# Patient Record
Sex: Female | Born: 1940
Health system: Southern US, Community
[De-identification: ages and names within clinical notes are randomized; demographics above are authoritative.]

## PROBLEM LIST (undated history)

## (undated) DIAGNOSIS — E785 Hyperlipidemia, unspecified: Secondary | ICD-10-CM

## (undated) DIAGNOSIS — T458X5A Adverse effect of other primarily systemic and hematological agents, initial encounter: Secondary | ICD-10-CM

## (undated) DIAGNOSIS — I10 Essential (primary) hypertension: Secondary | ICD-10-CM

## (undated) DIAGNOSIS — F419 Anxiety disorder, unspecified: Secondary | ICD-10-CM

## (undated) DIAGNOSIS — Z85820 Personal history of malignant melanoma of skin: Secondary | ICD-10-CM

## (undated) DIAGNOSIS — T783XXA Angioneurotic edema, initial encounter: Secondary | ICD-10-CM

## (undated) DIAGNOSIS — I7300 Raynaud's syndrome without gangrene: Secondary | ICD-10-CM

## (undated) DIAGNOSIS — K219 Gastro-esophageal reflux disease without esophagitis: Secondary | ICD-10-CM

## (undated) HISTORY — DX: Angioneurotic edema, initial encounter: T78.3XXA

## (undated) HISTORY — DX: Raynaud's syndrome without gangrene: I73.00

## (undated) HISTORY — DX: Adverse effect of other primarily systemic and hematological agents, initial encounter: T45.8X5A

## (undated) HISTORY — DX: Hypercalcemia: E83.52

## (undated) HISTORY — DX: Personal history of malignant melanoma of skin: Z85.820

## (undated) HISTORY — DX: Hyperlipidemia, unspecified: E78.5

## (undated) HISTORY — DX: Gastro-esophageal reflux disease without esophagitis: K21.9

## (undated) HISTORY — DX: Essential (primary) hypertension: I10

## (undated) HISTORY — DX: Anxiety disorder, unspecified: F41.9

## (undated) HISTORY — PX: ABDOMINAL HYSTERECTOMY: SHX81

## (undated) HISTORY — DX: Hypomagnesemia: E83.42

---

## 1998-10-21 ENCOUNTER — Ambulatory Visit (HOSPITAL_COMMUNITY): Admission: RE | Admit: 1998-10-21 | Discharge: 1998-10-21 | Payer: Self-pay | Admitting: Gastroenterology

## 1999-03-07 ENCOUNTER — Emergency Department (HOSPITAL_COMMUNITY): Admission: EM | Admit: 1999-03-07 | Discharge: 1999-03-07 | Payer: Self-pay | Admitting: Emergency Medicine

## 2002-11-10 ENCOUNTER — Emergency Department (HOSPITAL_COMMUNITY): Admission: EM | Admit: 2002-11-10 | Discharge: 2002-11-10 | Payer: Self-pay | Admitting: Emergency Medicine

## 2002-11-17 ENCOUNTER — Encounter: Admission: RE | Admit: 2002-11-17 | Discharge: 2002-11-17 | Payer: Self-pay | Admitting: Infectious Diseases

## 2002-12-08 ENCOUNTER — Encounter: Admission: RE | Admit: 2002-12-08 | Discharge: 2002-12-08 | Payer: Self-pay | Admitting: Internal Medicine

## 2002-12-15 ENCOUNTER — Encounter: Admission: RE | Admit: 2002-12-15 | Discharge: 2002-12-15 | Payer: Self-pay | Admitting: Internal Medicine

## 2002-12-22 ENCOUNTER — Encounter: Admission: RE | Admit: 2002-12-22 | Discharge: 2002-12-22 | Payer: Self-pay | Admitting: Internal Medicine

## 2003-03-17 ENCOUNTER — Encounter: Admission: RE | Admit: 2003-03-17 | Discharge: 2003-03-17 | Payer: Self-pay | Admitting: Infectious Diseases

## 2003-03-19 ENCOUNTER — Encounter: Admission: RE | Admit: 2003-03-19 | Discharge: 2003-03-19 | Payer: Self-pay | Admitting: Internal Medicine

## 2003-03-26 ENCOUNTER — Encounter: Admission: RE | Admit: 2003-03-26 | Discharge: 2003-03-26 | Payer: Self-pay | Admitting: Internal Medicine

## 2003-04-05 ENCOUNTER — Ambulatory Visit (HOSPITAL_COMMUNITY): Admission: RE | Admit: 2003-04-05 | Discharge: 2003-04-05 | Payer: Self-pay | Admitting: Internal Medicine

## 2004-01-18 ENCOUNTER — Encounter: Admission: RE | Admit: 2004-01-18 | Discharge: 2004-01-18 | Payer: Self-pay | Admitting: Internal Medicine

## 2004-02-11 ENCOUNTER — Ambulatory Visit (HOSPITAL_COMMUNITY): Admission: RE | Admit: 2004-02-11 | Discharge: 2004-02-11 | Payer: Self-pay | Admitting: *Deleted

## 2004-03-07 ENCOUNTER — Ambulatory Visit (HOSPITAL_COMMUNITY): Admission: RE | Admit: 2004-03-07 | Discharge: 2004-03-07 | Payer: Self-pay | Admitting: Internal Medicine

## 2004-03-09 ENCOUNTER — Encounter: Admission: RE | Admit: 2004-03-09 | Discharge: 2004-03-09 | Payer: Self-pay | Admitting: Internal Medicine

## 2004-04-26 ENCOUNTER — Ambulatory Visit: Payer: Self-pay | Admitting: Internal Medicine

## 2004-05-10 ENCOUNTER — Ambulatory Visit (HOSPITAL_COMMUNITY): Admission: RE | Admit: 2004-05-10 | Discharge: 2004-05-10 | Payer: Self-pay | Admitting: Internal Medicine

## 2004-05-18 ENCOUNTER — Encounter (INDEPENDENT_AMBULATORY_CARE_PROVIDER_SITE_OTHER): Payer: Self-pay | Admitting: Unknown Physician Specialty

## 2004-05-18 ENCOUNTER — Ambulatory Visit: Payer: Self-pay | Admitting: Internal Medicine

## 2004-05-29 ENCOUNTER — Ambulatory Visit: Payer: Self-pay | Admitting: Internal Medicine

## 2004-09-05 ENCOUNTER — Ambulatory Visit: Payer: Self-pay | Admitting: Internal Medicine

## 2004-09-21 ENCOUNTER — Ambulatory Visit (HOSPITAL_COMMUNITY): Admission: RE | Admit: 2004-09-21 | Discharge: 2004-09-21 | Payer: Self-pay | Admitting: Internal Medicine

## 2004-11-14 ENCOUNTER — Ambulatory Visit: Payer: Self-pay | Admitting: Internal Medicine

## 2004-11-14 ENCOUNTER — Other Ambulatory Visit: Admission: RE | Admit: 2004-11-14 | Discharge: 2004-11-14 | Payer: Self-pay | Admitting: Internal Medicine

## 2004-11-15 DIAGNOSIS — C439 Malignant melanoma of skin, unspecified: Secondary | ICD-10-CM | POA: Insufficient documentation

## 2004-11-21 ENCOUNTER — Ambulatory Visit: Payer: Self-pay | Admitting: Internal Medicine

## 2004-11-22 ENCOUNTER — Other Ambulatory Visit: Admission: RE | Admit: 2004-11-22 | Discharge: 2004-11-22 | Payer: Self-pay | Admitting: Internal Medicine

## 2004-12-05 ENCOUNTER — Ambulatory Visit: Payer: Self-pay | Admitting: Internal Medicine

## 2005-02-16 ENCOUNTER — Ambulatory Visit: Payer: Self-pay | Admitting: Internal Medicine

## 2005-04-10 ENCOUNTER — Ambulatory Visit: Payer: Self-pay | Admitting: Internal Medicine

## 2005-05-11 ENCOUNTER — Encounter (INDEPENDENT_AMBULATORY_CARE_PROVIDER_SITE_OTHER): Payer: Self-pay | Admitting: Unknown Physician Specialty

## 2005-05-11 ENCOUNTER — Ambulatory Visit (HOSPITAL_COMMUNITY): Admission: RE | Admit: 2005-05-11 | Discharge: 2005-05-11 | Payer: Self-pay | Admitting: Internal Medicine

## 2005-07-02 ENCOUNTER — Ambulatory Visit: Payer: Self-pay | Admitting: Internal Medicine

## 2005-07-02 ENCOUNTER — Encounter (INDEPENDENT_AMBULATORY_CARE_PROVIDER_SITE_OTHER): Payer: Self-pay | Admitting: Unknown Physician Specialty

## 2005-07-11 ENCOUNTER — Ambulatory Visit: Payer: Self-pay | Admitting: Internal Medicine

## 2005-11-14 ENCOUNTER — Ambulatory Visit: Payer: Self-pay | Admitting: Internal Medicine

## 2006-06-13 DIAGNOSIS — E785 Hyperlipidemia, unspecified: Secondary | ICD-10-CM | POA: Insufficient documentation

## 2006-06-13 DIAGNOSIS — I10 Essential (primary) hypertension: Secondary | ICD-10-CM | POA: Insufficient documentation

## 2006-06-13 DIAGNOSIS — R12 Heartburn: Secondary | ICD-10-CM | POA: Insufficient documentation

## 2006-06-13 DIAGNOSIS — F419 Anxiety disorder, unspecified: Secondary | ICD-10-CM | POA: Insufficient documentation

## 2006-06-13 DIAGNOSIS — F411 Generalized anxiety disorder: Secondary | ICD-10-CM | POA: Insufficient documentation

## 2006-06-13 DIAGNOSIS — K219 Gastro-esophageal reflux disease without esophagitis: Secondary | ICD-10-CM | POA: Insufficient documentation

## 2006-06-13 DIAGNOSIS — M810 Age-related osteoporosis without current pathological fracture: Secondary | ICD-10-CM | POA: Insufficient documentation

## 2006-06-13 DIAGNOSIS — M818 Other osteoporosis without current pathological fracture: Secondary | ICD-10-CM

## 2006-06-13 HISTORY — DX: Other osteoporosis without current pathological fracture: M81.8

## 2006-08-27 ENCOUNTER — Telehealth (INDEPENDENT_AMBULATORY_CARE_PROVIDER_SITE_OTHER): Payer: Self-pay | Admitting: *Deleted

## 2006-08-27 ENCOUNTER — Encounter (INDEPENDENT_AMBULATORY_CARE_PROVIDER_SITE_OTHER): Payer: Self-pay | Admitting: Unknown Physician Specialty

## 2006-08-27 ENCOUNTER — Ambulatory Visit (HOSPITAL_COMMUNITY): Admission: RE | Admit: 2006-08-27 | Discharge: 2006-08-27 | Payer: Self-pay | Admitting: Internal Medicine

## 2006-09-10 ENCOUNTER — Ambulatory Visit: Payer: Self-pay | Admitting: Internal Medicine

## 2006-09-10 ENCOUNTER — Ambulatory Visit (HOSPITAL_COMMUNITY): Admission: RE | Admit: 2006-09-10 | Discharge: 2006-09-10 | Payer: Self-pay | Admitting: Internal Medicine

## 2006-09-10 ENCOUNTER — Encounter (INDEPENDENT_AMBULATORY_CARE_PROVIDER_SITE_OTHER): Payer: Self-pay | Admitting: Unknown Physician Specialty

## 2006-09-10 LAB — CONVERTED CEMR LAB
ALT: 11 units/L (ref 0–35)
AST: 15 units/L (ref 0–37)
Albumin: 4.8 g/dL (ref 3.5–5.2)
Alkaline Phosphatase: 95 units/L (ref 39–117)
BUN: 20 mg/dL (ref 6–23)
Bilirubin Urine: NEGATIVE
CO2: 25 meq/L (ref 19–32)
Calcium: 10.1 mg/dL (ref 8.4–10.5)
Chloride: 98 meq/L (ref 96–112)
Cholesterol: 200 mg/dL (ref 0–200)
Creatinine, Ser: 1.19 mg/dL (ref 0.40–1.20)
Glucose, Bld: 91 mg/dL (ref 70–99)
HDL: 49 mg/dL (ref >39)
Hemoglobin, Urine: NEGATIVE
Ketones, ur: NEGATIVE mg/dL
LDL Cholesterol: 104 mg/dL — ABNORMAL HIGH (ref 0–99)
Nitrite: NEGATIVE
Potassium: 4.5 meq/L (ref 3.5–5.3)
Protein, ur: NEGATIVE mg/dL
RBC / HPF: NONE SEEN (ref <3)
Sed Rate: 28 mm/hr — ABNORMAL HIGH (ref 0–22)
Sodium: 137 meq/L (ref 135–145)
Specific Gravity, Urine: 1.009 (ref 1.005–1.03)
TSH: 1.905 microintl units/mL (ref 0.350–5.50)
Total Bilirubin: 0.4 mg/dL (ref 0.3–1.2)
Total CHOL/HDL Ratio: 4.1
Total Protein: 8 g/dL (ref 6.0–8.3)
Triglycerides: 233 mg/dL — ABNORMAL HIGH (ref <150)
Urine Glucose: NEGATIVE mg/dL
Urobilinogen, UA: 0.2 (ref 0.0–1.0)
VLDL: 47 mg/dL — ABNORMAL HIGH (ref 0–40)
pH: 7.5 (ref 5.0–8.0)

## 2006-12-19 ENCOUNTER — Ambulatory Visit: Payer: Self-pay | Admitting: Internal Medicine

## 2006-12-19 ENCOUNTER — Encounter (INDEPENDENT_AMBULATORY_CARE_PROVIDER_SITE_OTHER): Payer: Self-pay | Admitting: Unknown Physician Specialty

## 2006-12-19 LAB — CONVERTED CEMR LAB
Cholesterol: 191 mg/dL (ref 0–200)
HDL: 49 mg/dL (ref >39)
LDL Cholesterol: 108 mg/dL — ABNORMAL HIGH (ref 0–99)
Total CHOL/HDL Ratio: 3.9
Triglycerides: 172 mg/dL — ABNORMAL HIGH (ref <150)
VLDL: 34 mg/dL (ref 0–40)

## 2007-01-23 ENCOUNTER — Ambulatory Visit: Payer: Self-pay | Admitting: Internal Medicine

## 2007-01-23 ENCOUNTER — Encounter (INDEPENDENT_AMBULATORY_CARE_PROVIDER_SITE_OTHER): Payer: Self-pay | Admitting: Unknown Physician Specialty

## 2007-01-23 ENCOUNTER — Telehealth: Payer: Self-pay | Admitting: *Deleted

## 2007-01-23 LAB — CONVERTED CEMR LAB
ALT: 14 units/L (ref 0–35)
AST: 21 units/L (ref 0–37)
Albumin: 4.2 g/dL (ref 3.5–5.2)
Alkaline Phosphatase: 97 units/L (ref 39–117)
BUN: 15 mg/dL (ref 6–23)
CO2: 26 meq/L (ref 19–32)
Calcium: 10.2 mg/dL (ref 8.4–10.5)
Chloride: 95 meq/L — ABNORMAL LOW (ref 96–112)
Creatinine, Ser: 1.14 mg/dL (ref 0.40–1.20)
Glucose, Bld: 103 mg/dL — ABNORMAL HIGH (ref 70–99)
Magnesium: 1.6 mg/dL (ref 1.5–2.5)
Phosphorus: 2.9 mg/dL (ref 2.3–4.6)
Potassium: 4.1 meq/L (ref 3.5–5.3)
Sodium: 130 meq/L — ABNORMAL LOW (ref 135–145)
Total Bilirubin: 0.3 mg/dL (ref 0.3–1.2)
Total Protein: 7.2 g/dL (ref 6.0–8.3)

## 2007-01-27 ENCOUNTER — Ambulatory Visit (HOSPITAL_COMMUNITY): Admission: RE | Admit: 2007-01-27 | Discharge: 2007-01-27 | Payer: Self-pay | Admitting: Unknown Physician Specialty

## 2007-01-28 ENCOUNTER — Emergency Department (HOSPITAL_COMMUNITY): Admission: EM | Admit: 2007-01-28 | Discharge: 2007-01-28 | Payer: Self-pay | Admitting: Emergency Medicine

## 2007-01-28 ENCOUNTER — Encounter: Payer: Self-pay | Admitting: Internal Medicine

## 2007-01-28 ENCOUNTER — Telehealth: Payer: Self-pay | Admitting: *Deleted

## 2007-02-12 ENCOUNTER — Encounter: Payer: Self-pay | Admitting: Internal Medicine

## 2007-02-12 ENCOUNTER — Ambulatory Visit: Payer: Self-pay | Admitting: Internal Medicine

## 2007-02-12 DIAGNOSIS — J309 Allergic rhinitis, unspecified: Secondary | ICD-10-CM | POA: Insufficient documentation

## 2007-02-13 LAB — CONVERTED CEMR LAB
Albumin: 4.5 g/dL (ref 3.5–5.2)
BUN: 22 mg/dL (ref 6–23)
CO2: 26 meq/L (ref 19–32)
Calcium: 10.8 mg/dL — ABNORMAL HIGH (ref 8.4–10.5)
Chloride: 89 meq/L — ABNORMAL LOW (ref 96–112)
Creatinine, Ser: 1.32 mg/dL — ABNORMAL HIGH (ref 0.40–1.20)
Glucose, Bld: 99 mg/dL (ref 70–99)
Magnesium: 1.6 mg/dL (ref 1.5–2.5)
Phosphorus: 3.5 mg/dL (ref 2.3–4.6)
Potassium: 4.9 meq/L (ref 3.5–5.3)
Sodium: 129 meq/L — ABNORMAL LOW (ref 135–145)

## 2007-03-20 ENCOUNTER — Encounter: Payer: Self-pay | Admitting: Internal Medicine

## 2007-03-21 ENCOUNTER — Encounter: Payer: Self-pay | Admitting: Internal Medicine

## 2007-03-21 ENCOUNTER — Ambulatory Visit: Payer: Self-pay | Admitting: Infectious Disease

## 2007-03-21 LAB — CONVERTED CEMR LAB
ALT: 13 units/L (ref 0–35)
AST: 16 units/L (ref 0–37)
Albumin: 4.8 g/dL (ref 3.5–5.2)
Alkaline Phosphatase: 87 units/L (ref 39–117)
BUN: 16 mg/dL (ref 6–23)
Basophils Absolute: 0 10*3/uL (ref 0.0–0.1)
Basophils Relative: 0 % (ref 0–1)
CO2: 26 meq/L (ref 19–32)
Calcium: 10.4 mg/dL (ref 8.4–10.5)
Chloride: 95 meq/L — ABNORMAL LOW (ref 96–112)
Creatinine, Ser: 1.05 mg/dL (ref 0.40–1.20)
Eosinophils Absolute: 0.1 10*3/uL (ref 0.0–0.7)
Eosinophils Relative: 1 % (ref 0–5)
Glucose, Bld: 86 mg/dL (ref 70–99)
HCT: 36.2 % (ref 36.0–46.0)
Hemoglobin: 12.1 g/dL (ref 12.0–15.0)
Lymphocytes Relative: 22 % (ref 12–46)
Lymphs Abs: 2.2 10*3/uL (ref 0.7–3.3)
MCHC: 33.4 g/dL (ref 30.0–36.0)
MCV: 87.7 fL (ref 78.0–100.0)
Magnesium: 1.4 mg/dL — ABNORMAL LOW (ref 1.5–2.5)
Monocytes Absolute: 0.6 10*3/uL (ref 0.2–0.7)
Monocytes Relative: 6 % (ref 3–11)
Neutro Abs: 7.1 10*3/uL (ref 1.7–7.7)
Neutrophils Relative %: 71 % (ref 43–77)
Platelets: 412 10*3/uL — ABNORMAL HIGH (ref 150–400)
Potassium: 4.1 meq/L (ref 3.5–5.3)
RBC: 4.13 M/uL (ref 3.87–5.11)
RDW: 13.7 % (ref 11.5–14.0)
Sodium: 133 meq/L — ABNORMAL LOW (ref 135–145)
Total Bilirubin: 0.5 mg/dL (ref 0.3–1.2)
Total Protein: 7.8 g/dL (ref 6.0–8.3)
WBC: 10 10*3/uL (ref 4.0–10.5)

## 2007-05-16 ENCOUNTER — Encounter: Payer: Self-pay | Admitting: Internal Medicine

## 2007-05-16 ENCOUNTER — Ambulatory Visit (HOSPITAL_COMMUNITY): Admission: RE | Admit: 2007-05-16 | Discharge: 2007-05-16 | Payer: Self-pay | Admitting: Internal Medicine

## 2007-05-16 ENCOUNTER — Ambulatory Visit: Payer: Self-pay | Admitting: Internal Medicine

## 2007-05-16 DIAGNOSIS — I7300 Raynaud's syndrome without gangrene: Secondary | ICD-10-CM | POA: Insufficient documentation

## 2007-05-17 ENCOUNTER — Encounter: Payer: Self-pay | Admitting: Internal Medicine

## 2007-05-21 LAB — CONVERTED CEMR LAB
ALT: 11 units/L (ref 0–35)
AST: 15 units/L (ref 0–37)
Albumin: 4.8 g/dL (ref 3.5–5.2)
Alkaline Phosphatase: 82 units/L (ref 39–117)
Anti Nuclear Antibody(ANA): NEGATIVE
BUN: 20 mg/dL (ref 6–23)
Basophils Absolute: 0 10*3/uL (ref 0.0–0.1)
Basophils Relative: 0 % (ref 0–1)
Bilirubin Urine: NEGATIVE
CO2: 23 meq/L (ref 19–32)
Calcium: 10.7 mg/dL — ABNORMAL HIGH (ref 8.4–10.5)
Chloride: 97 meq/L (ref 96–112)
Creatinine, Ser: 1.13 mg/dL (ref 0.40–1.20)
Eosinophils Absolute: 0 10*3/uL (ref 0.0–0.7)
Eosinophils Relative: 0 % (ref 0–5)
Glucose, Bld: 90 mg/dL (ref 70–99)
HCT: 35.6 % — ABNORMAL LOW (ref 36.0–46.0)
Hemoglobin, Urine: NEGATIVE
Hemoglobin: 11.8 g/dL — ABNORMAL LOW (ref 12.0–15.0)
Ketones, ur: NEGATIVE mg/dL
Leukocytes, UA: NEGATIVE
Lymphocytes Relative: 20 % (ref 12–46)
Lymphs Abs: 2.1 10*3/uL (ref 0.7–3.3)
MCHC: 33.1 g/dL (ref 30.0–36.0)
MCV: 87.7 fL (ref 78.0–100.0)
Magnesium: 1.6 mg/dL (ref 1.5–2.5)
Monocytes Absolute: 0.6 10*3/uL (ref 0.2–0.7)
Monocytes Relative: 6 % (ref 3–11)
Neutro Abs: 7.7 10*3/uL (ref 1.7–7.7)
Neutrophils Relative %: 73 % (ref 43–77)
Nitrite: NEGATIVE
Phosphorus: 3.2 mg/dL (ref 2.3–4.6)
Platelets: 394 10*3/uL (ref 150–400)
Potassium: 4.1 meq/L (ref 3.5–5.3)
Protein, ur: NEGATIVE mg/dL
RBC: 4.06 M/uL (ref 3.87–5.11)
RDW: 13.5 % (ref 11.5–14.0)
Rhuematoid fact SerPl-aCnc: <20 intl units/mL (ref 0–20)
Sed Rate: 25 mm/hr — ABNORMAL HIGH (ref 0–22)
Sodium: 135 meq/L (ref 135–145)
Specific Gravity, Urine: 1.009 (ref 1.005–1.03)
Total Bilirubin: 0.4 mg/dL (ref 0.3–1.2)
Total Protein: 8.2 g/dL (ref 6.0–8.3)
Urine Glucose: NEGATIVE mg/dL
Urobilinogen, UA: 0.2 (ref 0.0–1.0)
WBC: 10.4 10*3/uL (ref 4.0–10.5)
pH: 7.5 (ref 5.0–8.0)

## 2007-06-03 ENCOUNTER — Telehealth: Payer: Self-pay | Admitting: Internal Medicine

## 2007-07-10 ENCOUNTER — Telehealth: Payer: Self-pay | Admitting: *Deleted

## 2007-09-11 ENCOUNTER — Encounter: Payer: Self-pay | Admitting: Internal Medicine

## 2007-09-11 ENCOUNTER — Ambulatory Visit: Payer: Self-pay | Admitting: Hospitalist

## 2007-09-16 ENCOUNTER — Ambulatory Visit (HOSPITAL_COMMUNITY): Admission: RE | Admit: 2007-09-16 | Discharge: 2007-09-16 | Payer: Self-pay | Admitting: Internal Medicine

## 2007-09-16 ENCOUNTER — Encounter: Payer: Self-pay | Admitting: Internal Medicine

## 2007-10-10 ENCOUNTER — Encounter: Payer: Self-pay | Admitting: Internal Medicine

## 2007-10-10 ENCOUNTER — Ambulatory Visit: Payer: Self-pay | Admitting: Hospitalist

## 2007-10-10 LAB — CONVERTED CEMR LAB
ALT: 9 units/L (ref 0–35)
AST: 14 units/L (ref 0–37)
Albumin: 4.8 g/dL (ref 3.5–5.2)
Alkaline Phosphatase: 98 units/L (ref 39–117)
BUN: 23 mg/dL (ref 6–23)
Bilirubin Urine: NEGATIVE
CO2: 20 meq/L (ref 19–32)
Calcium: 9.3 mg/dL (ref 8.4–10.5)
Chloride: 103 meq/L (ref 96–112)
Creatinine, Ser: 1.01 mg/dL (ref 0.40–1.20)
Glucose, Bld: 96 mg/dL (ref 70–99)
Hemoglobin, Urine: NEGATIVE
Ketones, ur: NEGATIVE mg/dL
Leukocytes, UA: NEGATIVE
Nitrite: NEGATIVE
Potassium: 4.5 meq/L (ref 3.5–5.3)
Protein, ur: NEGATIVE mg/dL
Sodium: 135 meq/L (ref 135–145)
Specific Gravity, Urine: 1.009 (ref 1.005–1.03)
Total Bilirubin: 0.3 mg/dL (ref 0.3–1.2)
Total Protein: 7.7 g/dL (ref 6.0–8.3)
Urine Glucose: NEGATIVE mg/dL
Urobilinogen, UA: 0.2 (ref 0.0–1.0)
Vit D, 1,25-Dihydroxy: 29 — ABNORMAL LOW (ref 30–89)
pH: 6 (ref 5.0–8.0)

## 2007-10-14 ENCOUNTER — Ambulatory Visit (HOSPITAL_COMMUNITY): Admission: RE | Admit: 2007-10-14 | Discharge: 2007-10-14 | Payer: Self-pay | Admitting: Internal Medicine

## 2007-10-24 ENCOUNTER — Telehealth: Payer: Self-pay | Admitting: Internal Medicine

## 2007-10-30 ENCOUNTER — Telehealth: Payer: Self-pay | Admitting: Internal Medicine

## 2007-12-01 ENCOUNTER — Telehealth: Payer: Self-pay | Admitting: Internal Medicine

## 2008-02-20 ENCOUNTER — Encounter: Payer: Self-pay | Admitting: Internal Medicine

## 2008-02-20 ENCOUNTER — Ambulatory Visit (HOSPITAL_COMMUNITY): Admission: RE | Admit: 2008-02-20 | Discharge: 2008-02-20 | Payer: Self-pay | Admitting: Internal Medicine

## 2008-02-20 ENCOUNTER — Ambulatory Visit: Payer: Self-pay | Admitting: Infectious Diseases

## 2008-02-20 LAB — CONVERTED CEMR LAB
ALT: 15 units/L (ref 0–35)
AST: 20 units/L (ref 0–37)
Albumin: 4.4 g/dL (ref 3.5–5.2)
Alkaline Phosphatase: 75 units/L (ref 39–117)
BUN: 22 mg/dL (ref 6–23)
CO2: 26 meq/L (ref 19–32)
Calcium: 10.2 mg/dL (ref 8.4–10.5)
Chloride: 91 meq/L — ABNORMAL LOW (ref 96–112)
Creatinine, Ser: 1.3 mg/dL — ABNORMAL HIGH (ref 0.40–1.20)
Glucose, Bld: 85 mg/dL (ref 70–99)
Potassium: 4.8 meq/L (ref 3.5–5.3)
Sodium: 126 meq/L — ABNORMAL LOW (ref 135–145)
Total Bilirubin: 0.9 mg/dL (ref 0.3–1.2)
Total Protein: 7.2 g/dL (ref 6.0–8.3)

## 2008-02-23 ENCOUNTER — Ambulatory Visit: Payer: Self-pay | Admitting: *Deleted

## 2008-02-23 ENCOUNTER — Encounter: Payer: Self-pay | Admitting: Internal Medicine

## 2008-02-24 ENCOUNTER — Ambulatory Visit (HOSPITAL_COMMUNITY): Admission: RE | Admit: 2008-02-24 | Discharge: 2008-02-24 | Payer: Self-pay | Admitting: *Deleted

## 2008-02-24 ENCOUNTER — Encounter (INDEPENDENT_AMBULATORY_CARE_PROVIDER_SITE_OTHER): Payer: Self-pay | Admitting: *Deleted

## 2008-03-04 ENCOUNTER — Encounter: Payer: Self-pay | Admitting: Internal Medicine

## 2008-03-10 ENCOUNTER — Telehealth: Payer: Self-pay | Admitting: *Deleted

## 2008-03-15 ENCOUNTER — Encounter (INDEPENDENT_AMBULATORY_CARE_PROVIDER_SITE_OTHER): Payer: Self-pay | Admitting: Internal Medicine

## 2008-03-15 ENCOUNTER — Telehealth (INDEPENDENT_AMBULATORY_CARE_PROVIDER_SITE_OTHER): Payer: Self-pay | Admitting: Internal Medicine

## 2008-03-15 LAB — CONVERTED CEMR LAB
Cholesterol: 232 mg/dL — ABNORMAL HIGH (ref 0–200)
HDL: 44 mg/dL (ref >39)
LDL Cholesterol: 125 mg/dL — ABNORMAL HIGH (ref 0–99)
Total CHOL/HDL Ratio: 5.3
Triglycerides: 313 mg/dL — ABNORMAL HIGH (ref <150)
VLDL: 63 mg/dL — ABNORMAL HIGH (ref 0–40)

## 2008-03-16 ENCOUNTER — Ambulatory Visit: Payer: Self-pay | Admitting: Internal Medicine

## 2008-03-17 ENCOUNTER — Encounter: Payer: Self-pay | Admitting: Internal Medicine

## 2008-03-23 ENCOUNTER — Ambulatory Visit: Payer: Self-pay | Admitting: Internal Medicine

## 2008-03-30 ENCOUNTER — Ambulatory Visit: Payer: Self-pay | Admitting: *Deleted

## 2008-04-16 ENCOUNTER — Ambulatory Visit: Payer: Self-pay | Admitting: Internal Medicine

## 2008-05-14 ENCOUNTER — Encounter: Payer: Self-pay | Admitting: Internal Medicine

## 2008-05-14 ENCOUNTER — Ambulatory Visit: Payer: Self-pay | Admitting: Infectious Disease

## 2008-05-25 LAB — CONVERTED CEMR LAB
ALT: 11 units/L (ref 0–35)
AST: 16 units/L (ref 0–37)
Albumin: 4.4 g/dL (ref 3.5–5.2)
Alkaline Phosphatase: 81 units/L (ref 39–117)
BUN: 27 mg/dL — ABNORMAL HIGH (ref 6–23)
Basophils Absolute: 0 10*3/uL (ref 0.0–0.1)
Basophils Relative: 0 % (ref 0–1)
CO2: 22 meq/L (ref 19–32)
Calcium: 9.8 mg/dL (ref 8.4–10.5)
Chloride: 99 meq/L (ref 96–112)
Creatinine, Ser: 1.36 mg/dL — ABNORMAL HIGH (ref 0.40–1.20)
Eosinophils Absolute: 0 10*3/uL (ref 0.0–0.7)
Eosinophils Relative: 1 % (ref 0–5)
Glucose, Bld: 91 mg/dL (ref 70–99)
HCT: 34.3 % — ABNORMAL LOW (ref 36.0–46.0)
Hemoglobin: 11 g/dL — ABNORMAL LOW (ref 12.0–15.0)
Lymphocytes Relative: 22 % (ref 12–46)
Lymphs Abs: 1.8 10*3/uL (ref 0.7–4.0)
MCHC: 32.1 g/dL (ref 30.0–36.0)
MCV: 91 fL (ref 78.0–100.0)
Monocytes Absolute: 0.6 10*3/uL (ref 0.1–1.0)
Monocytes Relative: 7 % (ref 3–12)
Neutro Abs: 5.5 10*3/uL (ref 1.7–7.7)
Neutrophils Relative %: 70 % (ref 43–77)
Platelets: 350 10*3/uL (ref 150–400)
Potassium: 4.8 meq/L (ref 3.5–5.3)
RBC: 3.77 M/uL — ABNORMAL LOW (ref 3.87–5.11)
RDW: 13.2 % (ref 11.5–15.5)
Sodium: 132 meq/L — ABNORMAL LOW (ref 135–145)
Total Bilirubin: 0.3 mg/dL (ref 0.3–1.2)
Total Protein: 7.3 g/dL (ref 6.0–8.3)
WBC: 7.9 10*3/uL (ref 4.0–10.5)

## 2008-05-31 ENCOUNTER — Ambulatory Visit: Payer: Self-pay | Admitting: Internal Medicine

## 2008-05-31 ENCOUNTER — Encounter: Payer: Self-pay | Admitting: Internal Medicine

## 2008-06-01 LAB — CONVERTED CEMR LAB
BUN: 20 mg/dL (ref 6–23)
CO2: 22 meq/L (ref 19–32)
Calcium: 10.1 mg/dL (ref 8.4–10.5)
Chloride: 103 meq/L (ref 96–112)
Creatinine, Ser: 1.02 mg/dL (ref 0.40–1.20)
Glucose, Bld: 90 mg/dL (ref 70–99)
Potassium: 4.7 meq/L (ref 3.5–5.3)
Sodium: 134 meq/L — ABNORMAL LOW (ref 135–145)

## 2008-07-08 ENCOUNTER — Encounter: Payer: Self-pay | Admitting: Internal Medicine

## 2008-07-08 ENCOUNTER — Ambulatory Visit: Payer: Self-pay | Admitting: Internal Medicine

## 2008-07-08 LAB — CONVERTED CEMR LAB
BUN: 27 mg/dL — ABNORMAL HIGH (ref 6–23)
CO2: 22 meq/L (ref 19–32)
Calcium: 9.9 mg/dL (ref 8.4–10.5)
Chloride: 103 meq/L (ref 96–112)
Creatinine, Ser: 1.26 mg/dL — ABNORMAL HIGH (ref 0.40–1.20)
Glucose, Bld: 99 mg/dL (ref 70–99)
Potassium: 4.7 meq/L (ref 3.5–5.3)
Sodium: 137 meq/L (ref 135–145)

## 2008-10-13 ENCOUNTER — Telehealth: Payer: Self-pay | Admitting: Internal Medicine

## 2008-11-01 ENCOUNTER — Ambulatory Visit: Payer: Self-pay | Admitting: Internal Medicine

## 2008-11-01 ENCOUNTER — Encounter: Payer: Self-pay | Admitting: Internal Medicine

## 2008-11-09 ENCOUNTER — Encounter: Payer: Self-pay | Admitting: Internal Medicine

## 2008-11-09 ENCOUNTER — Ambulatory Visit (HOSPITAL_COMMUNITY): Admission: RE | Admit: 2008-11-09 | Discharge: 2008-11-09 | Payer: Self-pay | Admitting: Internal Medicine

## 2008-11-12 LAB — CONVERTED CEMR LAB
ALT: 13 units/L (ref 0–35)
AST: 18 units/L (ref 0–37)
Albumin: 4.9 g/dL (ref 3.5–5.2)
Alkaline Phosphatase: 101 units/L (ref 39–117)
BUN: 28 mg/dL — ABNORMAL HIGH (ref 6–23)
Basophils Absolute: 0 10*3/uL (ref 0.0–0.1)
Basophils Relative: 0 % (ref 0–1)
CO2: 27 meq/L (ref 19–32)
Calcium: 11.3 mg/dL — ABNORMAL HIGH (ref 8.4–10.5)
Chloride: 98 meq/L (ref 96–112)
Creatinine, Ser: 1.19 mg/dL (ref 0.40–1.20)
Eosinophils Absolute: 0.2 10*3/uL (ref 0.0–0.7)
Eosinophils Relative: 2 % (ref 0–5)
GFR calc Af Amer: 55 mL/min — ABNORMAL LOW (ref >60)
GFR calc non Af Amer: 45 mL/min — ABNORMAL LOW (ref >60)
Glucose, Bld: 103 mg/dL — ABNORMAL HIGH (ref 70–99)
HCT: 36.5 % (ref 36.0–46.0)
Hemoglobin: 12 g/dL (ref 12.0–15.0)
Lymphocytes Relative: 29 % (ref 12–46)
Lymphs Abs: 2.6 10*3/uL (ref 0.7–4.0)
MCHC: 32.9 g/dL (ref 30.0–36.0)
MCV: 87.7 fL (ref 78.0–100.0)
Magnesium: 1.6 mg/dL (ref 1.5–2.5)
Monocytes Absolute: 0.6 10*3/uL (ref 0.1–1.0)
Monocytes Relative: 7 % (ref 3–12)
Neutro Abs: 5.7 10*3/uL (ref 1.7–7.7)
Neutrophils Relative %: 62 % (ref 43–77)
Platelets: 388 10*3/uL (ref 150–400)
Potassium: 4.3 meq/L (ref 3.5–5.3)
RBC: 4.16 M/uL (ref 3.87–5.11)
RDW: 12.7 % (ref 11.5–15.5)
Sodium: 137 meq/L (ref 135–145)
TSH: 1.437 microintl units/mL (ref 0.350–4.500)
Total Bilirubin: 0.4 mg/dL (ref 0.3–1.2)
Total CK: 73 units/L (ref 7–177)
Total Protein: 8.3 g/dL (ref 6.0–8.3)
WBC: 9.2 10*3/uL (ref 4.0–10.5)

## 2008-12-06 ENCOUNTER — Ambulatory Visit: Payer: Self-pay | Admitting: Internal Medicine

## 2009-01-12 ENCOUNTER — Telehealth: Payer: Self-pay | Admitting: Internal Medicine

## 2009-02-23 ENCOUNTER — Telehealth: Payer: Self-pay | Admitting: Internal Medicine

## 2009-03-08 ENCOUNTER — Ambulatory Visit: Payer: Self-pay | Admitting: Internal Medicine

## 2009-03-08 LAB — CONVERTED CEMR LAB
ALT: 11 units/L (ref 0–35)
AST: 17 units/L (ref 0–37)
Albumin: 4.6 g/dL (ref 3.5–5.2)
Alkaline Phosphatase: 82 units/L (ref 39–117)
BUN: 19 mg/dL (ref 6–23)
Basophils Absolute: 0 10*3/uL (ref 0.0–0.1)
Basophils Relative: 0 % (ref 0–1)
CO2: 23 meq/L (ref 19–32)
Calcium: 10.5 mg/dL (ref 8.4–10.5)
Chloride: 95 meq/L — ABNORMAL LOW (ref 96–112)
Cholesterol: 261 mg/dL — ABNORMAL HIGH (ref 0–200)
Creatinine, Ser: 1.12 mg/dL (ref 0.40–1.20)
Eosinophils Absolute: 0.1 10*3/uL (ref 0.0–0.7)
Eosinophils Relative: 1 % (ref 0–5)
Glucose, Bld: 100 mg/dL — ABNORMAL HIGH (ref 70–99)
HCT: 33.6 % — ABNORMAL LOW (ref 36.0–46.0)
HDL: 51 mg/dL (ref >39)
Hemoglobin: 11.1 g/dL — ABNORMAL LOW (ref 12.0–15.0)
LDL Cholesterol: 181 mg/dL — ABNORMAL HIGH (ref 0–99)
Lymphocytes Relative: 27 % (ref 12–46)
Lymphs Abs: 2.3 10*3/uL (ref 0.7–4.0)
MCHC: 33 g/dL (ref 30.0–36.0)
MCV: 89.8 fL (ref >=78.0)
Monocytes Absolute: 0.6 10*3/uL (ref 0.1–1.0)
Monocytes Relative: 7 % (ref 3–12)
Neutro Abs: 5.5 10*3/uL (ref 1.7–7.7)
Neutrophils Relative %: 65 % (ref 43–77)
Platelets: 365 10*3/uL (ref 150–400)
Potassium: 5 meq/L (ref 3.5–5.3)
RBC: 3.74 M/uL — ABNORMAL LOW (ref 3.87–5.11)
RDW: 13.4 % (ref 11.5–15.5)
Sodium: 125 meq/L — ABNORMAL LOW (ref 135–145)
Total Bilirubin: 0.5 mg/dL (ref 0.3–1.2)
Total CHOL/HDL Ratio: 5.1
Total Protein: 7.4 g/dL (ref 6.0–8.3)
Triglycerides: 145 mg/dL (ref <150)
VLDL: 29 mg/dL (ref 0–40)
Vit D, 25-Hydroxy: 32 ng/mL (ref 30–89)
WBC: 8.5 10*3/uL (ref 4.0–10.5)

## 2009-04-21 ENCOUNTER — Telehealth (INDEPENDENT_AMBULATORY_CARE_PROVIDER_SITE_OTHER): Payer: Self-pay | Admitting: *Deleted

## 2009-05-16 ENCOUNTER — Encounter (INDEPENDENT_AMBULATORY_CARE_PROVIDER_SITE_OTHER): Payer: Self-pay | Admitting: Internal Medicine

## 2009-05-16 ENCOUNTER — Ambulatory Visit: Payer: Self-pay | Admitting: Internal Medicine

## 2009-05-16 LAB — CONVERTED CEMR LAB
BUN: 18 mg/dL (ref 6–23)
BUN: 21 mg/dL (ref 6–23)
CO2: 28 meq/L (ref 19–32)
CO2: 26 meq/L (ref 19–32)
Calcium: 10.5 mg/dL (ref 8.4–10.5)
Calcium: 10.6 mg/dL — ABNORMAL HIGH (ref 8.4–10.5)
Chloride: 97 meq/L (ref 96–112)
Chloride: 98 meq/L (ref 96–112)
Creatinine, Ser: 1.2 mg/dL (ref 0.40–1.20)
Creatinine, Ser: 1.25 mg/dL — ABNORMAL HIGH (ref 0.40–1.20)
Ferritin: 72 ng/mL (ref 10–291)
Glucose, Bld: 100 mg/dL — ABNORMAL HIGH (ref 70–99)
Glucose, Bld: 94 mg/dL (ref 70–99)
HCT: 32.9 % — ABNORMAL LOW (ref 36.0–46.0)
HCT: 33.2 % — ABNORMAL LOW (ref 36.0–46.0)
Hemoglobin: 11.2 g/dL — ABNORMAL LOW (ref 12.0–15.0)
Hemoglobin: 11.1 g/dL — ABNORMAL LOW (ref 12.0–15.0)
Iron: 48 ug/dL (ref 42–145)
LDL Goal: 100 mg/dL
MCHC: 33.9 g/dL (ref 30.0–36.0)
MCHC: 33.4 g/dL (ref 30.0–36.0)
MCV: 92.4 fL (ref >=78.0)
MCV: 89.5 fL (ref >=78.0)
Platelets: 305 10*3/uL (ref 150–400)
Platelets: 357 10*3/uL (ref 150–400)
Potassium: 4.6 meq/L (ref 3.5–5.3)
Potassium: 4.6 meq/L (ref 3.5–5.3)
RBC Folate: 1246 ng/mL — ABNORMAL HIGH (ref 180–600)
RBC: 3.56 M/uL — ABNORMAL LOW (ref 3.87–5.11)
RBC: 3.71 M/uL — ABNORMAL LOW (ref 3.87–5.11)
RDW: 13.1 % (ref 11.5–15.5)
RDW: 12.7 % (ref 11.5–15.5)
Saturation Ratios: 16 % — ABNORMAL LOW (ref 20–55)
Sodium: 133 meq/L — ABNORMAL LOW (ref 135–145)
Sodium: 135 meq/L (ref 135–145)
TIBC: 308 ug/dL (ref 250–470)
UIBC: 260 ug/dL
Vitamin B-12: 380 pg/mL (ref 211–911)
WBC: 8.5 10*3/uL (ref 4.0–10.5)
WBC: 8.5 10*3/uL (ref 4.0–10.5)

## 2009-06-20 ENCOUNTER — Ambulatory Visit: Payer: Self-pay | Admitting: Internal Medicine

## 2009-06-20 LAB — CONVERTED CEMR LAB
OCCULT 1: NEGATIVE
OCCULT 2: NEGATIVE
OCCULT 3: NEGATIVE

## 2009-06-20 LAB — FECAL OCCULT BLOOD, GUAIAC: Fecal Occult Blood: NEGATIVE

## 2009-07-05 ENCOUNTER — Telehealth: Payer: Self-pay | Admitting: *Deleted

## 2009-07-26 IMAGING — CR DG KNEE 1-2V*R*
2 series · 2 of 2 positions shown · non-contrast
Comparison: 09/10/06

CLINICAL DATA: 65 year-old with right knee pain and swelling.
 RIGHT KNEE ?2 VIEW:

[t knee ap right]
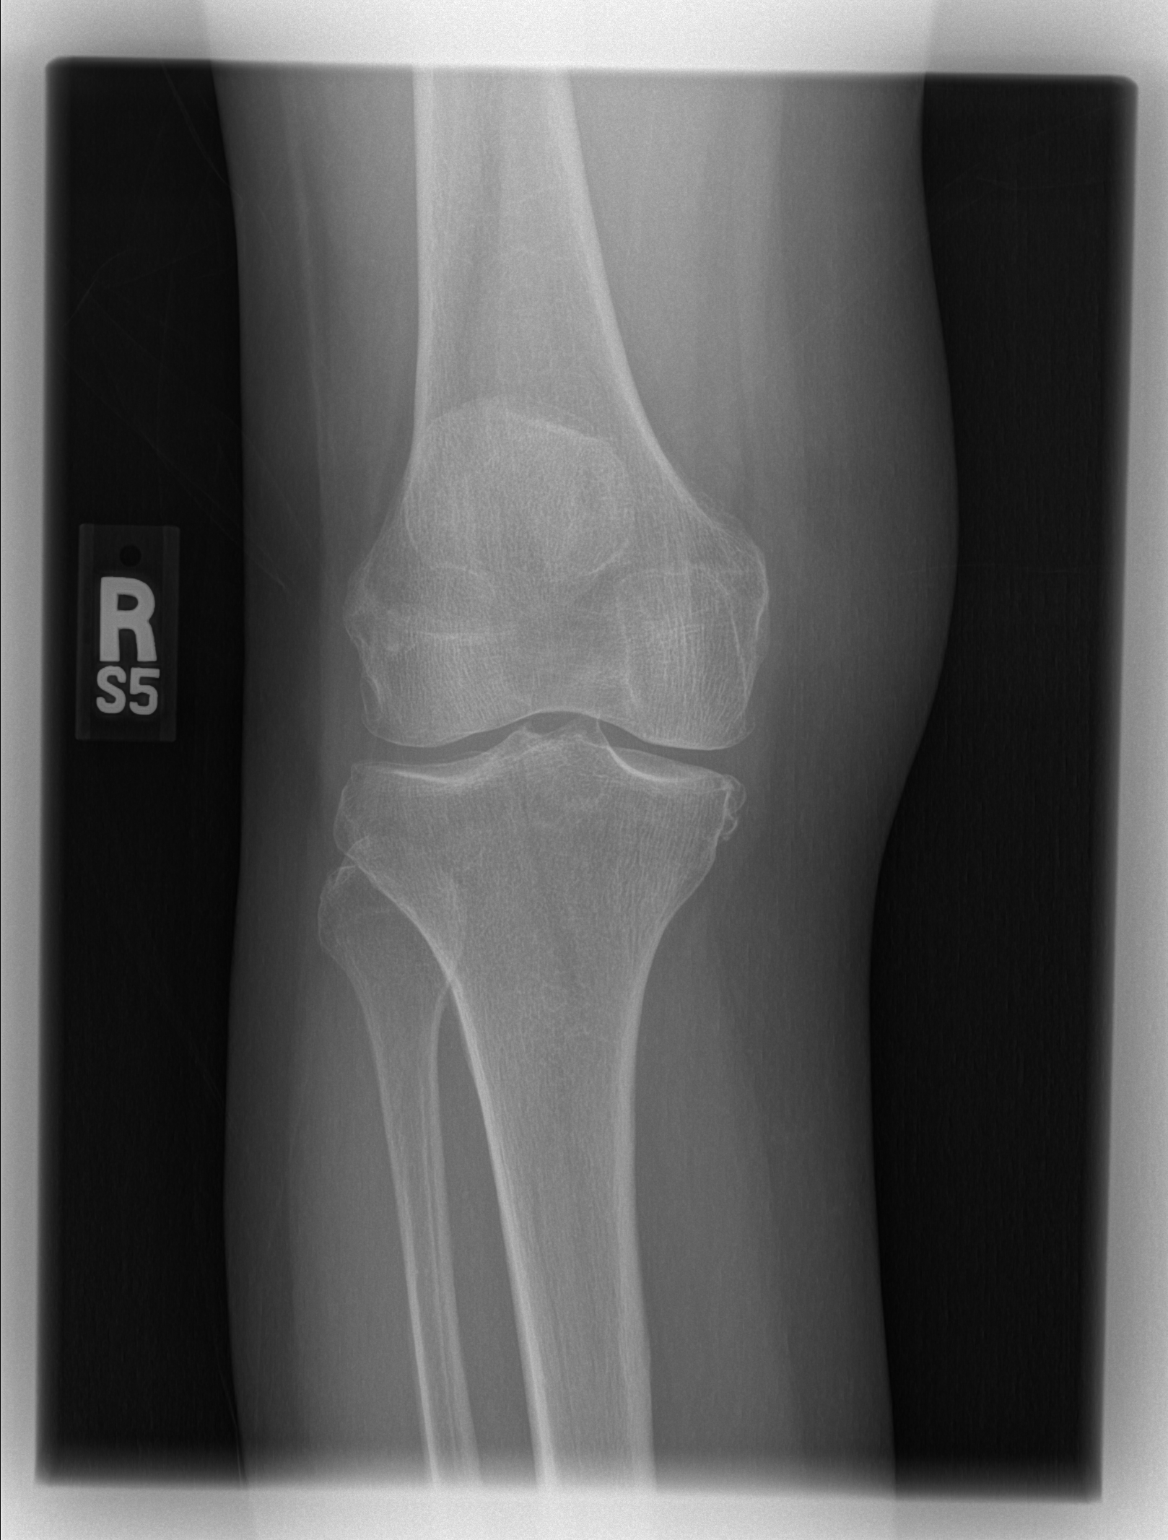

[t knee lat right]
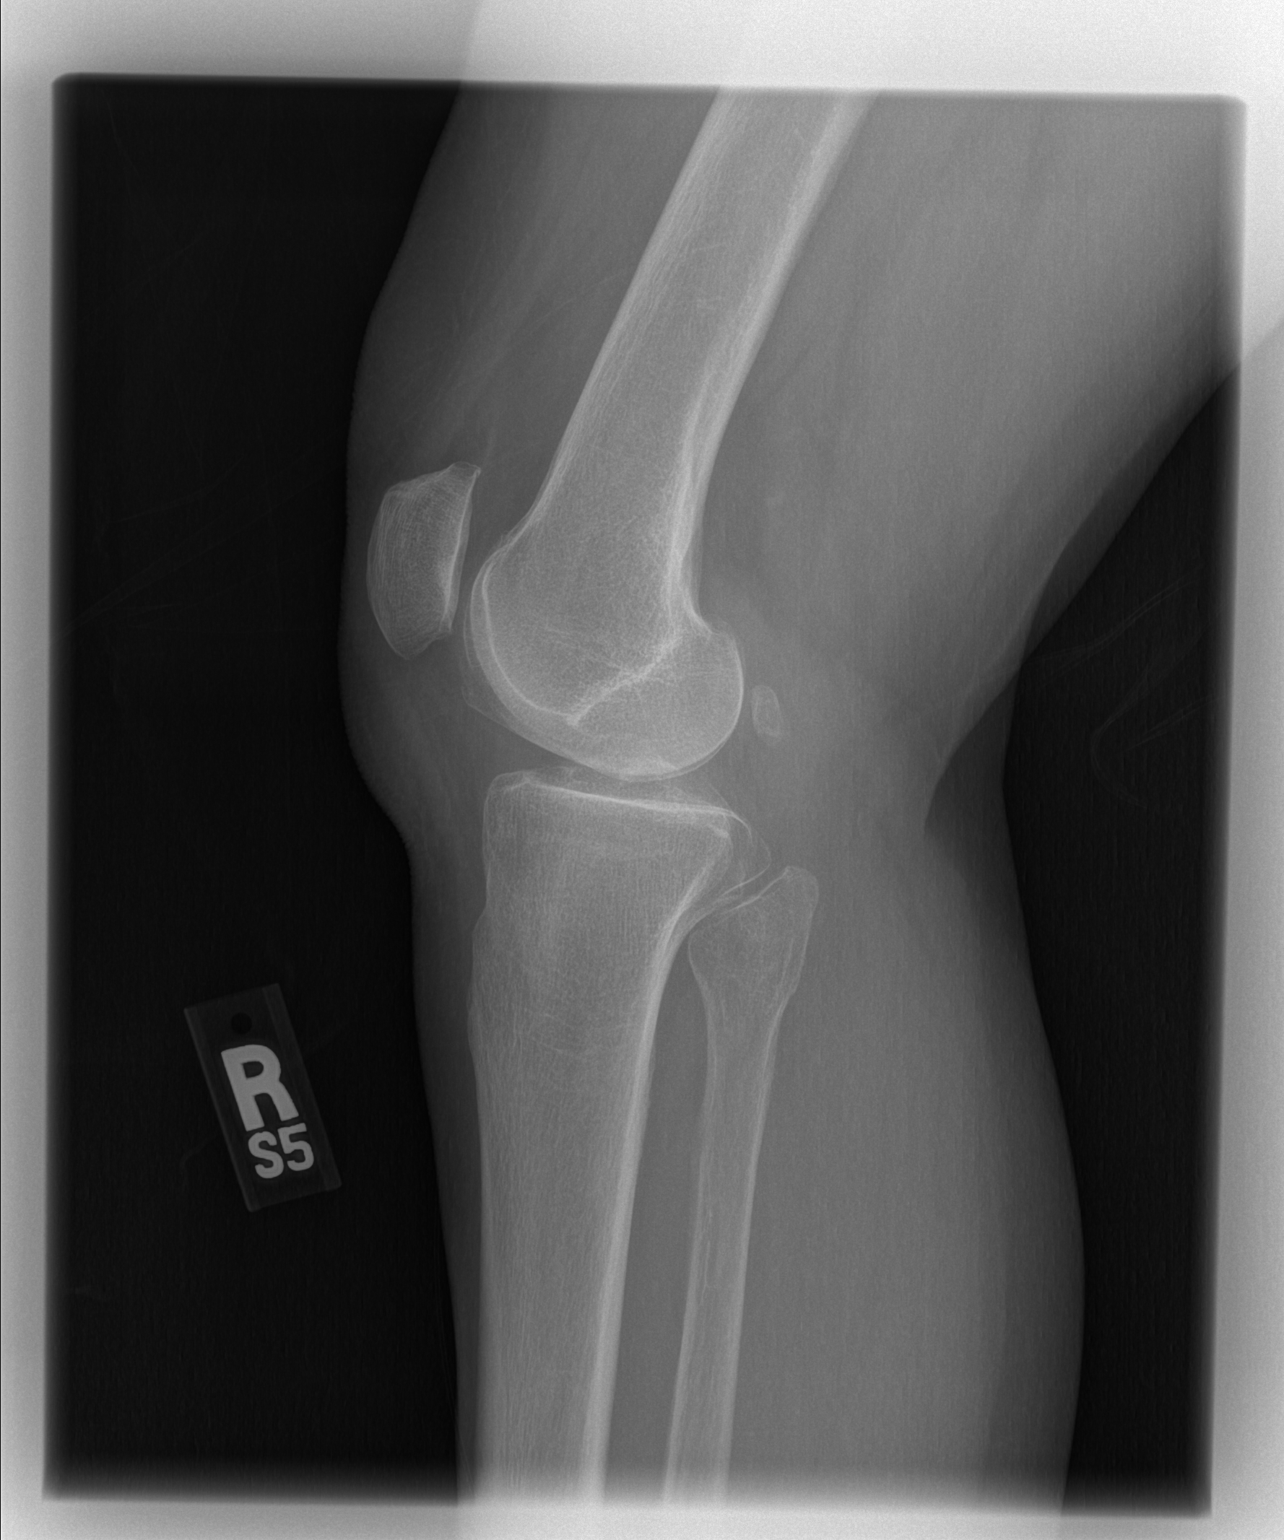

[2 of 2 positions shown; findings below may reference images not displayed]

FINDINGS: There are mild tricompartmental degenerative changes with joint space narrowing and early osteophytic spurring.  Mild diffuse osteoporosis.  No acute bony findings, osteochondral lesions or destructive bony changes.  No joint effusion.
IMPRESSION: 1.  Mild stable tricompartmental changes.  
 2.  Osteoporosis.
 3.  No acute bony findings and no joint effusion.

## 2009-08-25 ENCOUNTER — Ambulatory Visit: Payer: Self-pay | Admitting: Internal Medicine

## 2009-08-26 ENCOUNTER — Encounter: Payer: Self-pay | Admitting: Internal Medicine

## 2009-08-26 LAB — CONVERTED CEMR LAB
ALT: 12 units/L (ref 0–35)
AST: 15 units/L (ref 0–37)
Albumin: 4.4 g/dL (ref 3.5–5.2)
Alkaline Phosphatase: 78 units/L (ref 39–117)
BUN: 22 mg/dL (ref 6–23)
Basophils Absolute: 0 10*3/uL (ref 0.0–0.1)
Basophils Relative: 0 % (ref 0–1)
Bilirubin, Direct: 0.1 mg/dL (ref 0.0–0.3)
CO2: 25 meq/L (ref 19–32)
Calcium: 9.6 mg/dL (ref 8.4–10.5)
Chloride: 101 meq/L (ref 96–112)
Cholesterol: 200 mg/dL (ref 0–200)
Creatinine, Ser: 1.04 mg/dL (ref 0.40–1.20)
Eosinophils Absolute: 0.1 10*3/uL (ref 0.0–0.7)
Eosinophils Relative: 1 % (ref 0–5)
Glucose, Bld: 99 mg/dL (ref 70–99)
HCT: 35.9 % — ABNORMAL LOW (ref 36.0–46.0)
HDL: 39 mg/dL — ABNORMAL LOW (ref >39)
Hemoglobin: 11.5 g/dL — ABNORMAL LOW (ref 12.0–15.0)
Indirect Bilirubin: 0.3 mg/dL (ref 0.0–0.9)
LDL Cholesterol: 123 mg/dL — ABNORMAL HIGH (ref 0–99)
Lymphocytes Relative: 22 % (ref 12–46)
Lymphs Abs: 2.4 10*3/uL (ref 0.7–4.0)
MCHC: 32 g/dL (ref 30.0–36.0)
MCV: 93.2 fL (ref >=78.0)
Monocytes Absolute: 0.5 10*3/uL (ref 0.1–1.0)
Monocytes Relative: 5 % (ref 3–12)
Neutro Abs: 7.6 10*3/uL (ref 1.7–7.7)
Neutrophils Relative %: 71 % (ref 43–77)
Platelets: 404 10*3/uL — ABNORMAL HIGH (ref 150–400)
Potassium: 4.8 meq/L (ref 3.5–5.3)
RBC: 3.85 M/uL — ABNORMAL LOW (ref 3.87–5.11)
RDW: 13.1 % (ref 11.5–15.5)
Sodium: 134 meq/L — ABNORMAL LOW (ref 135–145)
Total Bilirubin: 0.4 mg/dL (ref 0.3–1.2)
Total CHOL/HDL Ratio: 5.1
Total Protein: 7.3 g/dL (ref 6.0–8.3)
Triglycerides: 192 mg/dL — ABNORMAL HIGH (ref <150)
VLDL: 38 mg/dL (ref 0–40)
WBC: 10.6 10*3/uL — ABNORMAL HIGH (ref 4.0–10.5)

## 2010-01-06 ENCOUNTER — Ambulatory Visit (HOSPITAL_COMMUNITY): Admission: RE | Admit: 2010-01-06 | Discharge: 2010-01-06 | Payer: Self-pay | Admitting: Internal Medicine

## 2010-01-06 LAB — HM MAMMOGRAPHY: HM Mammogram: NEGATIVE

## 2010-01-12 ENCOUNTER — Ambulatory Visit: Payer: Self-pay | Admitting: Internal Medicine

## 2010-01-13 ENCOUNTER — Encounter: Payer: Self-pay | Admitting: Internal Medicine

## 2010-01-17 DIAGNOSIS — D518 Other vitamin B12 deficiency anemias: Secondary | ICD-10-CM | POA: Insufficient documentation

## 2010-01-17 LAB — CONVERTED CEMR LAB
BUN: 26 mg/dL — ABNORMAL HIGH (ref 6–23)
Basophils Absolute: 0 10*3/uL (ref 0.0–0.1)
Basophils Relative: 0 % (ref 0–1)
CO2: 23 meq/L (ref 19–32)
Calcium: 9.5 mg/dL (ref 8.4–10.5)
Chloride: 102 meq/L (ref 96–112)
Cholesterol: 237 mg/dL — ABNORMAL HIGH (ref 0–200)
Creatinine, Ser: 1.09 mg/dL (ref 0.40–1.20)
Eosinophils Absolute: 0.2 10*3/uL (ref 0.0–0.7)
Eosinophils Relative: 2 % (ref 0–5)
Ferritin: 90 ng/mL (ref 10–291)
Glucose, Bld: 89 mg/dL (ref 70–99)
HCT: 35.2 % — ABNORMAL LOW (ref 36.0–46.0)
HDL: 49 mg/dL (ref >39)
Hemoglobin: 11.4 g/dL — ABNORMAL LOW (ref 12.0–15.0)
LDL Cholesterol: 159 mg/dL — ABNORMAL HIGH (ref 0–99)
Lymphocytes Relative: 23 % (ref 12–46)
Lymphs Abs: 2.1 10*3/uL (ref 0.7–4.0)
MCHC: 32.4 g/dL (ref 30.0–36.0)
MCV: 90.3 fL (ref >=78.0)
Monocytes Absolute: 0.6 10*3/uL (ref 0.1–1.0)
Monocytes Relative: 6 % (ref 3–12)
Neutro Abs: 6.6 10*3/uL (ref 1.7–7.7)
Neutrophils Relative %: 70 % (ref 43–77)
Platelets: 347 10*3/uL (ref 150–400)
Potassium: 4.3 meq/L (ref 3.5–5.3)
RBC: 3.9 M/uL (ref 3.87–5.11)
RDW: 13.4 % (ref 11.5–15.5)
Sodium: 136 meq/L (ref 135–145)
TSH: 1.534 microintl units/mL (ref 0.350–4.5)
Total CHOL/HDL Ratio: 4.8
Triglycerides: 147 mg/dL (ref <150)
VLDL: 29 mg/dL (ref 0–40)
Vitamin B-12: 245 pg/mL (ref 211–911)
WBC: 9.5 10*3/uL (ref 4.0–10.5)

## 2010-01-25 ENCOUNTER — Ambulatory Visit: Payer: Self-pay | Admitting: Internal Medicine

## 2010-01-25 ENCOUNTER — Telehealth: Payer: Self-pay | Admitting: Internal Medicine

## 2010-02-24 ENCOUNTER — Ambulatory Visit: Payer: Self-pay | Admitting: Internal Medicine

## 2010-03-27 ENCOUNTER — Ambulatory Visit: Payer: Self-pay | Admitting: Internal Medicine

## 2010-04-27 ENCOUNTER — Ambulatory Visit: Payer: Self-pay | Admitting: Internal Medicine

## 2010-06-01 ENCOUNTER — Ambulatory Visit: Payer: Self-pay | Admitting: Internal Medicine

## 2010-06-07 ENCOUNTER — Ambulatory Visit: Payer: Self-pay | Admitting: Internal Medicine

## 2010-06-07 LAB — CONVERTED CEMR LAB
BUN: 21 mg/dL (ref 6–23)
CO2: 26 meq/L (ref 19–32)
Calcium: 10.3 mg/dL (ref 8.4–10.5)
Chloride: 97 meq/L (ref 96–112)
Cholesterol: 259 mg/dL — ABNORMAL HIGH (ref 0–200)
Creatinine, Ser: 1.1 mg/dL (ref 0.40–1.20)
Glucose, Bld: 94 mg/dL (ref 70–99)
HDL: 51 mg/dL (ref >39)
LDL Cholesterol: 168 mg/dL — ABNORMAL HIGH (ref 0–99)
Potassium: 4.3 meq/L (ref 3.5–5.3)
Sodium: 136 meq/L (ref 135–145)
TSH: 1.437 microintl units/mL (ref 0.350–4.5)
Total CHOL/HDL Ratio: 5.1
Triglycerides: 201 mg/dL — ABNORMAL HIGH (ref <150)
VLDL: 40 mg/dL (ref 0–40)
Vit D, 25-Hydroxy: 40 ng/mL (ref 30–89)
Vitamin B-12: 834 pg/mL (ref 211–911)

## 2010-06-27 ENCOUNTER — Ambulatory Visit: Payer: Self-pay | Admitting: Internal Medicine

## 2010-07-27 ENCOUNTER — Ambulatory Visit: Payer: Self-pay | Admitting: Internal Medicine

## 2010-08-28 ENCOUNTER — Ambulatory Visit: Admission: RE | Admit: 2010-08-28 | Discharge: 2010-08-28 | Payer: Self-pay | Source: Home / Self Care

## 2010-09-03 LAB — CONVERTED CEMR LAB
ALT: 12 units/L (ref 0–35)
AST: 18 units/L (ref 0–37)
Albumin: 5.2 g/dL (ref 3.5–5.2)
Alkaline Phosphatase: 100 units/L (ref 39–117)
BUN: 20 mg/dL (ref 6–23)
Basophils Absolute: 0 10*3/uL (ref 0.0–0.1)
Basophils Relative: 0 % (ref 0–1)
Bilirubin Urine: NEGATIVE
CO2: 23 meq/L (ref 19–32)
Calcium, Total (PTH): 10.7 mg/dL — ABNORMAL HIGH (ref 8.4–10.5)
Calcium: 11 mg/dL — ABNORMAL HIGH (ref 8.4–10.5)
Chloride: 99 meq/L (ref 96–112)
Creatinine, Ser: 1.16 mg/dL (ref 0.40–1.20)
Eosinophils Absolute: 0 10*3/uL (ref 0.0–0.7)
Eosinophils Relative: 0 % (ref 0–5)
Glucose, Bld: 86 mg/dL (ref 70–99)
HCT: 37.9 % (ref 36.0–46.0)
Hemoglobin, Urine: NEGATIVE
Hemoglobin: 12.5 g/dL (ref 12.0–15.0)
Ketones, ur: NEGATIVE mg/dL
Lymphocytes Relative: 20 % (ref 12–46)
Lymphs Abs: 2.1 10*3/uL (ref 0.7–4.0)
MCHC: 33 g/dL (ref 30.0–36.0)
MCV: 89.6 fL (ref 78.0–100.0)
Monocytes Absolute: 0.6 10*3/uL (ref 0.1–1.0)
Monocytes Relative: 5 % (ref 3–12)
Neutro Abs: 7.7 10*3/uL (ref 1.7–7.7)
Neutrophils Relative %: 74 % (ref 43–77)
Nitrite: NEGATIVE
PTH: 12.8 pg/mL — ABNORMAL LOW (ref 14.0–72.0)
Platelets: 404 10*3/uL — ABNORMAL HIGH (ref 150–400)
Potassium: 4.4 meq/L (ref 3.5–5.3)
Protein, ur: NEGATIVE mg/dL
RBC / HPF: NONE SEEN (ref <3)
RBC: 4.23 M/uL (ref 3.87–5.11)
RDW: 13.3 % (ref 11.5–15.5)
Sodium: 139 meq/L (ref 135–145)
Specific Gravity, Urine: 1.007 (ref 1.005–1.03)
Total Bilirubin: 0.5 mg/dL (ref 0.3–1.2)
Total Protein: 8.6 g/dL — ABNORMAL HIGH (ref 6.0–8.3)
Urine Glucose: NEGATIVE mg/dL
Urobilinogen, UA: 0.2 (ref 0.0–1.0)
WBC: 10.5 10*3/uL (ref 4.0–10.5)
pH: 6.5 (ref 5.0–8.0)

## 2010-09-05 NOTE — Assessment & Plan Note (Signed)
Summary: FU VISIT/DS   Vital Signs:  Patient profile:   70 year old female Height:      58.5 inches (148.59 cm) Weight:      124 pounds (56.36 kg) BMI:     25.57 Temp:     98 degrees F (36.67 degrees C) oral Pulse rate:   83 / minute BP sitting:   147 / 74  (right arm) Cuff size:   small  Vitals Entered By: Angelina Ok RN (June 07, 2010 1:36 PM) CC: Depression [name] Pain Assessment Patient in pain? no      Nutritional Status BMI of 25 - 29 = overweight  Have you ever been in a relationship where you felt threatened, hurt or afraid?No   Does patient need assistance? Functional Status Self care Ambulation Normal Comments Pt is taking Zantac 150 mg daily.  Is it ok to take with her other meds.  Pepcid is off the counter.  wants a Flu shot.  Needsrefill on Clonazepam 0.5 mg.   Primary Care Provider:  Julaine Fusi  DO  CC:  Depression.  History of Present Illness: Ms. Ingham is doing well today. No complaints. Needs medication refills and flu shot today.  Depression History:      The patient denies a depressed mood most of the day and a diminished interest in her usual daily activities.         Preventive Screening-Counseling & Management  Alcohol-Tobacco     Smoking Status: quit > 6 months     Smoking Cessation Counseling: yes     Packs/Day: once weekly, 1 cigarette     Year Quit: 2007  Allergies: 1)  ! Niacin (Niacin) 2)  ! Reclast (Zoledronic Acid) 3)  ! * Statin 4)  Adhesive Bandages Plastic (Adhesive Bandages)  Social History: Smoking Status:  quit > 6 months  Physical Exam  General:  alert, well-developed, well-nourished, well-hydrated, appropriate dress, normal appearance, healthy-appearing, cooperative to examination, and good hygiene.   Neck:  No deformities, masses, or tenderness noted. Lungs:  Normal respiratory effort, chest expands symmetrically. Lungs are clear to auscultation, no crackles or wheezes. Heart:  Grade 2/6  SEM.unchanged and RRR Abdomen:  soft, non-tender, and no masses.   Msk:  normal ROM, no joint tenderness, and no joint swelling.   Extremities:  trace left pedal edema and trace right pedal edema.   Neurologic:  alert & oriented X3, cranial nerves II-XII intact, strength normal in all extremities, and sensation intact to light touch.   Skin:  no suspicious lesions.     Impression & Recommendations:  Problem # 1:  ANEMIA, VITAMIN B12 DEFICIENCY (ICD-281.1) IM injection last week. Will recheck levels today. Her updated medication list for this problem includes:    Cyanocobalamin 1000 Mcg/ml Soln (Cyanocobalamin) ..... 1000mg  im q monthly  Orders: T-Vitamin B12 ([id number])  Problem # 2:  GERD (ICD-530.81) Doing well on ranitidine- could not afford omeprazole but doing just as well on new medication OTC.  Her updated medication list for this problem includes:    Ranitidine Hcl 150 Mg Caps (Ranitidine hcl) .Marland Kitchen... Take 1 tablet by mouth once a day  Problem # 3:  HYPERTENSION (ICD-401.9) Has not taken all of her meds today. Close to goal. Will check TSH. Her updated medication list for this problem includes:    Lisinopril 40 Mg Tabs (Lisinopril) .Marland Kitchen... Take 1 tablet by mouth once a day    Hydrochlorothiazide 25 Mg Tabs (Hydrochlorothiazide) .Marland Kitchen... Take 1  tablet by mouth once a day    Carvedilol 12.5 Mg Tabs (Carvedilol) .Marland Kitchen... Take 1 tablet by mouth two times a day  Orders: T-Vitamin B12 ([id number]) T-Basic Metabolic Panel ([id number]) T-TSH (87564-33295)  Problem # 4:  HYPERLIPIDEMIA (ICD-272.4) Statin intolerant-muscle pains and extremity swelling for multiple statins. Will check lipid panel today.  The following medications were removed from the medication list:    Lovastatin 10 Mg Tabs (Lovastatin) .Marland Kitchen... Take 1 tablet by mouth once a day  Orders: T-Vitamin B12 ([id number]) T-Lipid Profile (01601-09323)  Problem # 5:  ANXIETY (ICD-300.00) Refilled medication today. Doing  well takes as needed only. No red flags or risk factors for misuse. Her updated medication list for this problem includes:    Clonazepam 0.5 Mg Tabs (Clonazepam) .Marland Kitchen... Take 1 to 1/2 tablet twice a day as needed for anxiety  Complete Medication List: 1)  Lisinopril 40 Mg Tabs (Lisinopril) .... Take 1 tablet by mouth once a day 2)  Hydrochlorothiazide 25 Mg Tabs (Hydrochlorothiazide) .... Take 1 tablet by mouth once a day 3)  Ranitidine Hcl 150 Mg Caps (Ranitidine hcl) .... Take 1 tablet by mouth once a day 4)  Clonazepam 0.5 Mg Tabs (Clonazepam) .... Take 1 to 1/2 tablet twice a day as needed for anxiety 5)  Aspirin 81 Mg Tabs (Aspirin) .... Take 1 tablet by mouth once a day 6)  Oscal 500/200 D-3 Tabs (Calcium-vitamin d tabs) .... Take 1 tablet by mouth once a day 7)  Carvedilol 12.5 Mg Tabs (Carvedilol) .... Take 1 tablet by mouth two times a day 8)  Omega-3 & Omega-6 Fish Oil Caps (Omega 3-6-9 fatty acids) .... Take 1 tablet by mouth once a day 9)  Multivitamins Tabs (Multiple vitamin) .... Take 1 tablet by mouth once a day 10)  Benefiber Tabs (Wheat dextrin) .... Take 1 tablet by mouth two times a day or as directed 11)  Vitamin D3 400 Unit Tabs (Cholecalciferol) .... Take 1 tablet by mouth two times a day 12)  Cyanocobalamin 1000 Mcg/ml Soln (Cyanocobalamin) .... 1000mg  im q monthly 13)  Extra Strength Acetaminophen 500 Mg Caps (Acetaminophen) .... Take 1-2 tablets as needed daily for headaches  Other Orders: T-Vitamin D (25-Hydroxy) ([id number])  Patient Instructions: 1)  Please schedule a follow-up appointment in 3-4 months. Prescriptions: CLONAZEPAM 0.5 MG  TABS (CLONAZEPAM) Take 1 to 1/2 tablet twice a day as needed for anxiety  #30 x 5   Entered and Authorized by:   Julaine Fusi  DO   Signed by:   Julaine Fusi  DO on 06/07/2010   Method used:   Print then Give to Patient   RxID:   [id number]    Orders Added: 1)  T-Vitamin B12 [[id number]] 2)  Est. Patient Level IV  [16073] 3)  T-Vitamin D (25-Hydroxy) [71062-69485] 4)  T-Lipid Profile [80061-22930] 5)  T-Basic Metabolic Panel [[id number]] 6)  T-TSH [[id number]]    Prevention & Chronic Care Immunizations   Influenza vaccine: Fluvax 3+  (05/16/2009)   Influenza vaccine deferral: Deferred  (01/12/2010)   Influenza vaccine due: 04/06/2010    Tetanus booster: Not documented    Pneumococcal vaccine: Not documented    H. zoster vaccine: Not documented  Colorectal Screening   Hemoccult: Not documented   Hemoccult action/deferral: Deferred  (08/25/2009)    Colonoscopy: Not documented  Other Screening   Pap smear: Not documented   Pap smear action/deferral: Not indicated S/P hysterectomy  (05/16/2009)    Mammogram: ASSESSMENT: Negative - BI-RADS 1^MM DIGITAL SCREENING  (  01/06/2010)   Mammogram action/deferral: mammogram ordered  (11/01/2008)   Mammogram due: 11/09/2009    DXA bone density scan: Not documented   DXA scan due: 09/2008    Smoking status: quit > 6 months  (06/07/2010)  Lipids   Total Cholesterol: 237  (01/13/2010)   Lipid panel action/deferral: Lipid Panel ordered   LDL: 159  (01/13/2010)   LDL Direct: Not documented   HDL: 49  (01/13/2010)   Triglycerides: 147  (01/13/2010)    SGOT (AST): 15  (08/26/2009)   BMP action: Ordered   SGPT (ALT): 12  (08/26/2009)   Alkaline phosphatase: 78  (08/26/2009)   Total bilirubin: 0.4  (08/26/2009)  Hypertension   Last Blood Pressure: 147 / 74  (06/07/2010)   Serum creatinine: 1.09  (01/13/2010)   Serum potassium 4.3  (01/13/2010)  Self-Management Support :   Personal Goals (by the next clinic visit) :      Personal blood pressure goal: 130/80  (05/16/2009)     Personal LDL goal: 100  (05/16/2009)    Patient will work on the following items until the next clinic visit to reach self-care goals:     Medications and monitoring: take my medicines every day, bring all of my medications to every visit  (06/07/2010)      Eating: drink diet soda or water instead of juice or soda, eat more vegetables, use fresh or frozen vegetables, eat foods that are low in salt, eat baked foods instead of fried foods, eat fruit for snacks and desserts, limit or avoid alcohol  (06/07/2010)     Activity: take a 30 minute walk every day  (06/07/2010)    Hypertension self-management support: Written self-care plan, Education handout, Pre-printed educational material, Resources for patients handout  (06/07/2010)   Hypertension self-care plan printed.   Hypertension education handout printed    Lipid self-management support: Written self-care plan, Education handout, Pre-printed educational material, Resources for patients handout  (06/07/2010)   Lipid self-care plan printed.   Lipid education handout printed      Resource handout printed.   Nursing Instructions: Give Flu vaccine today     Vital Signs:  Patient profile:   69 year old female Height:      58.5 inches (148.59 cm) Weight:      124 pounds (56.36 kg) BMI:     25.57 Temp:     98 degrees F (36.67 degrees C) oral Pulse rate:   83 / minute BP sitting:   147 / 74  (right arm) Cuff size:   small  Vitals Entered By: Angelina Ok RN (June 07, 2010 1:36 PM)    Appended Document: FU VISIT/DS   Immunizations Administered:  Influenza Vaccine # 1:    Vaccine Type: Fluvax MCR    Site: right deltoid    Mfr: GlaxoSmithK[employer]    Dose: 0.5 ml    Route: IM    Given by: Emerson Monte, SN/Gladys Herbin, RN    Exp. Date: 02/03/2011    Lot #: [id number]    VIS given: 02/28/10 version given June 07, 2010.  Flu Vaccine Consent Questions:    Do you have a history of severe allergic reactions to this vaccine? no    Any prior history of allergic reactions to egg and/or gelatin? no    Do you have a sensitivity to the preservative Thimersol? no    Do you have a past history of Guillan-Barre Syndrome? no    Do you currently have an acute febrile  illness? no     Have you ever had a severe reaction to latex? no    Vaccine information given and explained to patient? yes    Are you currently pregnant? no

## 2010-09-05 NOTE — Assessment & Plan Note (Signed)
Summary: FU VISIT/VS   Vital Signs:  Patient Profile:   70 Years Old Female Weight:      120.7 pounds (54.86 kg) Temp:     97.0 degrees F (36.11 degrees C) oral Pulse rate:   78 / minute BP sitting:   164 / 77  (left arm)  Pt. in pain?   yes    Location:   right thigh    Intensity:   4  Vitals Entered By: Krystal Eaton Duncan Dull) (January 23, 2007 8:53 AM)              [name]  Have you ever been in a relationship where you felt threatened, hurt or afraid?No   Does patient need assistance? Functional Status Self care Ambulation Normal   PCP:  Artist Beach  Chief Complaint:  lipid results, swelling in right thigh area, and med refill.  History of Present Illness: Elizabeth Woodward is a 70yr old Caucasian female with h/o HTN, Osteoporosis, GERD and anxiety who presents today for a follow up visit. She was interested in knowing the results of her cholesterol test and refill for Pravastatin.  She says she had noticed that the right thigh swells up on and off. It is not related to activity, posture. She hasn't had trauma. But continues to have occ morning stiffness for few mins and then clears up. The 'knot' on the right side of her chest which she had complained of last visit has resolved now. Occ she has pain. She does not remember being hurt, or lifting wts.  She has decided to go ahead and get IV Zoledronic acid for her bones. She was worried if that might worsen her joint pain.   Current Allergies: ! NIACIN (NIACIN)   Family History:    Mother: 'Heart problem'    Father: Atherosclerosis    Brother: Colon Ca and Lung cancer at 68yrs    Sister: Esophagus cancer.    Risk Factors: Tobacco use:  quit    Year quit:  2007  Mammogram History:    Date of Last Mammogram:  08/27/2006    Physical Exam  General:     alert, well-nourished, and well-hydrated.  Anxious appearing Head:     normocephalic.   Eyes:     vision grossly intact, pupils equal, pupils  round, and pupils reactive to light.   Mouth:     pharynx pink and moist and poor dentition.   Neck:     supple, no thyromegaly, and no JVD.   Lungs:     normal respiratory effort, normal breath sounds, no dullness, no crackles, and no wheezes.   Heart:     normal rate, regular rhythm, no murmur, no gallop, and no rub.   Abdomen:     soft, non-tender, and normal bowel sounds.   Msk:     I could not appreciate any swelling in the rt inguinal region or the rt thigh. Some varicose veins visible throughout that limb, but no swelling. Pulses:     +2 bil DP Extremities:     cold and clammy. No edema Neurologic:     non focal Psych:     Anxious    Impression & Recommendations:  Problem # 1:  OSTEOPOROSIS (ICD-733.00) Pt has decided to go ahead and schedule for IV Reclast. Will go ahead and check a  BMET and Mg and Phosphorous level as per protocol. Her updated medication list for this problem includes:    Oscal 500/200 D-3 Tabs (  Calcium-vitamin d tabs) .Marland Kitchen... Three times a day    Reclast 5 Mg/151ml Soln (Zoledronic acid) .Marland Kitchen... To be give iv at [facility] short stay on 01/27/07  Orders: T-Magnesium [phone]) T-Phosphorus [phone])  BMET is normal. Phosphorous is normal at 2.9 and Mg is low at 1.6 will go ahead and start pt on MGO 400mg   bid   Problem # 2:  KNEE PAIN (ICD-719.46) and Rt hip pain persistent on and off. Ass with mild occ morning stiffness for a few mins. X-rays done previously in 09/2006 showed mild degenerative disease, but nothing significant. She has not noticed any more 'swellings' on her skin. Her ESR when last checked in 09/2006 was 28. I considered a possibility of CREST syndrome, but seems less likely. But one can mornitor her for that. She is on Aspirin already. If required the only change would be changing her BP med to CCB to help with above. But currently no signs of sclerodactyl or renal probs. Her updated medication list for this problem includes:    Aspirin  81 Mg Tabs (Aspirin) .Marland Kitchen... Take 1 tablet by mouth once a day   Problem # 3:  MELANOMA (ICD-172.9) Pt was referred to Dermatologist and had apt on 11/14/06, but could not keep it due to financial issues. She does have Medicare but would be required to pay 20% upfront. Now it is rescheduled to 01/31/07, and she says she can keep that one. This is to follow up for Dysplastic nevus/Melanoma follow up which was excised in 2005.  Problem # 4:  VENOUS INSUFFICIENCY (ICD-459.81) Not an issue now.  Problem # 5:  HYPERTENSION (ICD-401.9) Rpt BP is 144/78. She says she has been under stress due to her friend passing away recently. She had known him for a long time and is kind of sad because of his sudden demise. Her updated medication list for this problem includes:    Lisinopril 40 Mg Tabs (Lisinopril) .Marland Kitchen... Take 1 tablet by mouth once a day    Hydrochlorothiazide 25 Mg Tabs (Hydrochlorothiazide) .Marland Kitchen... Take 1 tablet by mouth once a day    Clonidine Hcl 0.1 Mg Tabs (Clonidine hcl) .Marland Kitchen... Take 1 tablet by mouth two times a day  Orders: T-Comprehensive Metabolic Panel ([id number])   Problem # 6:  HYPERLIPIDEMIA (ICD-272.4) Informed the pt of FLP results. Continue Pravastatin due to good progress. Her updated medication list for this problem includes:    Pravastatin Sodium 40 Mg Tabs (Pravastatin sodium) .Marland Kitchen... Take 1 tablet by mouth once a day  Will go ahead and check a CMET today.  Orders: T-Comprehensive Metabolic Panel ([id number])   Problem # 7:  Preventive Health Care (ICD-V70.0) Uptodate with FLP, Pap smear and Mammogram. With the family h/o Colon Ca, it is imp tht we keep up with the screening. She had a Colonoscopy I believe in 2002 and needed rpt in 2007. But she is not able to get it due to outstanding account with [facility]. For now will give her stool cards but have explained to her how imp it is to get it done.  Medications Added to Medication List This Visit: 1)  Reclast 5 Mg/135ml  Soln (Zoledronic acid) .... To be give iv at [facility] short stay on 01/27/07 2)  Magnesium Oxide 400 Mg Caps (Magnesium oxide) .... Take 1 tablet by mouth two times a day   Patient Instructions: 1)  Please schedule a follow-up appointment in 1 month. 2)  Limit your Sodium (Salt). 3)  It is important that  you exercise regularly at least 20 minutes 5 times a week.  4)  Keep your apt with the Dermatologist. 5)  Keep in mind that you need to get a colonoscopy as early as possible. Prescriptions: MAGNESIUM OXIDE 400 MG  CAPS (MAGNESIUM OXIDE) Take 1 tablet by mouth two times a day  #60 x 2   Entered and Authorized by:   Artist Beach MD   Signed by:   Artist Beach MD on 01/23/2007   Method used:   Print then Give to Patient   RxID:   [id number] PRAVASTATIN SODIUM 40 MG TABS (PRAVASTATIN SODIUM) Take 1 tablet by mouth once a day  #30 x 6   Entered and Authorized by:   Artist Beach MD   Signed by:   Artist Beach MD on 01/23/2007   Method used:   Print then Give to Patient   RxID:   [id number]  Patient has appointment with Short Stay on January 27, 2007 at 11:00 am.  Orders tubed to Short Stay to the attention of Lavern.  Appointment given to patient in had. Lela Sturdivnat NTII

## 2010-09-05 NOTE — Assessment & Plan Note (Signed)
Summary: CHECKUP/ SB.   Vital Signs:  Patient Profile:   70 Years Old Female Height:     58.5 inches (148.59 cm) Weight:      121.7 pounds (55.32 kg) BMI:     25.09 Temp:     97.2 degrees F (36.22 degrees C) oral Pulse rate:   99 / minute BP sitting:   172 / 87  (right arm)  Pt. in pain?   no  Vitals Entered By: Stanton Kidney Ditzler RN (September 11, 2007 1:49 PM)              [name] Nutritional Status BMI of 25 - 29 = overweight Nutritional Status Detail good  Have you ever been in a relationship where you felt threatened, hurt or afraid?denies   Does patient need assistance? Functional Status Self care Ambulation Normal     PCP:  [facility]  Chief Complaint:  FU - no change. ? next bone density..  History of Present Illness: Elizabeth Woodward is a 70 year old woman with osteoporosis, hypertension, and presumed osteoarthritis who presents to the office today for routine follow-up. Several months ago she recieved reclast injections for her Oseoporosis and had a serious reaction including profuse vomiting, flu-like symptoms, and diarrhea. She is since much better symptomatically but we are closely monitoring her electrolytes including her mag, phos, calcium and well as her renal and liver function.   Today she reports feeling better. Her knee has improved with daily exercise and tylenol. She complains of a feeling of fullness in her lower abdomen and thinks she has some swelling in her groin. This has been going on for "years" according to her and she has had ultrasound of her ovaries, is status post hysterectomy and has in teh past had a CT scan that is normal.   She denies any night sweats, weight loss, or fevers. She does have Raynauds/peripheral vasospasm in cold weather with no know cause. She denies chest pain, SOB. Recently she says she was standing in front of a space heater and burned her calves without knowing it.     Note: [phone] son's phone number-can  leave message for patient.  Current Allergies (reviewed today): ! NIACIN (NIACIN) ! RECLAST (ZOLEDRONIC ACID)    Risk Factors: Tobacco use:  quit    Year quit:  2007  Mammogram History:    Date of Last Mammogram:  08/27/2006   Review of Systems      See HPI   Physical Exam  General:     No distress Eyes:     sclera clear Lungs:     CTAB Heart:     RRR no murmurs Abdomen:     Soft, Non-Tender, no distinct lymph nodes in groin, some scar tissue palpated along an old phanenstiel inscision. No edema or swelling. No masses. Msk:     Knee joints are non-tender, no effusion, some mild valgus deformity, heberdens nodes and DIP deformity bilateral phalanges Pulses:     2+ Neurologic:     Hyper-reflexive. No proximal muscle weakness. strength normal in all extremities, sensation intact to light touch, gait normal, and finger-to-nose normal.   Psych:     appropriate    Impression & Recommendations:  Problem # 1:  OSTEOPOROSIS (ICD-733.00) Moderate disease on prior DEXA. Patient had an Allergic Reaction to Reclast several months ago. She is due for a repeat DEXA this month for interval follow. She takes Oscal two times a day - given her calcium levels  I have asked her cut back her dosage to once daily- I am concerned for D vitaminosis since she is hypercalcemic. She may also have a primary disorder of bone metabolism or hyperparathyroidism.  Plan- Dexa this month.  Her updated medication list for this problem includes:    Oscal 500/200 D-3 Tabs (Calcium-vitamin d tabs) .Marland Kitchen... Three times a day    Problem # 2:  RAYNAUD'S SYNDROME (ICD-443.0) Fingers are cool and blue today. Resolved toward the end of our visit. Autoimmune w/u negative on prior visit. Likely just peripheral idiopathic vasospasm.  Problem # 3:  HYPERCALCEMIA (ICD-275.42) This finding is quite concerning today. She has a progressive hypercalcemia and I am unsure if this is related to a bone process or  secondary to another process which is causing premature osteoporosis as a consequence. She has in teh past r/o for autoimmune/inflammatory disease. I am now more suspicious of Vitamin D tox, sarcoidosis or an underlying malignancy. Today I will initiate a work up- in terms of labs to see if a more clear etiology can be determined. Symptomatically she is hyperreflexive, has abdominal pain, and non-specific joint pains.  Will repeat Ca, CMET TSH normal recently. ESR and ANA negative. Will check a PTH level for primary hyperthyroidism. Will also obtain a UA to look for protienuria.  Depending on these lab results I will continue a complete work-up to include: -SPEP, UPEP for myeloma -cortisol to r/o addisons -r/o milk alkali syndrome - check an ace level for sarcoidosis - will also consider obtaining a Chest CT to evaluate for lung cancer and possible paraneoplastic syndrome  In the mean, time I have asked her to cut back on her Os-Cal to once daily.  Orders: T- * Misc. Laboratory test [phone]) T-Comprehensive Metabolic Panel [id number]) T-CBC w/Diff [phone]) T-Urinalysis ([id number]) Dexa scan (Dexa scan) Mammogram (Screening) (Mammo)   Problem # 4:  HYPOMAGNESEMIA (ICD-275.2) Essentially resolved -will repeat today.  Problem # 5:  TOBACCO ABUSE (ICD-305.1) Assessment: Improved Patient denies smoking and reports having quit completely.  Problem # 6:  KNEE PAIN (ICD-719.46) Resolved. Her updated medication list for this problem includes:    Aspirin 81 Mg Tabs (Aspirin) .Marland Kitchen... Take 1 tablet by mouth once a day   Problem # 7:  ABDOMINAL PAIN OTHER SPECIFIED SITE (ICD-789.09) Lower pelvis- feeling of fullness, exam benign. Past studies negative for ovarian mass or other neoplasm. May be constipation or urinary problem.  Will check UA for infection or blood.  Her updated medication list for this problem includes:    Aspirin 81 Mg Tabs (Aspirin) .Marland Kitchen... Take 1 tablet by mouth  once a day   Complete Medication List: 1)  Lisinopril 40 Mg Tabs (Lisinopril) .... Take 1 tablet by mouth once a day 2)  Hydrochlorothiazide 25 Mg Tabs (Hydrochlorothiazide) .... Take 1 tablet by mouth once a day 3)  Pepcid Complete Chew (Famotidine-ca carb-mag hydrox chew) .... Take 1 tablet by mouth once a day 4)  Clonidine Hcl 0.1 Mg Tabs (Clonidine hcl) .... Take 1 tablet by mouth two times a day 5)  Clonazepam 0.5 Mg Tabs (Clonazepam) .... Take 1 to 1/2 tablet twice a day as needed for anxiety 6)  Aspirin 81 Mg Tabs (Aspirin) .... Take 1 tablet by mouth once a day 7)  Oscal 500/200 D-3 Tabs (Calcium-vitamin d tabs) .... Three times a day 8)  Pravastatin Sodium 40 Mg Tabs (Pravastatin sodium) .... Take 1 tablet by mouth once a day 9)  Magnesium Oxide 400 Mg Caps (Magnesium  oxide) .... Take 1 tablet by mouth two times a day   Patient Instructions: 1)  Please schedule a follow-up appointment in 2 weeks for BP Check, and follow up lab work.    ]

## 2010-09-05 NOTE — Assessment & Plan Note (Signed)
Summary: B-12 INJ/CFB  Nurse Visit   Allergies: 1)  ! Niacin (Niacin) 2)  ! Reclast (Zoledronic Acid) 3)  ! * Statin 4)  Adhesive Bandages Plastic (Adhesive Bandages)  Medication Administration  Injection # 1:    Medication: Vit B12 1000 mcg    Diagnosis: ANEMIA, VITAMIN B12 DEFICIENCY (ICD-281.1)    Route: IM    Site: L deltoid    Exp Date: 4/13    Lot #: [id number]    Mfr: APP    Patient tolerated injection without complications    Given by: Merrie Roof RN (January 25, 2010 9:59 AM)  Orders Added: 1)  Vit B12 1000 mcg [J3420] 2)  Admin of Therapeutic Inj  intramuscular or subcutaneous [[id number]]

## 2010-09-05 NOTE — Progress Notes (Signed)
Summary: refill/gg  Phone Note Refill Request  on October 30, 2007 11:17 AM  Refills Requested: Medication #1:  CLONAZEPAM 0.5 MG  TABS Take 1 to 1/2 tablet twice a day as needed for anxiety   Last Refilled: 10/01/2007 Initial call taken by: Merrie Roof RN,  October 30, 2007 11:19 AM  Follow-up for Phone Call        Refill approved-nurse to complete Follow-up by: Julaine Fusi  DO,  November 03, 2007 9:24 PM  Additional Follow-up for Phone Call Additional follow up Details #1::        Rx called to pharmacy Additional Follow-up by: Angelina Ok RN,  November 04, 2007 9:37 AM      Prescriptions: CLONAZEPAM 0.5 MG  TABS (CLONAZEPAM) Take 1 to 1/2 tablet twice a day as needed for anxiety  #30 x 4   Entered and Authorized by:   Julaine Fusi  DO   Signed by:   Julaine Fusi  DO on 11/03/2007   Method used:   Electronically sent to ...       [facility] [address] [id number]       [address]       [place], [place]  [address]       Ph: [phone] or [phone]       Fax: [phone]   RxID:   [id number]

## 2010-09-05 NOTE — Progress Notes (Signed)
Summary: walk-in  Phone Note Call from Patient   Summary of Call: Pt stopped by clinic with c/o abscess to shoulder area x 2 weeks.  Area is red and swollen. will see tomorrow AM. Please call in antibiotics to [facility] on [address]. Initial call taken by: Merrie Roof RN,  March 15, 2008 11:36 AM  Follow-up for Phone Call        Agree. Follow-up by: Ned Grace MD,  March 15, 2008 11:42 AM

## 2010-09-05 NOTE — Progress Notes (Signed)
Summary: Magnesium Prescription  Phone Note Outgoing Call   Call placed by: Angelina Ok RN,  January 23, 2007 4:57 PM Call placed to: Patient Summary of Call: Call to pt to notify of need to start Magnesium Oxide.  Msg left for pt to call clinics and ask for either Gladys or Lela.  Angelina Ok, RN January 23, 2007 4:47 PM RTC from pt informed of need to start Magnesium Oxide 400 mg 1 tablet two times a day by mouth per order of Dr. Allena Katz.  Pt voiced understanding of new order.  Prescription for Magnesiuum Oxide 400 mg 1 by mouth two times a day # 60 with 2 refills per order of Dr. Allena Katz called to [facility] at [phone]. Angelina Ok, RN January 23, 2007 5:11 PM Initial call taken by: Angelina Ok, RN January 23, 2007 4:48 PM

## 2010-09-05 NOTE — Assessment & Plan Note (Signed)
Summary: EST-4WEEK RECHECK/CH   Vital Signs:  Patient Profile:   70 Years Old Female Height:     58.5 inches (148.59 cm) Weight:      119.6 pounds (54.36 kg) BMI:     24.66 Temp:     97.5 degrees F oral BP sitting:   164 / 71  (right arm)  Pt. in pain?   no  Vitals Entered By: Filomena Jungling (March 21, 2007 11:35 AM)              [name] Nutritional Status BMI of 19 -24 = normal  Does patient need assistance? Functional Status Self care Ambulation Normal   PCP:  [facility]  Chief Complaint:  CHECK-UP-NEED REFILLS.  History of Present Illness: Ms Elizabeth Woodward is a 70yr old Caucasian female with h/o HTN, Osteoporosis, GERD and anxiety who presents today for a follow up visit.  She recently recieved IV Reclast, and had side effects that inluded flu like symptoms and significant nausea and vomitiing- these have since resolved. She has historically had low magnesium level.  She is here to have repeat blood work and for medication refills.  Current Allergies: ! NIACIN (NIACIN) ! RECLAST (ZOLEDRONIC ACID)  Past Medical History:    Reviewed history from 09/10/2006 and no changes required:       Anxiety       GERD       Hyperlipidemia 224/245/42/133 4/07, repeat this visit       Hypertension       Osteoporosis - pt on Oscal + Vit D, repeat Bone scan next visit, if still osteoporotic start Fosamax if tolerable.       Angioedema - Sec to Niacin       Venous insufficiency       Hyponatremia - around 133       Tobacco abuse       Melanoma, mole in back       DEXA scan -osteoporosis on f/u DEXA in 1/ 2008    Risk Factors: Tobacco use:  quit    Year quit:  2007  Mammogram History:    Date of Last Mammogram:  08/27/2006   Review of Systems  The patient denies fever, weight loss, and chest pain.     Physical Exam  General:     alert, well-nourished, and well-hydrated.  Anxious appearing Lungs:     normal respiratory effort, normal breath sounds, no  dullness, no crackles, and no wheezes.   Heart:     normal rate, regular rhythm, no murmur, no gallop, and no rub.   Abdomen:     soft, non-tender, and normal bowel sounds.   Msk:     degenerative joint chages noted.    Impression & Recommendations:  Problem # 1:  HYPOMAGNESEMIA (ICD-275.2) Recheck lab and continue replacement.  Orders: T-Magnesium ([id number])   Problem # 2:  HYPONATREMIA (ICD-276.1) Repeat BMET. Likely transient secondary to volume losses w/ GI symptoms. Orders: T-CBC w/Diff ([id number])   Problem # 3:  OSTEOPOROSIS (ICD-733.00) Continue w/ Calcium supplementation. Her updated medication list for this problem includes:    Oscal 500/200 D-3 Tabs (Calcium-vitamin d tabs) .Marland Kitchen... Three times a day    Reclast 5 Mg/185ml Soln (Zoledronic acid) .Marland Kitchen... To be give iv at [facility] short stay on 01/27/07   Problem # 4:  ANXIETY (ICD-300.00) Refilled medication. Patient using as directed. Effective low dose.  Her updated medication list for this problem includes:    Clonazepam 0.5 Mg Tabs (  Clonazepam) .Marland Kitchen... Take 1 to 1/2 tablet twice a day as needed for anxiety   Problem # 5:  HYPERLIPIDEMIA (ICD-272.4) Stable. Her updated medication list for this problem includes:    Pravastatin Sodium 40 Mg Tabs (Pravastatin sodium) .Marland Kitchen... Take 1 tablet by mouth once a day  Orders: T-Comprehensive Metabolic Panel ([id number])   Problem # 6:  HYPERTENSION (ICD-401.9) Stable. No change. Her updated medication list for this problem includes:    Lisinopril 40 Mg Tabs (Lisinopril) .Marland Kitchen... Take 1 tablet by mouth once a day    Hydrochlorothiazide 25 Mg Tabs (Hydrochlorothiazide) .Marland Kitchen... Take 1 tablet by mouth once a day    Clonidine Hcl 0.1 Mg Tabs (Clonidine hcl) .Marland Kitchen... Take 1 tablet by mouth two times a day   Complete Medication List: 1)  Lisinopril 40 Mg Tabs (Lisinopril) .... Take 1 tablet by mouth once a day 2)  Hydrochlorothiazide 25 Mg Tabs (Hydrochlorothiazide) .... Take 1  tablet by mouth once a day 3)  Pepcid Complete Chew (Famotidine-ca carb-mag hydrox chew) .... Take 1 tablet by mouth once a day 4)  Clonidine Hcl 0.1 Mg Tabs (Clonidine hcl) .... Take 1 tablet by mouth two times a day 5)  Clonazepam 0.5 Mg Tabs (Clonazepam) .... Take 1 to 1/2 tablet twice a day as needed for anxiety 6)  Aspirin 81 Mg Tabs (Aspirin) .... Take 1 tablet by mouth once a day 7)  Oscal 500/200 D-3 Tabs (Calcium-vitamin d tabs) .... Three times a day 8)  Pravastatin Sodium 40 Mg Tabs (Pravastatin sodium) .... Take 1 tablet by mouth once a day 9)  Reclast 5 Mg/112ml Soln (Zoledronic acid) .... To be give iv at [facility] short stay on 01/27/07 10)  Magnesium Oxide 400 Mg Caps (Magnesium oxide) .... Take 1 tablet by mouth two times a day   Patient Instructions: 1)  Please schedule a follow-up appointment in 1 month.    Prescriptions: CLONAZEPAM 0.5 MG  TABS (CLONAZEPAM) Take 1 to 1/2 tablet twice a day as needed for anxiety  #30 x 3   Entered and Authorized by:   Julaine Fusi  DO   Signed by:   Julaine Fusi  DO on 03/24/2007   Method used:   Print then Give to Patient   RxID:   [id number]

## 2010-09-05 NOTE — Progress Notes (Signed)
Summary: Lab resultsand Xray  Phone Note Outgoing Call   Call placed to: Patient Summary of Call: Spoke w/Ms. [place] about her lab results and xray results. Nothing acute. Will consider rheum referal at next visit. Questions addressed. She may discontinue Magnesium supplement and continue multivitamin +D. ..................................................................Marland KitchenJulaine Fusi  DO  June 03, 2007 2:20 PM

## 2010-09-05 NOTE — Assessment & Plan Note (Signed)
Summary: boil/gg   Vital Signs:  Patient Profile:   70 Years Old Female Height:     58.5 inches (148.59 cm) Weight:      122.9 pounds BMI:     25.34 Temp:     97.5 degrees F oral Pulse rate:   116 / minute BP sitting:   178 / 89  (right arm)  Pt. in pain?   yes    Location:   shoulder    Intensity:   5    Type:       aching  Vitals Entered ByFilomena Jungling NT II (March 16, 2008 10:37 AM)              [name] Nutritional Status BMI of 25 - 29 = overweight  Have you ever been in a relationship where you felt threatened, hurt or afraid?No   Does patient need assistance? Functional Status Self care Ambulation Normal     PCP:  [facility]  Chief Complaint:  BOIL ON SHOULDER X 2 WEEKS.  History of Present Illness: Boil -   present for 2 weeks; doesnot recall a wound or biteor cut. No fever or chills. Swelling actually a little bit down compared to 48 hours ago   Taking BP meds except skipped this AM  TIA sx -    note normal MRA, and some mild white matter abnormality / small vessel disease on MRI. ECHO also unremarkable except mild MR and slightly high pulm pressure (SBP 34); Taking ASA, no focal numbness sx as described before..    Current Allergies: ! NIACIN (NIACIN) ! RECLAST (ZOLEDRONIC ACID)    Risk Factors: Tobacco use:  quit    Year quit:  2007  Mammogram History:    Date of Last Mammogram:  08/27/2006    Physical Exam  Eyes:     sclera clear Skin:     4 cm vertical, 3 cm horizontal erythematous, fluctuent mass. Well circumscribed oval.    Impression & Recommendations:  Problem # 1:  ABSCESS, SKIN (ICD-682.9)  Her updated medication list for this problem includes:    Doxycycline Hyclate 100 Mg Tabs (Doxycycline hyclate) .Marland Kitchen... Take 1 tablet by mouth two times a day  Orders: I&D Abscess, Simple / Single (10060) - patient prepped and draped in usual steril manner. A 1cm incision was made with a #15 sclpel then 10cc of  purulent drainage was obtained.  At the end of the procedure the area was flat without fluctulence. There was a 1.5 cm necrotic undermining center. All puss was expressed. T-Culture, Wound ([id number])   Problem # 2:  HYPERTENSION (ICD-401.9) SBP high today but missed meds. recheck at next visit. Her updated medication list for this problem includes:    Lisinopril 40 Mg Tabs (Lisinopril) .Marland Kitchen... Take 1 tablet by mouth once a day    Hydrochlorothiazide 25 Mg Tabs (Hydrochlorothiazide) .Marland Kitchen... Take 1 tablet by mouth once a day    Clonidine Hcl 0.1 Mg Tabs (Clonidine hcl) .Marland Kitchen... Take 1 tablet by mouth two times a day   Problem # 3:  follow-up in one week recheck abscess from todays 4 X3 cm oval size  recheck BP  Complete Medication List: 1)  Lisinopril 40 Mg Tabs (Lisinopril) .... Take 1 tablet by mouth once a day 2)  Hydrochlorothiazide 25 Mg Tabs (Hydrochlorothiazide) .... Take 1 tablet by mouth once a day 3)  Pepcid Complete Chew (Famotidine-ca carb-mag hydrox chew) .... Take 1 tablet by mouth once a day 4)  Clonidine Hcl 0.1 Mg Tabs (Clonidine hcl) .... Take 1 tablet by mouth two times a day 5)  Clonazepam 0.5 Mg Tabs (Clonazepam) .... Take 1 to 1/2 tablet twice a day as needed for anxiety 6)  Aspirin 81 Mg Tabs (Aspirin) .... Take 1 tablet by mouth once a day 7)  Oscal 500/200 D-3 Tabs (Calcium-vitamin d tabs) .... Three times a day 8)  Pravastatin Sodium 40 Mg Tabs (Pravastatin sodium) .... Take 1 tablet by mouth once a day 9)  Magnesium Oxide 400 Mg Caps (Magnesium oxide) .... Take 1 tablet by mouth two times a day 10)  Doxycycline Hyclate 100 Mg Tabs (Doxycycline hyclate) .... Take 1 tablet by mouth two times a day   Patient Instructions: 1)  follow up 1 week 2)  call before then if high fever or back feels worse 3)  wash wound with soap and water twice a day and dress with bacitracin and gauze pad.   ]

## 2010-09-05 NOTE — Assessment & Plan Note (Signed)
Summary: 1WK FU/GOLDING/VS   Vital Signs:  Patient Profile:   70 Years Old Female Height:     58.5 inches (148.59 cm) Weight:      121.1 pounds (55.05 kg) BMI:     24.97 Temp:     97.6 degrees F (36.44 degrees C) oral Pulse rate:   85 / minute BP sitting:   142 / 77  (right arm)  Pt. in pain?   no  Vitals Entered By: Stanton Kidney Ditzler RN (March 30, 2008 2:39 PM)              [name] Nutritional Status BMI of 19 -24 = normal Nutritional Status Detail appetite good  Have you ever been in a relationship where you felt threatened, hurt or afraid?denies   Does patient need assistance? Functional Status Self care Ambulation Normal     PCP:  Julaine Fusi  DO  Chief Complaint:  FU - feeling better. Sister doing better.Marland Kitchen  History of Present Illness: Elizabeth Woodward is a pleasant 70 y/o woman with HTN,HLPD who was recently seen for a L scapular abscess that grew Staph epi. She underwent I+D in the clinic and completed an antibiotic course of doxy (10 day course started on 8/10 and a 5 day course started on 8/18). Healing well, no drainage, no tenderness. Still changing dressing and washing twice a day. Pt mentions that her skin is irritated where she hadthe medical tape applied. No fevers or chills, no further abscesses.  Her sister is doing better (was extubated today) after undergoing an esophageal cancer resection.     Prior Medication List:  LISINOPRIL 40 MG TABS (LISINOPRIL) Take 1 tablet by mouth once a day HYDROCHLOROTHIAZIDE 25 MG TABS (HYDROCHLOROTHIAZIDE) Take 1 tablet by mouth once a day PEPCID COMPLETE  CHEW (FAMOTIDINE-CA CARB-MAG HYDROX CHEW) Take 1 tablet by mouth once a day CLONIDINE HCL 0.1 MG TABS (CLONIDINE HCL) Take 1 tablet by mouth two times a day CLONAZEPAM 0.5 MG  TABS (CLONAZEPAM) Take 1 to 1/2 tablet twice a day as needed for anxiety ASPIRIN 81 MG TABS (ASPIRIN) Take 1 tablet by mouth once a day OSCAL 500/200 D-3  TABS (CALCIUM-VITAMIN D  TABS) three times a day PRAVASTATIN SODIUM 40 MG TABS (PRAVASTATIN SODIUM) Take 1 tablet by mouth once a day   Current Allergies (reviewed today): ! NIACIN (NIACIN) ! RECLAST (ZOLEDRONIC ACID) ADHESIVE BANDAGES PLASTIC (ADHESIVE BANDAGES)    Risk Factors: Tobacco use:  quit    Year quit:  2007  Mammogram History:    Date of Last Mammogram:  08/27/2006   Review of Systems      See HPI  General      Denies chills, fatigue, and fever.  CV      Denies chest pain or discomfort and fainting.  Resp      Denies cough and shortness of breath.  GI      Denies abdominal pain, change in bowel habits, and vomiting.  GU      Denies dysuria and hematuria.  MS      Denies joint pain and joint swelling.  Derm      Denies lesion(s) and poor wound healing.  Neuro      Denies brief paralysis and falling down.  Psych      Denies anxiety and depression.  Heme      Denies abnormal bruising and bleeding.   Physical Exam  General:     alert, well-developed, well-nourished, and well-hydrated. Elderly woman in  NAD. Head:     atraumatic.   Eyes:     Wearing glasses. Anicteric sclerae. Ears:     no external deformities.   Nose:     no external deformity.   Neck:     supple, full ROM, and no masses.   Lungs:     normal respiratory effort, no intercostal retractions, and no accessory muscle use.   Extremities:     No e/c/c. Neurologic:     alert & oriented X3, cranial nerves II-XII intact, and gait normal.   Skin:     0.7 cm diameter healing abscess on L scapula. Minimal whitish discharge expressed at surface of lesion. Minimal surrounding erythema. No edema or tenderness. Superficial inflammation of skin surrounding the lesion (along medical tape lines). Psych:     Oriented X3, memory intact for recent and remote, normally interactive, good eye contact, not anxious appearing, and not depressed appearing.      Impression & Recommendations:  Problem # 1:  ABSCESS,  SKIN (ICD-682.9) Resolving. No need for further I+D or antibiotic tx.  Continue wound care until the lesion is granulated. Provided pt with gauze and antibiotic ointment. She has paper tape left at home that she will use given that she is allergic to plastic tape.  Problem # 2:  HYPERTENSION (ICD-401.9) Unchanged since prior visit.   Her updated medication list for this problem includes:    Lisinopril 40 Mg Tabs (Lisinopril) .Marland Kitchen... Take 1 tablet by mouth once a day    Hydrochlorothiazide 25 Mg Tabs (Hydrochlorothiazide) .Marland Kitchen... Take 1 tablet by mouth once a day    Clonidine Hcl 0.1 Mg Tabs (Clonidine hcl) .Marland Kitchen... Take 1 tablet by mouth two times a day  BP today: 142/77 Prior BP: 139/76 (03/23/2008)  Labs Reviewed: Creat: 1.30 (02/20/2008) Chol: 232 (02/23/2008)   HDL: 44 (02/23/2008)   LDL: 125 (02/23/2008)   TG: 313 (02/23/2008)   Complete Medication List: 1)  Lisinopril 40 Mg Tabs (Lisinopril) .... Take 1 tablet by mouth once a day 2)  Hydrochlorothiazide 25 Mg Tabs (Hydrochlorothiazide) .... Take 1 tablet by mouth once a day 3)  Pepcid Complete Chew (Famotidine-ca carb-mag hydrox chew) .... Take 1 tablet by mouth once a day 4)  Clonidine Hcl 0.1 Mg Tabs (Clonidine hcl) .... Take 1 tablet by mouth two times a day 5)  Clonazepam 0.5 Mg Tabs (Clonazepam) .... Take 1 to 1/2 tablet twice a day as needed for anxiety 6)  Aspirin 81 Mg Tabs (Aspirin) .... Take 1 tablet by mouth once a day 7)  Oscal 500/200 D-3 Tabs (Calcium-vitamin d tabs) .... Three times a day 8)  Pravastatin Sodium 40 Mg Tabs (Pravastatin sodium) .... Take 1 tablet by mouth once a day   Patient Instructions: 1)  Keep your appointment with Dr. Phillips Odor on 9/11. 2)  Continue washing the wound twice daily and changing the dressing daily. Continue applying the antibiotic ointment. Use paper tape.   ]

## 2010-09-05 NOTE — Letter (Signed)
Summary: Handout Printed  Printed Handout:  - *Patient Instructions 

## 2010-09-05 NOTE — Progress Notes (Signed)
Summary: phone/gg  Phone Note Call from Patient   Complaint: Headache Summary of Call: Pt would like you to call with test results.  Her # is [phone] Initial call taken by: Merrie Roof RN,  October 24, 2007 9:58 AM  Follow-up for Phone Call        Please let the patient know that her [place] was negative, looks completely normal.  We will need to set her up for CT scanning for complete W/U since this did not show anything. I will have Eunice Blase or Venita Sheffield call her about setting up this test. Follow-up by: Julaine Fusi  DO,  October 24, 2007 10:08 AM  Additional Follow-up for Phone Call Additional follow up Details #1::        Talked with pt per Dr Phillips Odor about [place] was negative. Aware she needs CT scan. Can do any time. Her phone number [phone]. Pt aware [facility] to call her back with time. Additional Follow-up by: Stanton Kidney Ditzler RN,  October 29, 2007 3:40 PM    Additional Follow-up for Phone Call Additional follow up Details #2::    Will contact patient this week- Have put in CT order- I will come down to sign the order 11/04/07. Follow-up by: Julaine Fusi  DO,  November 03, 2007 9:29 PM  Additional Follow-up for Phone Call Additional follow up Details #3:: Details for Additional Follow-up Action Taken: I cancelled CT order- spoke with patient today. She has already had A CT scan that was normal less than a year ago. Will see her back in clinic in 2-3 months, unless she has new problems. Additional Follow-up by: Julaine Fusi  DO,  November 06, 2007 3:31 PM

## 2010-09-05 NOTE — Assessment & Plan Note (Signed)
Summary: check up [mkj]   Vital Signs:  Patient Profile:   70 Years Old Female Height:     58.5 inches (148.59 cm) Weight:      124.0 pounds (56.36 kg) BMI:     25.57 Temp:     97.7 degrees F (36.50 degrees C) oral Pulse rate:   106 / minute BP sitting:   150 / 74  (right arm) Cuff size:   regular  Pt. in pain?   no  Vitals Entered By: Krystal Eaton Duncan Dull) (April 16, 2008 3:07 PM)              [name] Nutritional Status BMI of 25 - 29 = overweight  Have you ever been in a relationship where you felt threatened, hurt or afraid?No   Does patient need assistance? Functional Status Self care Ambulation Normal     PCP:  Julaine Fusi  DO  Chief Complaint:  f/u spot on back.  History of Present Illness: Pt is a 70 year old woman with PMH significant for skin abscess, TIA, anemia, melanoma, HTN, HLD, and anxiety who presents to clinic for follow up of skin abscess that was I and D and treated with doxycycline.  She denies any drainage and fevers.  Pt would also like to follow up on FLP results after she stopped taking her statin.  I went over the results of the FLP with her and told her she needed to discuss with her PCP about cholesterol control.  Pt has rescheduled with PCP to go over her chronic issues.      Prior Medications Reviewed Using: Medication Bottles  Updated Prior Medication List: LISINOPRIL 40 MG TABS (LISINOPRIL) Take 1 tablet by mouth once a day HYDROCHLOROTHIAZIDE 25 MG TABS (HYDROCHLOROTHIAZIDE) Take 1 tablet by mouth once a day PEPCID COMPLETE  CHEW (FAMOTIDINE-CA CARB-MAG HYDROX CHEW) Take 1 tablet by mouth once a day CLONIDINE HCL 0.1 MG TABS (CLONIDINE HCL) Take 1 tablet by mouth two times a day CLONAZEPAM 0.5 MG  TABS (CLONAZEPAM) Take 1 to 1/2 tablet twice a day as needed for anxiety ASPIRIN 81 MG TABS (ASPIRIN) Take 1 tablet by mouth once a day OSCAL 500/200 D-3  TABS (CALCIUM-VITAMIN D TABS) three times a day  Current  Allergies (reviewed today): ! NIACIN (NIACIN) ! RECLAST (ZOLEDRONIC ACID) ADHESIVE BANDAGES PLASTIC (ADHESIVE BANDAGES)    Risk Factors: Tobacco use:  quit    Year quit:  2007  Mammogram History:    Date of Last Mammogram:  08/27/2006   Review of Systems       SEE HPI   Physical Exam  General:     alert, well-developed, well-nourished, and well-hydrated.   Head:     normocephalic and atraumatic.   Mouth:     pharynx pink and moist.   Neck:     supple.   Lungs:     normal respiratory effort, no intercostal retractions, no accessory muscle use, normal breath sounds, no crackles, and no wheezes.   Heart:     normal rate and regular rhythm.   Abdomen:     soft, non-tender, and normal bowel sounds.   Neurologic:     alert & oriented X3.   Skin:     I and D site is clean and healing.  No sign of active infection.  Will need re-examination at follow up.   Psych:     Oriented X3, normally interactive, good eye contact, not anxious appearing, and not depressed appearing.  Impression & Recommendations:  Problem # 1:  ABSCESS, SKIN (ICD-682.9) Pt has finished her course of doxycyline.  No sign of current infection and wound looks like it is healing.  Will need to check at follow up .   Problem # 2:  HYPERLIPIDEMIA (ICD-272.4) Pt. stopped taking the pravastatin on her own.  This will need to be followed up by PCP.  I have told pt that her cholesterol needs better control.   The following medications were removed from the medication list:    Pravastatin Sodium 40 Mg Tabs (Pravastatin sodium) .Marland Kitchen... Take 1 tablet by mouth once a day   Complete Medication List: 1)  Lisinopril 40 Mg Tabs (Lisinopril) .... Take 1 tablet by mouth once a day 2)  Hydrochlorothiazide 25 Mg Tabs (Hydrochlorothiazide) .... Take 1 tablet by mouth once a day 3)  Pepcid Complete Chew (Famotidine-ca carb-mag hydrox chew) .... Take 1 tablet by mouth once a day 4)  Clonidine Hcl 0.1 Mg Tabs  (Clonidine hcl) .... Take 1 tablet by mouth two times a day 5)  Clonazepam 0.5 Mg Tabs (Clonazepam) .... Take 1 to 1/2 tablet twice a day as needed for anxiety 6)  Aspirin 81 Mg Tabs (Aspirin) .... Take 1 tablet by mouth once a day 7)  Oscal 500/200 D-3 Tabs (Calcium-vitamin d tabs) .... Three times a day   Patient Instructions: 1)  Please reschedule an appointment with Dr. Phillips Odor in 3-4 weeks.  The MRI/MRA of your head was normal.    Prescriptions: CLONAZEPAM 0.5 MG  TABS (CLONAZEPAM) Take 1 to 1/2 tablet twice a day as needed for anxiety  #30 x 4   Entered and Authorized by:   Rufina Falco MD   Signed by:   Rufina Falco MD on 04/16/2008   Method used:   Print then Give to Patient   RxID:   [id number]  ]

## 2010-09-05 NOTE — Progress Notes (Signed)
  Phone Note Other Incoming   Call placed by: Marin Roberts RN,  January 28, 2007 1:00 PM Summary of Call: pt daughter called stated since short stay visit yesterday for injection, pt has been throwing up, weak, bp extremely high, instructed daughter to take pt to er asap. she is agreeable Initial call taken by: Marin Roberts RN,  January 28, 2007 1:02 PM

## 2010-09-05 NOTE — Assessment & Plan Note (Signed)
Summary: COMPLETE PHYSICAL [MKJ]   Vital Signs:  Patient Profile:   70 Years Old Female Weight:      124.0 pounds (56.36 kg) Temp:     97.7 degrees F (36.50 degrees C) oral Pulse rate:   90 / minute BP sitting:   148 / 75  (right arm)  Pt. in pain?   yes    Location:   upper right chest    Intensity:   5-6  Vitals Entered By: Stanton Kidney Ditzler RN (September 10, 2006 11:17 AM)              [name] Nutritional Status Normal Nutritional Status Detail good  Have you ever been in a relationship where you felt threatened, hurt or afraid?denies   Does patient need assistance? Functional Status Self care Ambulation Normal   PCP:  Artist Beach  Chief Complaint:  Physical & labs.  History of Present Illness: Ms Sacra is a 70yr old Caucasian female with h/o HTN, Osteoporosis, GERD who presents today for a complete physical check up. She says she had noticed a "knot" come up on rt side of her chest over last few days, ass with pain. She does not remember being hurt, or lifting wts. She did get a Mammogam 2 wks ago which was read as normal. Also she c/o pain in her rt knee ass with swelling and morning stiffness. She has noticed this for last 4-5 mths. She occ hears popping noises while walking and that her knee gives away occ. Pt would like her thyroid to be checked, labs drawn for cholesterol and prescribe Vitamins.  She seemed very needy today.   Prior Medications: LISINOPRIL 40 MG TABS (LISINOPRIL) Take 1 tablet by mouth once a day HYDROCHLOROTHIAZIDE 25 MG TABS (HYDROCHLOROTHIAZIDE) Take 1 tablet by mouth once a day PEPCID COMPLETE  CHEW (FAMOTIDINE-CA CARB-MAG HYDROX CHEW) Take 1 tablet by mouth once a day CLONIDINE HCL 0.1 MG TABS (CLONIDINE HCL) Take 1 tablet by mouth two times a day CLONAZEPAM ODT 0.25 MG TBDP (CLONAZEPAM) two times a day ASPIRIN 81 MG TABS (ASPIRIN) Take 1 tablet by mouth once a day OSCAL 500/200 D-3  TABS (CALCIUM-VITAMIN D TABS) three times  a day Current Allergies: ! NIACIN (NIACIN)  Past Medical History:    Anxiety    GERD    Hyperlipidemia 224/245/42/133 4/07, repeat this visit    Hypertension    Osteoporosis - pt on Oscal + Vit D, repeat Bone scan next visit, if still osteoporotic start Fosamax if tolerable.    Angioedema - Sec to Niacin    Venous insufficiency    Hyponatremia - around 133    Tobacco abuse    Melanoma, mole in back    DEXA scan -osteoporosis on f/u DEXA in 1/ 2008    Risk Factors:  Tobacco use:  quit  Mammogram History:     Date of Last Mammogram:  08/27/2006    Results:  No specific mammographic evidence of malignancy     Mammogram History:     Date of Last Mammogram:  08/27/2006    Results:  No specific mammographic evidence of malignancy    Review of Systems      See HPI   Physical Exam  General:     alert, healthy-appearing, and cooperative to examination.   Head:     normocephalic.   Eyes:     vision grossly intact, pupils equal, pupils round, and pupils reactive to light.   Nose:  no external deformity.   Mouth:     pharynx pink and moist and edentulous.   Neck:     supple, no masses, no thyromegaly, and no JVD.   Chest Wall:     no deformities, no tenderness, and no mass.   Breasts:     skin/areolae normal, no masses, and no abnormal thickening.   Lungs:     normal respiratory effort, normal breath sounds, no crackles, and no wheezes.   Heart:     normal rate, regular rhythm, no murmur, and no gallop.   Abdomen:     soft, non-tender, and normal bowel sounds.   Msk:     decreased ROM at rt hip joint due to pain in anterior part of thigh, no localized jt tenderness.    Wrist/Hand Exam  General:    very anxious appearing   Knee Exam  Gait:    normal  Skin:    sl erythematous  Inspection:    swelling:   Palpation:    no tenderness, no fluctuation, patella not ballatable    Impression & Recommendations:  Problem # 1:  DYSURIA  (ICD-788.1) follow up to see for infection. If positive for nitrite/LE, will get a culture and tentatively start pt on antibiotic Orders: T-Urinalysis ([id number])   Problem # 2:  KNEE PAIN (ICD-719.46) With her h/o Osteoporosis, and also presentation, I am concerned for osteoarthritis or with h/o of Raynaud's phenomenon, GERD may be a CTD (CREST syndrome) For now will go ahead and get X-ray of hip and knee joints and also check a Sed rate. Her updated medication list for this problem includes:    Aspirin 81 Mg Tabs (Aspirin) .Marland Kitchen... Take 1 tablet by mouth once a day  Orders: Diagnostic X-Ray/Fluoroscopy (Diagnostic X-Ray/Flu)   Problem # 3:  MELANOMA (ICD-172.9) I had been wanting to refer the patient to Dermatologist for a complete evaluation and intervention if deemed required for the melanoma she was diagnosed with, s/p Excisional biopsy. But she couldn't do that as she was a 'Seld-pay' and was waiting for Medicare to kick in. Now that she has that, I have made the referral and she has an apt for 11/14/06. She has not noticed any new moles, acute change in colour or size or an existing mole. The incision site on her back looked fine.  Problem # 4:  OSTEOPOROSIS (ICD-733.00) She had a f/u DEXA scan on 08/27/06 which showed worsening of BMD and now has T-score of  -2.5 in her spine and hip joint. I explained to her what that meant and how it can be treated. Patient is concerned about using Bisphosphonates as her sister was put on it and 1 yr later found out that she had Esophageal cancer. I assured her that the medicine though known to cause worsening of reflux symptoms, will not cause cancer, but she is still not convinced. I then told her about the IV Zoledronic acid which requires once a year administration and rarely reported to cause Osteonecrosis of jaw. She is interested in it if Medicare will pay for it. To look into that I called [facility] stay at [facility] where it is administered, but they were  not sure of coverage. So I have left a msg for the Drug rep - Netta Cedars who is resp for the entire coverage at our hosp, to get some info. I shall call back the pt as soon as I get the required info. Her updated medication list for this problem includes:  Oscal 500/200 D-3 Tabs (Calcium-vitamin d tabs) .Marland Kitchen... Three times a day  Orders: T-TSH ([id number]) T-Sed Rate (Automated) ([id number])   Problem # 5:  HYPERTENSION (ICD-401.9) Repeat BP was 134/72, which is acceptable. Continue current regimen. Her updated medication list for this problem includes:    Lisinopril 40 Mg Tabs (Lisinopril) .Marland Kitchen... Take 1 tablet by mouth once a day    Hydrochlorothiazide 25 Mg Tabs (Hydrochlorothiazide) .Marland Kitchen... Take 1 tablet by mouth once a day    Clonidine Hcl 0.1 Mg Tabs (Clonidine hcl) .Marland Kitchen... Take 1 tablet by mouth two times a day  Orders: T-Comprehensive Metabolic Panel ([id number])   Problem # 6:  HYPERLIPIDEMIA (ICD-272.4) The patient was having a hard time afforting the below med. So I changed it to Pravastatin, which is available at $4 at [facility]. Three months into the new drug, pt is scheduled to come back for a rpt FLP and see the trend. The following medications were removed from the medication list:    Zocor 40 Mg Tabs (Simvastatin) .Marland Kitchen... Take 1 tablet by mouth once a day  Her updated medication list for this problem includes:    Pravastatin Sodium 40 Mg Tabs (Pravastatin sodium) .Marland Kitchen... Take 1 tablet by mouth once a day  Orders: T-Comprehensive Metabolic Panel [id number]) T-Lipid Profile [id number])  Future Orders: T-Lipid Profile ([id number]) ... 12/19/2006   Problem # 7:  ANXIETY (ICD-300.00) I guess too many things going on for her right now.  She seemed to settle down as the visit progressed and things were being taken care of. Her updated medication list for this problem includes:    Clonazepam Odt 0.25 Mg Tbdp (Clonazepam) .Marland Kitchen..Marland Kitchen Two times a day   Problem # 8:  Screening  Breast Cancer (ICD-V76.10) Mammogram 08/27/06 - Neg  Problem # 9:  Preventive Health Care (ICD-V70.0) Pt will need a screening colonoscopy once all this is settled.  Medications Added to Medication List This Visit: 1)  Pravastatin Sodium 40 Mg Tabs (Pravastatin sodium) .... Take 1 tablet by mouth once a day  Other Orders: Dermatology Referral (Derma)   Patient Instructions: 1)  Recommend increasing fluid intake for dysuria for the next few days. 2)  Limit intake of Sodium (Salt) to less than 2 grams a day to prevent fluid retention, swelling, or worsening of symptoms. 3)  Avoid foods high in acid content (tomatoes, citrus juices, spicy foods). Avoid eating within two hours of lying down or before exercising. Do not over eat; try smaller more frequent meals. Elevate head of bed twelve inches when sleeping. 4)  Please schedule a follow-up appointment in 3 months. 5)  Be sure to return for a FASTING Lipid Profile on 12/19/06.   Mammogram  Procedure date:  08/27/2006  Findings:      No specific mammographic evidence of malignancy  Mammogram  Procedure date:  08/27/2006  Findings:      No specific mammographic evidence of malignancy    Procedures Next Due Date:    Bone Density-Dexa: 09/2007    Mammogram: 09/2007   Appended Document: COMPLETE PHYSICAL [MKJ] This is an addendum after receiving the lab results on Ms Zenz:  UA - positive for LE with few bacteria, will go ahead and order urine culture and tentatively start pt on Cipro floxacin 500mg  two times a day for 3 days. FLP - 200/233/49/104 Pt on statin, just changed from Zocor to Pravastatin. Pt is allergic to Niacin, so the other med to use for elevated TG is Gemfibrozil. LDL with no h/o DM,  CAD/CVA is acceptable. Hence for now monitor on Pravastatin. Pt already scheduled for FLP in 3 mths to review the trend on new med.  This is a passing thought, but with pt's h/o Raynaud's phenomenon, GERD which has not be  investigated by EGD and could be esophageal dysmotility prob. Also her Ca is slightly elevated at 10.1, so this makes me think for CREST syndrome. The knot she had been c/o could be calcinosis cutis. I need to specifically look for telangiectasias. My plan is to check ANA, Anti-smith, Anti-Ro and Anti-La antibodies and also a PO4 level. Nothing urgent as she does not have Renal insuff or pulmonary manifestations.  Also about the IV zoledronic acid, I did get a call back from Netta Cedars and he said tht Medicare pays in total for it. I tried calling the pt this morn, but there was no response and I did not think it appropriate to  leave a msg. So will try calling again this pm. Prescriptions: CIPRO 500 MG TABS (CIPROFLOXACIN HCL) Take 1 tablet by mouth two times a day  #6 x 0   Entered and Authorized by:   Artist Beach MD   Signed by:   Artist Beach MD on 09/11/2006   Method used:   Telephoned to ...         RxID:   [id number]    Clinical Lists Changes  Medications: Added new medication of CIPRO 500 MG TABS (CIPROFLOXACIN HCL) Take 1 tablet by mouth two times a day - Signed Rx of CIPRO 500 MG TABS (CIPROFLOXACIN HCL) Take 1 tablet by mouth two times a day;  #6 x 0;  Signed;  Entered by: Artist Beach MD;  Authorized by: Artist Beach MD;  Method used: Telephoned to Orders: Added new Test order of T-Culture, Urine ([id number]) - Signed  Pt called and informed of lab results and x-ray findings. She would like to think about IV Zoledronic acid.    Appended Document: COMPLETE PHYSICAL [MKJ] 09/11/06 2:20PM Cipro 500mg  #6 [facility] per Dr Allena Katz ..................................................................Marland KitchenDebra Ditzler RN  Appended Document: COMPLETE PHYSICAL [MKJ] Pt grew E.coli out of her urine which was sensitive to Ciprofloxacin which was used as an empiric ab to treat.  Appended Document: COMPLETE PHYSICAL [MKJ] FLP is improved as below: Total chol 224 down to 191, TG  245 down to 172, HDL 42 upto 49 and LDL 133 down to 108.  Continue current regimen.

## 2010-09-05 NOTE — Progress Notes (Signed)
Summary: phone/gg  Phone Note Call from Patient   Caller: Patient Summary of Call: Pt called and wants the results of MRI and mammogram Pt # [phone] Initial call taken by: Merrie Roof RN,  March 10, 2008 2:40 PM  Follow-up for Phone Call        MRI -is normal. Her ECHO- normal. Her cholesterol is elevated so I need to see her soon to discuss increasing her medication as well as some other lab abnormalities-- can you add her on to my clinic schedule Wed 9/19. OK to put her in a HFU spot--thanks!!!!   Follow-up by: Julaine Fusi  DO,  March 15, 2008 8:39 AM

## 2010-09-05 NOTE — Progress Notes (Signed)
Summary: Refill/gh  Phone Note Refill Request Message from:  Fax from Pharmacy on October 13, 2008 11:46 AM  Refills Requested: Medication #1:  LOVASTATIN 10 MG TABS Take 1 tablet by mouth once a day   Last Refilled: 09/02/2008  Method Requested: Electronic Initial call taken by: Angelina Ok RN,  October 13, 2008 11:47 AM  Follow-up for Phone Call        Refill approved-nurse to complete Follow-up by: Julaine Fusi  DO,  October 13, 2008 8:06 PM      Prescriptions: LOVASTATIN 10 MG TABS (LOVASTATIN) Take 1 tablet by mouth once a day  #30 x 6   Entered and Authorized by:   Julaine Fusi  DO   Signed by:   Julaine Fusi  DO on 10/13/2008   Method used:   Electronically to        [facility] [phone]* (retail)       [address]       [place], [place]  [address]       Ph: [phone]       Fax: [phone]   RxID:   [id number]

## 2010-09-05 NOTE — Assessment & Plan Note (Signed)
Summary: f/u [mkj]   Vital Signs:  Patient profile:   70 year old female Height:      58.5 inches (148.59 cm) Weight:      115.03 pounds (52.29 kg) BMI:     23.72 Temp:     97 degrees F (36.11 degrees C) oral Pulse rate:   77 / minute BP sitting:   134 / 71  (right arm)  Vitals Entered By: Angelina Ok RN (August 25, 2009 9:14 AM) [name] Pain Assessment Patient in pain? yes     Location: abd  Intensity: 2 Type: cramping Onset of pain  Intermittent Nutritional Status BMI of 19 -24 = normal  Have you ever been in a relationship where you felt threatened, hurt or afraid?No   Does patient need assistance? Functional Status Self care Ambulation Normal Comments Has just gotten over the stomach flu.  Last Wednesday.  Feels better.  Little weak.  Missed meds x 2 days. Check up.  Blood work.  Taking [facility] Acid Controller Complete.  Unable to get Pepsid..   Primary Care Provider:  Julaine Fusi  DO   History of Present Illness: Just getting over GI virus.Had NVD for 2 days. Much better now. No issues with her medications. No other complaints,  Depression History:      The patient denies a depressed mood most of the day and a diminished interest in her usual daily activities.        The patient denies that she feels like life is not worth living, denies that she wishes that she were dead, and denies that she has thought about ending her life.         Allergies: 1)  ! Niacin (Niacin) 2)  ! Reclast (Zoledronic Acid) 3)  ! * Statin 4)  Adhesive Bandages Plastic (Adhesive Bandages)  Review of Systems  The patient denies anorexia, fever, weight loss, chest pain, syncope, dyspnea on exertion, peripheral edema, prolonged cough, headaches, hemoptysis, abdominal pain, hematochezia, and severe indigestion/heartburn.    Physical Exam  General:  alert, well-developed, well-nourished, well-hydrated, appropriate dress, normal appearance, healthy-appearing,  cooperative to examination, and good hygiene.   Lungs:  normal respiratory effort, no intercostal retractions, no accessory muscle use, normal breath sounds, no crackles, and no wheezes.   Heart:  normal rate, regular rhythm, no murmur, no gallop, and no rub.   Abdomen:  soft, non-tender, normal bowel sounds, no guarding, and no rigidity.   Psych:  Oriented X3, memory intact for recent and remote, normally interactive, good eye contact, not anxious appearing, and not depressed appearing.     Impression & Recommendations:  Problem # 1:  HYPERLIPIDEMIA (ICD-272.4) Need to repeat lipid panel. Patient is statin intolerant. I have strated her on Fish oil and counselled her on MNT. Orders: T-Lipid Profile ([id number])  Problem # 2:  HYPERTENSION (ICD-401.9) At goal today no changes. Following Na, which has been low in teh past- felt to be because of HCTZ-if normal will continue and monitor. Her updated medication list for this problem includes:    Lisinopril 40 Mg Tabs (Lisinopril) .Marland Kitchen... Take 1 tablet by mouth once a day    Hydrochlorothiazide 25 Mg Tabs (Hydrochlorothiazide) .Marland Kitchen... Take 1 tablet by mouth once a day    Carvedilol 12.5 Mg Tabs (Carvedilol) .Marland Kitchen... Take 1 tablet by mouth two times a day  Orders: T-Basic Metabolic Panel [id number]) T-CBC w/Diff [id number])  BP today: 134/71 Prior BP: 143/79 (05/16/2009)  Prior 10 Yr Risk Heart Disease: 17 % (  05/16/2009)  Labs Reviewed: K+: 4.6 (05/16/2009) Creat: : 1.25 (05/16/2009)   Chol: 261 (03/08/2009)   HDL: 51 (03/08/2009)   LDL: 181 (03/08/2009)   TG: 145 (03/08/2009)  Problem # 3:  ANXIETY (ICD-300.00) Patient concerned after reading drug pamphlet that she may get "addicted". She is only taking it once a day and says that it definetly helps her. I eduated her on med complaince with benzos. She understand plan. Change to once daily. Her updated medication list for this problem includes:    Clonazepam 0.5 Mg Tabs (Clonazepam)  .Marland Kitchen... Take 1 to 1/2 tablet twice a day as needed for anxiety  Complete Medication List: 1)  Lisinopril 40 Mg Tabs (Lisinopril) .... Take 1 tablet by mouth once a day 2)  Hydrochlorothiazide 25 Mg Tabs (Hydrochlorothiazide) .... Take 1 tablet by mouth once a day 3)  Pepcid Complete Chew (Famotidine-ca carb-mag hydrox chew) .... Take 1 tablet by mouth once a day 4)  Clonazepam 0.5 Mg Tabs (Clonazepam) .... Take 1 to 1/2 tablet twice a day as needed for anxiety 5)  Aspirin 81 Mg Tabs (Aspirin) .... Take 1 tablet by mouth once a day 6)  Oscal 500/200 D-3 Tabs (Calcium-vitamin d tabs) .... Take 1 tablet by mouth once a day 7)  Carvedilol 12.5 Mg Tabs (Carvedilol) .... Take 1 tablet by mouth two times a day 8)  Omega-3 & Omega-6 Fish Oil Caps (Omega 3-6-9 fatty acids) .... Take 1 tablet by mouth once a day 9)  Multivitamins Tabs (Multiple vitamin) .... Take 1 tablet by mouth once a day 10)  Benefiber Tabs (Wheat dextrin) .... Take 1 tablet by mouth two times a day or as directed 11)  Vitamin D3 400 Unit Tabs (Cholecalciferol) .... Take 1 tablet by mouth two times a day  Other Orders: T-Hepatic Function [id number])  Patient Instructions: 1)  Please schedule a follow-up appointment in 2-3 months. Prescriptions: CLONAZEPAM 0.5 MG  TABS (CLONAZEPAM) Take 1 to 1/2 tablet twice a day as needed for anxiety  #30 x 3   Entered and Authorized by:   Julaine Fusi  DO   Signed by:   Julaine Fusi  DO on 08/25/2009   Method used:   Print then Give to Patient   RxID:   [id number]   Prevention & Chronic Care Immunizations   Influenza vaccine: Fluvax 3+  (05/16/2009)    Tetanus booster: Not documented    Pneumococcal vaccine: Not documented    H. zoster vaccine: Not documented  Colorectal Screening   Hemoccult: Not documented   Hemoccult action/deferral: Deferred  (08/25/2009)    Colonoscopy: Not documented  Other Screening   Pap smear: Not documented   Pap smear action/deferral: Not  indicated S/P hysterectomy  (05/16/2009)    Mammogram: ASSESSMENT: Negative - BI-RADS 1^MM DIGITAL SCREENING  (11/09/2008)   Mammogram action/deferral: mammogram ordered  (11/01/2008)   Mammogram due: 11/09/2009    DXA bone density scan: Not documented   DXA scan due: 09/2008    Smoking status: quit  (05/16/2009)  Lipids   Total Cholesterol: 261  (03/08/2009)   Lipid panel action/deferral: Lipid Panel ordered   LDL: 181  (03/08/2009)   LDL Direct: Not documented   HDL: 51  (03/08/2009)   Triglycerides: 145  (03/08/2009)    SGOT (AST): 17  (03/08/2009)   BMP action: Ordered   SGPT (ALT): 11  (03/08/2009)   Alkaline phosphatase: 82  (03/08/2009)   Total bilirubin: 0.5  (03/08/2009)    Lipid flowsheet reviewed?: Yes  Progress toward LDL goal: Deteriorated  Hypertension   Last Blood Pressure: 134 / 71  (08/25/2009)   Serum creatinine: 1.25  (05/16/2009)   Serum potassium 4.6  (05/16/2009)    Hypertension flowsheet reviewed?: Yes   Progress toward BP goal: At goal  Self-Management Support :   Personal Goals (by the next clinic visit) :      Personal blood pressure goal: 130/80  (05/16/2009)     Personal LDL goal: 100  (05/16/2009)    Patient will work on the following items until the next clinic visit to reach self-care goals:     Medications and monitoring: take my medicines every day, bring all of my medications to every visit  (08/25/2009)     Eating: eat more vegetables, eat foods that are low in salt, eat baked foods instead of fried foods  (08/25/2009)     Activity: take a 30 minute walk every day  (08/25/2009)    Hypertension self-management support: Education handout, Written self-care plan  (08/25/2009)   Hypertension self-care plan printed.   Hypertension education handout printed    Lipid self-management support: Written self-care plan  (05/16/2009)    Nursing Instructions: Patient states she had a colonoscopy at [facility]. Not sure when. Need to check  for results.  Process Orders Check Orders Results:     [facility]: Check successful Tests Sent for requisitioning (August 30, 2009 12:02 PM):     08/25/2009: [facility] -- T-Lipid Profile [id number] (signed)     08/25/2009: [facility] -- T-Hepatic Function [id number] (signed)     08/25/2009: [facility] -- T-Basic Metabolic Panel [id number] (signed)     08/25/2009: [facility] -- [facility] w/Diff [[id number]] (signed)

## 2010-09-05 NOTE — Progress Notes (Signed)
----   Converted from flag ---- ---- 06/11/2007 2:05 PM, Julaine Fusi  DO wrote: Try this... [phone] son's phone number-patient told me her phone perodically get shut off and it is ok to leave information on this line.  ---- 06/10/2007 3:09 PM, Marin Roberts RN wrote: i have tried to call this pt several times and left a message ...................................................................{USER.[email or web address]}  {DATETIMESTAMP()}   ---- 06/02/2007 10:52 AM, Julaine Fusi  DO wrote: Can you let Ms. Siler know that I reviewed her labs and that she can discontinue her mag and that they are basically normal. Thanks!  ---- 05/29/2007 1:03 PM, Marin Roberts RN wrote: Fontaine No, ms Redinger called and said she hadn't heard anything about her knee or bladder...ph# [phone]  thanks, Kruze Atchley ------------------------------

## 2010-09-05 NOTE — Assessment & Plan Note (Signed)
Summary: HEMOCCULT RESULTS//KG   Laboratory Results  Date/Time Received: Lynn Ito  June 20, 2009 12:19 PM   Date/Time Reported: Lynn Ito  June 20, 2009 12:19 PM   Stool - Occult Blood Hemmoccult #1: negative Date: 06/10/2009 Hemoccult #2: negative Date: 06/12/2009 Hemoccult #3: negative Date: 06/14/2009  Kit Test Internal QC: Positive   (Normal Range: Negative)

## 2010-09-05 NOTE — Miscellaneous (Signed)
Summary: Orders Update  Clinical Lists Changes  Orders: Added new Test order of T-Basic Metabolic Panel [id number]) - Signed Added new Test order of T-Calcium  [id number]) - Signed Added new Test order of T-Magnesium ([id number]) - Signed Added new Test order of T-Phosphorus ([id number]) - Signed Added new Test order of T- * Misc. Laboratory test [id number]) - Signed

## 2010-09-05 NOTE — Progress Notes (Signed)
Summary: Call for DEXA order  Phone Note From Other Clinic   Caller: Provider Call For: PCP Summary of Call: Pt coming into office this am and [facility] radiology needs an order. Initial call taken by: Henderson Cloud,  August 27, 2006 8:53 AM  Follow-up for Phone Call Details for Follow-up Action Taken: Entered order in EMR. Put on hold to PCP Dr.Patel for electronic signature. Paper order signed by Dr. Luiz Iron. Faxed to [phone] Follow-up by: Henderson Cloud,  August 27, 2006 8:57 AM

## 2010-09-05 NOTE — Assessment & Plan Note (Signed)
Summary: B12 INJECTION/DS  Nurse Visit   Allergies: 1)  ! Niacin (Niacin) 2)  ! Reclast (Zoledronic Acid) 3)  ! * Statin 4)  Adhesive Bandages Plastic (Adhesive Bandages)  Medication Administration  Injection # 1:    Medication: Vit B12 1000 mcg    Diagnosis: ANEMIA, VITAMIN B12 DEFICIENCY (ICD-281.1)    Route: IM    Site: L deltoid    Exp Date: 02/202013    Lot #: [id number]    Mfr: [employer]    Patient tolerated injection without complications    Given by: Angelina Ok RN (March 27, 2010 9:47 AM)  Orders Added: 1)  Vit B12 1000 mcg [J3420]   Medication Administration  Injection # 1:    Medication: Vit B12 1000 mcg    Diagnosis: ANEMIA, VITAMIN B12 DEFICIENCY (ICD-281.1)    Route: IM    Site: L deltoid    Exp Date: 02/202013    Lot #: [id number]    Mfr: [employer]    Patient tolerated injection without complications    Given by: Angelina Ok RN (March 27, 2010 9:47 AM)  Orders Added: 1)  Vit B12 1000 mcg [J3420]

## 2010-09-05 NOTE — Letter (Signed)
Summary: Diagnosis/Procedures Clarification Form  Diagnosis/Procedures Clarification Form   Imported By: Florinda Marker 03/30/2008 16:50:36  _____________________________________________________________________  External Attachment:    Type:   Image     Comment:   External Document

## 2010-09-05 NOTE — Progress Notes (Signed)
Summary: phone/gg  Phone Note Call from Patient   Caller: Patient Summary of Call: Pt camein for B-12 injection today.  Injection given and bandaid applied to site. I was charting and noted an alert  -  Allergy to Adhesive Bandages Plastic I called pt within 20 minutes  of putting bandaid on and had her remove it.  She did and she states her arm looked fine. She will call for any problems. Initial call taken by: Merrie Roof RN,  January 25, 2010 10:33 AM  Follow-up for Phone Call        Thank you Follow-up by: Blanch Media MD,  January 25, 2010 12:03 PM

## 2010-09-05 NOTE — Assessment & Plan Note (Signed)
Summary: CHECKUP/SB.   Vital Signs:  Patient profile:   70 year old female Weight:      125.3 pounds (56.95 kg) BMI:     25.84 Temp:     97.6 degrees F oral Pulse rate:   90 / minute BP sitting:   186 / 87  (left arm) Cuff size:   regular  Vitals Entered By: Krystal Eaton Duncan Dull) (November 01, 2008 3:59 PM) [name] Pain Assessment Patient in pain? yes     Location: arms/legs Intensity: 2 Type: aching Onset of pain  Intermittent x2wks Nutritional Status BMI of 25 - 29 = overweight  Have you ever been in a relationship where you felt threatened, hurt or afraid?No   Does patient need assistance? Functional Status Self care Ambulation Normal Comments pt c/o muscle pains in arms/legs off and on x2wks, med refill, needs mammo   Primary Care Provider:  Julaine Fusi  DO   History of Present Illness: Elizabeth Woodward comes in today for rutine fiollow-up and medication refills. She is concerned today about worsening muscle pain that she discribes as diffuse- worse at night. Vague difficult to describe-different than her joint pain.   Preventive Screening-Counseling & Management     Smoking Status: quit     Year Quit: 2007  Problems Prior to Update: 1)  Muscle Pain  (ICD-729.1) 2)  Renal Insufficiency, Chronic  (ICD-585.9) 3)  Abscess, Skin  (ICD-682.9) 4)  Heart Murmur, Hx of  (ICD-V12.50) 5)  Transient Ischemic Attack  (ICD-435.9) 6)  Pelvic Pain, Right  (ICD-789.09) 7)  Abdominal Pain Other Specified Site  (ICD-789.09) 8)  Hypercalcemia  (ICD-275.42) 9)  Anemia, Mild  (ICD-285.9) 10)  Raynaud's Syndrome  (ICD-443.0) 11)  Allergic Rhinitis  (ICD-477.9) 12)  Hypomagnesemia  (ICD-275.2) 13)  Knee Pain  (ICD-719.46) 14)  Dysuria  (ICD-788.1) 15)  Melanoma  (ICD-172.9) 16)  Tobacco Abuse  (ICD-305.1) 17)  Hyponatremia  (ICD-276.1) 18)  Venous Insufficiency  (ICD-459.81) 19)  Angioedema  (ICD-995.1) 20)  Osteoporosis  (ICD-733.00) 21)  Hypertension   (ICD-401.9) 22)  Hyperlipidemia  (ICD-272.4) 23)  Gerd  (ICD-530.81) 24)  Anxiety  (ICD-300.00)  Medications Prior to Update: 1)  Lisinopril 40 Mg Tabs (Lisinopril) .... Take 1 Tablet By Mouth Once A Day 2)  Hydrochlorothiazide 25 Mg Tabs (Hydrochlorothiazide) .... Take 1 Tablet By Mouth Once A Day 3)  Pepcid Complete  Chew (Famotidine-Ca Carb-Mag Hydrox Chew) .... Take 1 Tablet By Mouth Once A Day 4)  Clonazepam 0.5 Mg  Tabs (Clonazepam) .... Take 1 To 1/2 Tablet Twice A Day As Needed For Anxiety 5)  Aspirin 81 Mg Tabs (Aspirin) .... Take 1 Tablet By Mouth Once A Day 6)  Oscal 500/200 D-3  Tabs (Calcium-Vitamin D Tabs) .... Take 1 Tablet By Mouth Once A Day 7)  Carvedilol 12.5 Mg Tabs (Carvedilol) .... Take 1 Tablet By Mouth Two Times A Day 8)  Fish Oil 1000 Mg Caps (Omega-3 Fatty Acids) .... Take 1 Tablet By Mouth Once A Day 9)  Multivitamins  Tabs (Multiple Vitamin) .... Take 1 Tablet By Mouth Once A Day 10)  Benefiber  Tabs (Wheat Dextrin) .... Take 1 Tablet By Mouth Two Times A Day or As Directed 11)  Lovastatin 10 Mg Tabs (Lovastatin) .... Take 1 Tablet By Mouth Once A Day 12)  Vitamin D3 400 Unit Tabs (Cholecalciferol) .... Take 1 Tablet By Mouth Two Times A Day  Allergies: 1)  ! Niacin (Niacin) 2)  ! Reclast (Zoledronic Acid)  3)  Adhesive Bandages Plastic (Adhesive Bandages)  Past History:  Family History:    Mother: 'Heart problem'    Father: Atherosclerosis    Brother: Colon Ca and Lung cancer at 51yrs    Sister: Esophagus cancer.      (05/31/2008)  Social History:    Marital Status: Married    Children: 1 son    Occupation: Retired    Former smoker     (05/31/2008)  Past medical, surgical, family and social histories (including risk factors) reviewed, and no changes noted (except as noted below).  Past Medical History:    Reviewed history from 05/14/2008 and no changes required:    Anxiety    GERD    Hyperlipidemia 224/245/42/133 4/07, repeat this visit     Hypertension    Osteoporosis - pt on Oscal + Vit D, rc'd Reclast infusion in 2008.    Angioedema - Sec to Niacin    Venous insufficiency    Hyponatremia - around 133    Tobacco abuse    Melanoma, mole in back    DEXA scan -osteoporosis on f/u DEXA in 1/ 2008  Past Surgical History:    Reviewed history from 06/13/2006 and no changes required:    Hysterectomy, partial  Family History:    Reviewed history from 05/31/2008 and no changes required:       Mother: 'Heart problem'       Father: Atherosclerosis       Brother: Colon Ca and Lung cancer at 42yrs       Sister: Esophagus cancer.          Social History:    Reviewed history from 05/31/2008 and no changes required:       Marital Status: Married       Children: 1 son       Occupation: Retired       Former smoker  Physical Exam  General:  alert, well-developed, well-nourished, and well-hydrated.   Lungs:  normal respiratory effort, no intercostal retractions, no accessory muscle use, normal breath sounds, no crackles, and no wheezes.   Heart:  normal rate and regular rhythm.   Abdomen:  soft, non-tender, and normal bowel sounds.   Msk:  intact-moves all 4 extremities. OA changes bilateral hands, slight knee effusion on the left Neurologic:  alert & oriented X3, cranial nerves II-XII intact, strength normal in all extremities, and sensation intact to light touch.     Impression & Recommendations:  Problem # 1:  MUSCLE PAIN (ICD-729.1) I am concerned that this may be a result of her statin therapy as she has had this in the past. I will check a total CK and have her hold her statin to see if she improves.  Her updated medication list for this problem includes:    Aspirin 81 Mg Tabs (Aspirin) .Marland Kitchen... Take 1 tablet by mouth once a day  Orders: T-CK Total [id number]) T-Comprehensive Metabolic Panel [id number]) T-Magnesium ([id number]) T-CBC w/Diff (32440-10272)  Problem # 2:  HYPERCALCEMIA (ICD-275.42) Will repeat  labs.  Problem # 3:  KNEE PAIN (ICD-719.46) Recommended Tylenol for her knee pain. She should probably avoid NSAIDS with her GI history.  Her updated medication list for this problem includes:    Aspirin 81 Mg Tabs (Aspirin) .Marland Kitchen... Take 1 tablet by mouth once a day  Problem # 4:  OSTEOPOROSIS (ICD-733.00) Will check a DEXA scan today to determine the progression of her Osteoporosis. Orders: Dexa scan (Dexa scan)  Problem # 5:  HYPERLIPIDEMIA (ICD-272.4)  Will hold her Lovastatin until next visit and see if her muscle pain improves. I recommneded Fish oil supplmentation daily and increasing the fivber in her diet.  The following medications were removed from the medication list:    Lovastatin 10 Mg Tabs (Lovastatin) .Marland Kitchen... Take 1 tablet by mouth once a day  Problem # 6:  HYPERTENSION (ICD-401.9) Still much more elevated than usual. Will consider adding an additional agent or home monitoring. Will check TSH today.  Her updated medication list for this problem includes:    Lisinopril 40 Mg Tabs (Lisinopril) .Marland Kitchen... Take 1 tablet by mouth once a day    Hydrochlorothiazide 25 Mg Tabs (Hydrochlorothiazide) .Marland Kitchen... Take 1 tablet by mouth once a day    Carvedilol 12.5 Mg Tabs (Carvedilol) .Marland Kitchen... Take 1 tablet by mouth two times a day  Orders: T-TSH ([id number])  BP today: 186/87 Prior BP: 140/80 (07/08/2008)  Labs Reviewed: K+: 4.7 (07/08/2008) Creat: : 1.26 (07/08/2008)   Chol: 232 (02/23/2008)   HDL: 44 (02/23/2008)   LDL: 125 (02/23/2008)   TG: 313 (02/23/2008)  Complete Medication List: 1)  Lisinopril 40 Mg Tabs (Lisinopril) .... Take 1 tablet by mouth once a day 2)  Hydrochlorothiazide 25 Mg Tabs (Hydrochlorothiazide) .... Take 1 tablet by mouth once a day 3)  Pepcid Complete Chew (Famotidine-ca carb-mag hydrox chew) .... Take 1 tablet by mouth once a day 4)  Clonazepam 0.5 Mg Tabs (Clonazepam) .... Take 1 to 1/2 tablet twice a day as needed for anxiety 5)  Aspirin 81 Mg Tabs  (Aspirin) .... Take 1 tablet by mouth once a day 6)  Oscal 500/200 D-3 Tabs (Calcium-vitamin d tabs) .... Take 1 tablet by mouth once a day 7)  Carvedilol 12.5 Mg Tabs (Carvedilol) .... Take 1 tablet by mouth two times a day 8)  Fish Oil 1000 Mg Caps (Omega-3 fatty acids) .... Take 1 tablet by mouth once a day 9)  Multivitamins Tabs (Multiple vitamin) .... Take 1 tablet by mouth once a day 10)  Benefiber Tabs (Wheat dextrin) .... Take 1 tablet by mouth two times a day or as directed 11)  Vitamin D3 400 Unit Tabs (Cholecalciferol) .... Take 1 tablet by mouth two times a day  Other Orders: Mammogram (Screening) (Mammo)  Mammogram Screening:    Last Mammogram:  08/27/2006    Reviewed Mammogram recommendations:  mammogram ordered  Osteoporosis Risk Assessment:  Risk Factors for Fracture or Low Bone Density:   Race (White or Asian):     yes   Smoking status:       quit  Immunization & Chemoprophylaxis:    Influenza vaccine: Fluvax MCR  (05/14/2008)    Patient Instructions: 1)  Please schedule a follow-up appointment in 2 months  (with Phillips Odor) 2)   Stop Lovastatin Prescriptions: CLONAZEPAM 0.5 MG  TABS (CLONAZEPAM) Take 1 to 1/2 tablet twice a day as needed for anxiety  #30 x 4   Entered and Authorized by:   Julaine Fusi  DO   Signed by:   Julaine Fusi  DO on 11/01/2008   Method used:   Print then Give to Patient   RxID:   [id number] HYDROCHLOROTHIAZIDE 25 MG TABS (HYDROCHLOROTHIAZIDE) Take 1 tablet by mouth once a day  #30 x 11   Entered and Authorized by:   Julaine Fusi  DO   Signed by:   Julaine Fusi  DO on 11/01/2008   Method used:   Electronically to        [facility]  [id number] (retail)       [address]       [place], [place]  [address]       Ph: [phone] or [phone]       Fax: [phone]   RxID:   [id number] LISINOPRIL 40 MG TABS (LISINOPRIL) Take 1 tablet by mouth once a day  #30 x 11   Entered and Authorized by:   Julaine Fusi  DO   Signed by:   Julaine Fusi  DO on 11/01/2008   Method used:   [facility]        [facility]  [address] [phone]* (retail)       [address]       [place], [place]  [address]       Ph: [phone] or [phone]       Fax: [phone]   RxID:   [id number]

## 2010-09-05 NOTE — Miscellaneous (Signed)
Summary: Orders Update  Clinical Lists Changes  Orders: Added new Test order of Dexa scan (Dexa scan) - Signed 

## 2010-09-05 NOTE — Progress Notes (Signed)
Summary: refill/gg  Phone Note Refill Request  on January 12, 2009 11:03 AM  Refills Requested: Medication #1:  CARVEDILOL 12.5 MG TABS Take 1 tablet by mouth two times a day  Method Requested: Electronic Initial call taken by: Merrie Roof RN,  January 12, 2009 11:03 AM  Follow-up for Phone Call        Refill approved-nurse to complete Follow-up by: Julaine Fusi  DO,  January 12, 2009 1:32 PM      Prescriptions: CARVEDILOL 12.5 MG TABS (CARVEDILOL) Take 1 tablet by mouth two times a day  #60 x 11   Entered and Authorized by:   Julaine Fusi  DO   Signed by:   Julaine Fusi  DO on 01/12/2009   Method used:   Electronically to        [facility] [phone]* (retail)       [address]       [place], [place]  [address]       Ph: [phone]       Fax: [phone]   RxID:   [id number]

## 2010-09-05 NOTE — Assessment & Plan Note (Signed)
Summary: B-12/SB.  Nurse Visit   Allergies: 1)  ! Niacin (Niacin) 2)  ! Reclast (Zoledronic Acid) 3)  ! * Statin 4)  Adhesive Bandages Plastic (Adhesive Bandages)  Medication Administration  Injection # 1:    Medication: Vit B12 1000 mcg    Diagnosis: ANEMIA, VITAMIN B12 DEFICIENCY (ICD-281.1)    Route: IM    Site: R deltoid    Exp Date: 12/2011    Lot #: [id number]    Mfr: [employer]    Patient tolerated injection without complications    Given by: Angelina Ok RN (June 01, 2010 3:56 PM)  Orders Added: 1)  Vit B12 1000 mcg [J3420] 2)  Admin of Therapeutic Inj  intramuscular or subcutaneous [[id number]]   Medication Administration  Injection # 1:    Medication: Vit B12 1000 mcg    Diagnosis: ANEMIA, VITAMIN B12 DEFICIENCY (ICD-281.1)    Route: IM    Site: R deltoid    Exp Date: 12/2011    Lot #: [id number]    Mfr: [employer]    Patient tolerated injection without complications    Given by: Angelina Ok RN (June 01, 2010 3:56 PM)  Orders Added: 1)  Vit B12 1000 mcg [J3420] 2)  Admin of Therapeutic Inj  intramuscular or subcutaneous [[id number]]

## 2010-09-05 NOTE — Assessment & Plan Note (Signed)
Summary: MONTHLY B12 INJ/CH  Nurse Visit   Allergies: 1)  ! Niacin (Niacin) 2)  ! Reclast (Zoledronic Acid) 3)  ! * Statin 4)  Adhesive Bandages Plastic (Adhesive Bandages)  Medication Administration  Injection # 1:    Medication: Vit B12 1000 mcg    Diagnosis: ANEMIA, VITAMIN B12 DEFICIENCY (ICD-281.1)    Route: IM    Site: L deltoid    Exp Date: 11/2011    Lot #: [id number]    Mfr: [employer]    Patient tolerated injection without complications    Given by: Angelina Ok RN (February 24, 2010 11:01 AM)  Orders Added: 1)  Admin of Therapeutic Inj  intramuscular or subcutaneous [96372] 2)  Vit B12 1000 mcg [J3420]   Medication Administration  Injection # 1:    Medication: Vit B12 1000 mcg    Diagnosis: ANEMIA, VITAMIN B12 DEFICIENCY (ICD-281.1)    Route: IM    Site: L deltoid    Exp Date: 11/2011    Lot #: [id number]    Mfr: [employer]    Patient tolerated injection without complications    Given by: Angelina Ok RN (February 24, 2010 11:01 AM)  Orders Added: 1)  Admin of Therapeutic Inj  intramuscular or subcutaneous [96372] 2)  Vit B12 1000 mcg [J3420]

## 2010-09-05 NOTE — Progress Notes (Signed)
  Phone Note Call from Patient   Reason for Call: Lab or Test Results Complaint: Breathing Problems Action Taken: Appt Scheduled Details for Reason: Wanting to sch/3 mth ck with labs Details of Complaint: Unable to Sch/with PCP Details of Action Taken: Only Lab given Summary of Call: Your patient called in requesting to see you for her 3 month check up.  She was very adamant that you wanted her to come back in to recheck her labs.   I was unable to sch her with you but, I did go ahead and sch her for her 3 month check up appointment for her labs to re-check her lipids, (per your last office note).  Would you please put in a new order for her labs, as I did not see a new order listed.  Thanks for helping me with this patient. Initial call taken by: Shon Hough,  February 23, 2009 9:32 AM  Follow-up for Phone Call        no problem - Wilfrid Lund put in for the labs- she can see me after the labs are drawn I will be in touch with her if they are abnormal- just schedule her with me when I have an opening Follow-up by: Julaine Fusi  DO,  February 24, 2009 11:36 AM  Additional Follow-up for Phone Call Additional follow up Details #1::        Contacted the pt and she will be coming in on 03/08/2009 for labs. She does understand that she will be called if she has  abnormal labs per your note.  The patient said she will also follow up with when you have an opening available.  Additional Follow-up by: Shon Hough,  March 01, 2009 12:23 PM

## 2010-09-05 NOTE — Miscellaneous (Signed)
Summary: vaccine record  vaccine record   Imported By: Harrison Mons Steps 06/15/2006 12:11:58  _____________________________________________________________________  External Attachment:    Type:   Image     Comment:   External Document

## 2010-09-05 NOTE — Assessment & Plan Note (Signed)
Summary: ACUTE-ER/FU CP/VOMITING/(ALVAREZ)CFB   Vital Signs:  Patient Profile:   70 Years Old Female Height:     58.5 inches Weight:      119.02 pounds BMI:     24.54 Temp:     98.4 degrees F oral Pulse rate:   113 / minute BP sitting:   177 / 85  (right arm)  Pt. in pain?   no  Vitals Entered By: Angelina Ok RN (February 12, 2007 1:49 PM)              [name]   PCP:  [facility]  Chief Complaint:  follow up from ED on Magnesium for 8 days had stomach  flu coughing white mucous.  History of Present Illness: Elizabeth Woodward is a 70yr old Caucasian female with h/o HTN, Osteoporosis, GERD and anxiety who presents today for a follow up visit.  She recently recieved IV Reclast, 8 days ago, and had side effects that inluded flu like symptoms and significant nausea and vomitiing. She was evaluated in the ED and found to have a low magnesium level. She says he symptoms have resloved although she feel weak and still has some nausea. She is here to have repeat blood work.   Current Allergies: ! NIACIN (NIACIN) ! RECLAST (ZOLEDRONIC ACID)    Risk Factors: Tobacco use:  quit    Year quit:  2007  Mammogram History:    Date of Last Mammogram:  08/27/2006   Review of Systems       The patient complains of hoarseness, prolonged cough, abdominal pain, and muscle weakness.  The patient denies anorexia, fever, syncope, dyspnea on exhertion, peripheral edema, hemoptysis, and severe indigestion/heartburn.     Physical Exam  General:     alert, well-nourished, and well-hydrated.  Anxious appearing Lungs:     normal respiratory effort, normal breath sounds, no dullness, no crackles, and no wheezes.   Heart:     normal rate, regular rhythm, no murmur, no gallop, and no rub.   Abdomen:     soft, non-tender, and normal bowel sounds.   Neurologic:     non focal Psych:     Anxious    Impression & Recommendations:  Problem # 1:  HYPOMAGNESEMIA (ICD-275.2) Will recheck  labs today. Possibly due to vomiting or could actually be a result of the medication administration. Clinical monitoring of calcium and mineral levels (phosphorus and magnesium) is highly recommended for these patients. She had no arythmias while being followed in teh ED and she is currently on Oral Mag replacement. She was complaining of diarrhea so I reduced her by mouth dose to 1 tab daily.   Await labs.  F/U in 4 weeks unless there are lab abnormalities.   Orders: T-Renal Function Panel [id number]) T-Magnesium [id number])   Problem # 2:  HYPERTENSION (ICD-401.9) Stable no changes today.  Her updated medication list for this problem includes:    Lisinopril 40 Mg Tabs (Lisinopril) .Marland Kitchen... Take 1 tablet by mouth once a day    Hydrochlorothiazide 25 Mg Tabs (Hydrochlorothiazide) .Marland Kitchen... Take 1 tablet by mouth once a day    Clonidine Hcl 0.1 Mg Tabs (Clonidine hcl) .Marland Kitchen... Take 1 tablet by mouth two times a day   Problem # 3:  ALLERGIC RHINITIS (ICD-477.9) Will try veramyst for 1 month. If effective will continue. Also recommended OTC Zyrtec.   Patient Instructions: 1)  Please schedule a follow-up appointment in 4-6 weeks. 2)  Take 1 Magnesium pill daily instead  of 2 unless we call you with different instructions. 3)  The Veramyst Spray is for your sinus problems -- Two squirts in each nostril DAILY

## 2010-09-05 NOTE — Assessment & Plan Note (Signed)
Summary: F/U/EST/VS   Vital Signs:  Patient profile:   70 year old female Height:      58.5 inches (148.59 cm) Weight:      125.5 pounds (57.05 kg) BMI:     25.88 Temp:     97.8 degrees F (36.56 degrees C) oral Pulse rate:   77 / minute BP sitting:   160 / 70  (left arm)  Vitals Entered By: Stanton Kidney Ditzler RN (Dec 06, 2008 10:01 AM) [name] Pain Assessment Patient in pain? no      Nutritional Status BMI of 25 - 29 = overweight Nutritional Status Detail appetite good  Have you ever been in a relationship where you felt threatened, hurt or afraid?denies   Does patient need assistance? Functional Status Self care Ambulation Normal Comments 2 month FU and discuss last labs and bone density results.   Primary Care Provider:  Julaine Fusi  DO   History of Present Illness: Elizabeth Woodward comes in for routine folow-up. At her last visit I stopped her statin due to muscle pain which was progressive an severe. She has been off teh statin for over 1 month and feels much better. This is the second attempt at a statin for cholesterol control.   Preventive Screening-Counseling & Management     Smoking Status: quit     Year Quit: 2007  Allergies: 1)  ! Niacin (Niacin) 2)  ! Reclast (Zoledronic Acid) 3)  ! * Statin 4)  Adhesive Bandages Plastic (Adhesive Bandages)  Review of Systems      See HPI  Physical Exam  General:  alert, well-developed, well-nourished, and well-hydrated.   Lungs:  normal respiratory effort, no intercostal retractions, no accessory muscle use, normal breath sounds, no crackles, and no wheezes.   Heart:  normal rate and regular rhythm.   Abdomen:  soft, non-tender, and normal bowel sounds.   Msk:  intact-moves all 4 extremities. OA changes bilateral hands.  Extremities:  no swelling Psych:  appropriate   Impression & Recommendations:  Problem # 1:  HYPERTENSION (ICD-401.9) 148/77 on re-check. F/U in 3 months. She takes her  medications on the following schedule 8AM HCTZ 10AM/8PM Coreg 3PM Lisinopril  No changes today. Will continue to follow- she took her medicine 20 minutes before seeing me- I advised her to take her BP at home or while out at the drug store and keep a record.  Her updated medication list for this problem includes:    Lisinopril 40 Mg Tabs (Lisinopril) .Marland Kitchen... Take 1 tablet by mouth once a day    Hydrochlorothiazide 25 Mg Tabs (Hydrochlorothiazide) .Marland Kitchen... Take 1 tablet by mouth once a day    Carvedilol 12.5 Mg Tabs (Carvedilol) .Marland Kitchen... Take 1 tablet by mouth two times a day  Problem # 2:  OSTEOPOROSIS (ICD-733.00) Discussed her T-Score- overall imprved bone density compared to last year! No changes- she had reclast inj with allergic reaction.  Problem # 3:  HYPERLIPIDEMIA (ICD-272.4) Intolerant to statins. Will recheck her lipids in 3 months.  Complete Medication List: 1)  Lisinopril 40 Mg Tabs (Lisinopril) .... Take 1 tablet by mouth once a day 2)  Hydrochlorothiazide 25 Mg Tabs (Hydrochlorothiazide) .... Take 1 tablet by mouth once a day 3)  Pepcid Complete Chew (Famotidine-ca carb-mag hydrox chew) .... Take 1 tablet by mouth once a day 4)  Clonazepam 0.5 Mg Tabs (Clonazepam) .... Take 1 to 1/2 tablet twice a day as needed for anxiety 5)  Aspirin 81 Mg Tabs (  Aspirin) .... Take 1 tablet by mouth once a day 6)  Oscal 500/200 D-3 Tabs (Calcium-vitamin d tabs) .... Take 1 tablet by mouth once a day 7)  Carvedilol 12.5 Mg Tabs (Carvedilol) .... Take 1 tablet by mouth two times a day 8)  Fish Oil 1000 Mg Caps (Omega-3 fatty acids) .... Take 1 tablet by mouth once a day 9)  Multivitamins Tabs (Multiple vitamin) .... Take 1 tablet by mouth once a day 10)  Benefiber Tabs (Wheat dextrin) .... Take 1 tablet by mouth two times a day or as directed 11)  Vitamin D3 400 Unit Tabs (Cholecalciferol) .... Take 1 tablet by mouth two times a day  Patient Instructions: 1)  Please schedule a follow-up  appointment in 3 months Lipid check/Labs.

## 2010-09-05 NOTE — Assessment & Plan Note (Signed)
Summary: 1WK FU/GOLDING/VS   Vital Signs:  Patient Profile:   70 Years Old Female Height:     58.5 inches (148.59 cm) Weight:      122.1 pounds (55.50 kg) BMI:     25.18 Temp:     97.5 degrees F (36.39 degrees C) oral Pulse rate:   95 / minute BP sitting:   139 / 76  (right arm)  Pt. in pain?   yes    Location:   upper back    Intensity:   4  Vitals Entered By: Stanton Kidney Ditzler RN (March 23, 2008 2:40 PM)              [name] Nutritional Status BMI of 25 - 29 = overweight Nutritional Status Detail appetite fair  Have you ever been in a relationship where you felt threatened, hurt or afraid?denies   Does patient need assistance? Functional Status Self care Ambulation Normal     PCP:  [facility]  Chief Complaint:  FU on boil. Sister in ICU - had surgery esog ca..  History of Present Illness: 70 year old with Past Medical History: Anxiety GERD Hyperlipidemia 224/245/42/133 4/07, repeat this visit Hypertension Osteoporosis - pt on Oscal + Vit D, repeat Bone scan next visit, if still osteoporotic start Fosamax if tolerable. Angioedema - Sec to Niacin Venous insufficiency Hyponatremia - around 133 Tobacco abuse Melanoma, mole in back DEXA scan -osteoporosis on f/u DEXA in 1/ 2008 Who comes for abscess follow up. She had a boil  that was drained  last week. The culture grew staph epi. She is day 9/10 of antibiotics. She relates fever that resolved 3 days ago. She is feeling better, the lesion is still draining small amount of puss. She denies worsening pain.      Prior Medications Reviewed Using: Medication Bottles  Updated Prior Medication List: LISINOPRIL 40 MG TABS (LISINOPRIL) Take 1 tablet by mouth once a day HYDROCHLOROTHIAZIDE 25 MG TABS (HYDROCHLOROTHIAZIDE) Take 1 tablet by mouth once a day PEPCID COMPLETE  CHEW (FAMOTIDINE-CA CARB-MAG HYDROX CHEW) Take 1 tablet by mouth once a day CLONIDINE HCL 0.1 MG TABS (CLONIDINE HCL) Take 1 tablet by  mouth two times a day CLONAZEPAM 0.5 MG  TABS (CLONAZEPAM) Take 1 to 1/2 tablet twice a day as needed for anxiety ASPIRIN 81 MG TABS (ASPIRIN) Take 1 tablet by mouth once a day OSCAL 500/200 D-3  TABS (CALCIUM-VITAMIN D TABS) three times a day PRAVASTATIN SODIUM 40 MG TABS (PRAVASTATIN SODIUM) Take 1 tablet by mouth once a day DOXYCYCLINE HYCLATE 100 MG  TABS (DOXYCYCLINE HYCLATE) Take 1 tablet by mouth two times a day  Current Allergies (reviewed today): ! NIACIN (NIACIN) ! RECLAST (ZOLEDRONIC ACID)    Risk Factors: Tobacco use:  quit    Year quit:  2007  Mammogram History:    Date of Last Mammogram:  08/27/2006   Review of Systems  The patient denies fever, weight loss, weight gain, decreased hearing, hoarseness, chest pain, syncope, dyspnea on exertion, peripheral edema, hemoptysis, abdominal pain, and melena.     Physical Exam  General:     alert, well-developed, and well-nourished.   Head:     normocephalic, atraumatic, and no abnormalities observed.   Lungs:     normal respiratory effort, no intercostal retractions, no accessory muscle use, and normal breath sounds.   Heart:     normal rate and regular rhythm.   Skin:     3-to 4 cm lesion irreguar border,with  small amount of pus, erythema, no fluctulence.     Impression & Recommendations:  Problem # 1:  ABSCESS, SKIN (ICD-682.9) Patient S/P I&D of  abscess 1 week ago. She relates that is draining less secretion, is less painful. no fever. On physical exam it has small amount of purulent discharge. I will give her 5 more days of antibiotics. I advised her that if she develops fever or worsening of the lesion to call us.  She will continue with dressing changes and bacitracim oitment. There is no need at this moment for imagin. I will see her in 1 week.  Her updated medication list for this problem includes:    Doxycycline Hyclate 100 Mg Tabs (Doxycycline hyclate) .Marland Kitchen... Take 1 tablet by mouth two times a  day   Complete Medication List: 1)  Lisinopril 40 Mg Tabs (Lisinopril) .... Take 1 tablet by mouth once a day 2)  Hydrochlorothiazide 25 Mg Tabs (Hydrochlorothiazide) .... Take 1 tablet by mouth once a day 3)  Pepcid Complete Chew (Famotidine-ca carb-mag hydrox chew) .... Take 1 tablet by mouth once a day 4)  Clonidine Hcl 0.1 Mg Tabs (Clonidine hcl) .... Take 1 tablet by mouth two times a day 5)  Clonazepam 0.5 Mg Tabs (Clonazepam) .... Take 1 to 1/2 tablet twice a day as needed for anxiety 6)  Aspirin 81 Mg Tabs (Aspirin) .... Take 1 tablet by mouth once a day 7)  Oscal 500/200 D-3 Tabs (Calcium-vitamin d tabs) .... Three times a day 8)  Pravastatin Sodium 40 Mg Tabs (Pravastatin sodium) .... Take 1 tablet by mouth once a day 9)  Doxycycline Hyclate 100 Mg Tabs (Doxycycline hyclate) .... Take 1 tablet by mouth two times a day   Patient Instructions: 1)  Please schedule a follow-up appointment in 1 weeks. 2)  continue with dressing changes and apply antibiotic oitment twice a day. 3)  you have 5 more days of antibiotics call pharmacy.   Prescriptions: DOXYCYCLINE HYCLATE 100 MG  TABS (DOXYCYCLINE HYCLATE) Take 1 tablet by mouth two times a day  #10 x 0   Entered and Authorized by:   Hartley Barefoot MD   Signed by:   Hartley Barefoot MD on 03/23/2008   Method used:   Electronically sent to ...       [address]       [address]       [place], [place]  [address]       Ph: [phone]       Fax: [phone]   RxID:   [id number] DOXYCYCLINE HYCLATE 100 MG  TABS (DOXYCYCLINE HYCLATE) Take 1 tablet by mouth two times a day  #10 x 0   Entered and Authorized by:   Hartley Barefoot MD   Signed by:   Hartley Barefoot MD on 03/23/2008   Method used:   Electronically sent to ...       [address]       [address]       [place], [place]  [address]       Ph: [phone]       Fax: [phone]   RxID:   [id number]  ]

## 2010-09-05 NOTE — Assessment & Plan Note (Signed)
Summary: 2WK FU/EST/VS   Vital Signs:  Patient Profile:   70 Years Old Female Height:     58.5 inches (148.59 cm) Weight:      124.4 pounds (56.55 kg) BMI:     25.65 Temp:     97.8 degrees F (36.56 degrees C) oral Pulse rate:   72 / minute BP sitting:   147 / 73  (right arm)  Pt. in pain?   no  Vitals Entered By: Stanton Kidney Ditzler RN (May 31, 2008 9:32 AM)              [name] Nutritional Status BMI of 25 - 29 = overweight Nutritional Status Detail appetite good  Have you ever been in a relationship where you felt threatened, hurt or afraid?denies   Does patient need assistance? Functional Status Self care Ambulation Normal     PCP:  Julaine Fusi  DO  Chief Complaint:  Cold sore top right lip area. Sneezing alot recently.Marland Kitchen  History of Present Illness: Elizabeth Woodward comes in today for follow-up on labs and 2 week follow-up on her Hypertension. No new complaints except for some sneezing and a cold sore on her upper lip which looks like it is almost completely healed.    Updated Prior Medication List: LISINOPRIL 40 MG TABS (LISINOPRIL) Take 1 tablet by mouth once a day HYDROCHLOROTHIAZIDE 25 MG TABS (HYDROCHLOROTHIAZIDE) Take 1 tablet by mouth once a day PEPCID COMPLETE  CHEW (FAMOTIDINE-CA CARB-MAG HYDROX CHEW) Take 1 tablet by mouth once a day CLONAZEPAM 0.5 MG  TABS (CLONAZEPAM) Take 1 to 1/2 tablet twice a day as needed for anxiety ASPIRIN 81 MG TABS (ASPIRIN) Take 1 tablet by mouth once a day OSCAL 500/200 D-3  TABS (CALCIUM-VITAMIN D TABS) three times a day CARVEDILOL 12.5 MG TABS (CARVEDILOL) Take 1 tablet by mouth two times a day FISH OIL 1000 MG CAPS (OMEGA-3 FATTY ACIDS) Take 1 tablet by mouth once a day MULTIVITAMINS  TABS (MULTIPLE VITAMIN) Take 1 tablet by mouth once a day BENEFIBER  TABS (WHEAT DEXTRIN) Take 1 tablet by mouth two times a day or as directed LOVASTATIN 10 MG TABS (LOVASTATIN) Take 1 tablet by mouth once a day  Current  Allergies (reviewed today): ! NIACIN (NIACIN) ! RECLAST (ZOLEDRONIC ACID) ADHESIVE BANDAGES PLASTIC (ADHESIVE BANDAGES)   Family History:    Mother: 'Heart problem'    Father: Atherosclerosis    Brother: Colon Ca and Lung cancer at 72yrs    Sister: Esophagus cancer.       Social History:    Marital Status: Married    Children: 1 son    Occupation: Retired    Former smoker   Risk Factors:  Tobacco use:  quit    Year quit:  2007  Mammogram History:    Date of Last Mammogram:  08/27/2006   Review of Systems      See HPI   Physical Exam  General:     alert, well-developed, well-nourished, and well-hydrated.   Lungs:     normal respiratory effort, no intercostal retractions, no accessory muscle use, normal breath sounds, no crackles, and no wheezes.   Heart:     normal rate and regular rhythm.   Abdomen:     soft, non-tender, and normal bowel sounds.   Skin:     no rashes and solar damage.   Psych:     Oriented X3 and normally interactive.      Impression & Recommendations:  Problem # 1:  MELANOMA (ICD-172.9) Need full Dermatologic Evaluation for Hx of melanoma. Discussed self skin examination. Performed full skin exam today- no obvious concerning lesions.  Orders: Dermatology Referral (Derma)   Problem # 2:  HYPERTENSION (ICD-401.9) [name] is not taking her BP meds at one time- she spreads them out throughout the day- an almost impossible task- she did not know she could take them all together at one time. I have worked out an AM and PM medication regimen for her and set her up with two pill boxes. Will recheck in 2 weeks. Med rec. done. I removed the clonidine because it was making her tired.  The following medications were removed from the medication list:    Clonidine Hcl 0.1 Mg Tabs (Clonidine hcl) .Marland Kitchen... Take 1 tablet by mouth two times a day  Her updated medication list for this problem includes:    Lisinopril 40 Mg Tabs (Lisinopril) .Marland Kitchen... Take  1 tablet by mouth once a day    Hydrochlorothiazide 25 Mg Tabs (Hydrochlorothiazide) .Marland Kitchen... Take 1 tablet by mouth once a day    Carvedilol 12.5 Mg Tabs (Carvedilol) .Marland Kitchen... Take 1 tablet by mouth two times a day  Orders: T-Basic Metabolic Panel [phone])  BP today: 147/73 Prior BP: 142/76 (05/14/2008)  Labs Reviewed: Creat: 1.36 (05/14/2008) Chol: 232 (02/23/2008)   HDL: 44 (02/23/2008)   LDL: 125 (02/23/2008)   TG: 313 (02/23/2008)   Problem # 3:  RENAL INSUFFICIENCY, CHRONIC (ICD-585.9) Will check BMET today- may need to reduce dose of Lisinopril.  Problem # 4:  ANEMIA, MILD (ICD-285.9) Will need to review labs and see if colonoscopy has been done. If not will check B12 and RBC Folate at her next visit as welll as complete anemia panel.  Problem # 5:  HYPONATREMIA (ICD-276.1) Repeat BMET today-if ongoing problem will need to bring back for a more intensive w/u which will include CT for occult malignancy.   Complete Medication List: 1)  Lisinopril 40 Mg Tabs (Lisinopril) .... Take 1 tablet by mouth once a day 2)  Hydrochlorothiazide 25 Mg Tabs (Hydrochlorothiazide) .... Take 1 tablet by mouth once a day 3)  Pepcid Complete Chew (Famotidine-ca carb-mag hydrox chew) .... Take 1 tablet by mouth once a day 4)  Clonazepam 0.5 Mg Tabs (Clonazepam) .... Take 1 to 1/2 tablet twice a day as needed for anxiety 5)  Aspirin 81 Mg Tabs (Aspirin) .... Take 1 tablet by mouth once a day 6)  Oscal 500/200 D-3 Tabs (Calcium-vitamin d tabs) .... Three times a day 7)  Carvedilol 12.5 Mg Tabs (Carvedilol) .... Take 1 tablet by mouth two times a day 8)  Fish Oil 1000 Mg Caps (Omega-3 fatty acids) .... Take 1 tablet by mouth once a day 9)  Multivitamins Tabs (Multiple vitamin) .... Take 1 tablet by mouth once a day 10)  Benefiber Tabs (Wheat dextrin) .... Take 1 tablet by mouth two times a day or as directed 11)  Lovastatin 10 Mg Tabs (Lovastatin) .... Take 1 tablet by mouth once a day   Patient  Instructions: 1)  Please schedule a follow-up appointment in 2-4 weeks.   ]

## 2010-09-05 NOTE — Miscellaneous (Signed)
Summary: HIPPA  HIPPA   Imported By: Louretta Parma 11/01/2008 16:54:36  _____________________________________________________________________  External Attachment:    Type:   Image     Comment:   External Document

## 2010-09-05 NOTE — Miscellaneous (Signed)
  Clinical Lists Changes  Medications: Added new medication of DOXYCYCLINE HYCLATE 100 MG  TABS (DOXYCYCLINE HYCLATE) Take 1 tablet by mouth two times a day - Signed Rx of DOXYCYCLINE HYCLATE 100 MG  TABS (DOXYCYCLINE HYCLATE) Take 1 tablet by mouth two times a day;  #20 x 0;  Signed;  Entered by: Harriett Sine Phifer MD;  Authorized by: Harriett Sine Phifer MD;  Method used: Electronic    Prescriptions: DOXYCYCLINE HYCLATE 100 MG  TABS (DOXYCYCLINE HYCLATE) Take 1 tablet by mouth two times a day  #20 x 0   Entered and Authorized by:   Ned Grace MD   Signed by:   Ned Grace MD on 03/15/2008   Method used:   Electronically sent to ...       [address]       [address]       [place], [place]  [address]       Ph: [phone]       Fax: [phone]   RxID:   [id number]

## 2010-09-05 NOTE — Assessment & Plan Note (Signed)
Summary: est-ck/fu/meds/cfb   Vital Signs:  Patient Profile:   70 Years Old Female Height:     58.5 inches (148.59 cm) Weight:      123.8 pounds BMI:     25.53 Temp:     97.3 degrees F Pulse rate:   92 / minute BP sitting:   151 / 79  (right arm)  Pt. in pain?   yes    Location:   both legs    Intensity:   6  Vitals Entered By: Dorie Rank RN (October 10, 2007 11:42 AM)              [name] Nutritional Status BMI of 25 - 29 = overweight  Have you ever been in a relationship where you felt threatened, hurt or afraid?No   Does patient need assistance? Functional Status Self care Ambulation Normal     PCP:  [facility]  Chief Complaint:  check up.  History of Present Illness: Ms. Rotunno is a 70 year old woman with osteoporosis, hypertension, and presumed osteoarthritis who presents to the office today for routine follow-up. Recently we completed a w/u for hypercalcemia and obtained a DEXA repeat scan.  Several months ago she recieved reclast injections for her Oseoporosis and had a serious reaction including profuse vomiting, flu-like symptoms, and diarrhea. She is since much better symptomatically but we are closely monitoring her electrolytes including her mag, phos, calcium and well as her renal and liver function.       Note: [phone] son's phone number-can leave message for patient.    Updated Prior Medication List: LISINOPRIL 40 MG TABS (LISINOPRIL) Take 1 tablet by mouth once a day HYDROCHLOROTHIAZIDE 25 MG TABS (HYDROCHLOROTHIAZIDE) Take 1 tablet by mouth once a day PEPCID COMPLETE  CHEW (FAMOTIDINE-CA CARB-MAG HYDROX CHEW) Take 1 tablet by mouth once a day CLONIDINE HCL 0.1 MG TABS (CLONIDINE HCL) Take 1 tablet by mouth two times a day CLONAZEPAM 0.5 MG  TABS (CLONAZEPAM) Take 1 to 1/2 tablet twice a day as needed for anxiety ASPIRIN 81 MG TABS (ASPIRIN) Take 1 tablet by mouth once a day OSCAL 500/200 D-3  TABS (CALCIUM-VITAMIN D TABS)  three times a day PRAVASTATIN SODIUM 40 MG TABS (PRAVASTATIN SODIUM) Take 1 tablet by mouth once a day MAGNESIUM OXIDE 400 MG  CAPS (MAGNESIUM OXIDE) Take 1 tablet by mouth two times a day  Current Allergies: ! NIACIN (NIACIN) ! RECLAST (ZOLEDRONIC ACID)    Risk Factors: Tobacco use:  quit    Year quit:  2007  Mammogram History:    Date of Last Mammogram:  08/27/2006   Review of Systems      See HPI   Physical Exam  General:     No distress Lungs:     CTAB Heart:     RRR no murmurs Abdomen:     Soft, Non-Tender, no distinct lymph nodes in groin, some scar tissue palpated along an old phanenstiel inscision. No edema or swelling. No masses. Msk:     Larey Seat on right knee-now swollen, painful to touch, visible effusion, not reddened, ROM decreased but tolerable, no ligment laxicity.    Impression & Recommendations:  Problem # 1:  PELVIC PAIN, RIGHT (ICD-789.09) Ongoing complaint, she appears to have a right sided fullness on exam, no specificalla hernia. Region of Right ovary no abnormalty on past pelvic exam.  Will obtain Transvaginal US today to assess for pelvic mass or lesion.  Her updated medication list for this problem  includes:    Aspirin 81 Mg Tabs (Aspirin) .Marland Kitchen... Take 1 tablet by mouth once a day  Orders: Ultrasound (Ultrasound) T-Vitamin D (25-Hydroxy) ([id number]) T-Urinalysis ([id number])   Problem # 2:  HYPERCALCEMIA (ICD-275.42) Probably from over supplementation of Os-cal, Will reduce her dose to one tablet daily and re-check in a few weeks.  Orders: T-Comprehensive Metabolic Panel ([id number])   Problem # 3:  KNEE PAIN (ICD-719.46) Discussed strengthening exercises, use of ice or heat, and medications- will follow.  Her updated medication list for this problem includes:    Aspirin 81 Mg Tabs (Aspirin) .Marland Kitchen... Take 1 tablet by mouth once a day    Problem # 4:  MELANOMA (ICD-172.9) Will obtain CT if needed for occult malignanct w/u is [place]  not revealing- patient has had no weight-loss or otehr symptoms of an internal malignancy-she is however a heavy smoker in the past. Will make sure she is up to date on all of her age specific screenings.  Complete Medication List: 1)  Lisinopril 40 Mg Tabs (Lisinopril) .... Take 1 tablet by mouth once a day 2)  Hydrochlorothiazide 25 Mg Tabs (Hydrochlorothiazide) .... Take 1 tablet by mouth once a day 3)  Pepcid Complete Chew (Famotidine-ca carb-mag hydrox chew) .... Take 1 tablet by mouth once a day 4)  Clonidine Hcl 0.1 Mg Tabs (Clonidine hcl) .... Take 1 tablet by mouth two times a day 5)  Clonazepam 0.5 Mg Tabs (Clonazepam) .... Take 1 to 1/2 tablet twice a day as needed for anxiety 6)  Aspirin 81 Mg Tabs (Aspirin) .... Take 1 tablet by mouth once a day 7)  Oscal 500/200 D-3 Tabs (Calcium-vitamin d tabs) .... Three times a day 8)  Pravastatin Sodium 40 Mg Tabs (Pravastatin sodium) .... Take 1 tablet by mouth once a day 9)  Magnesium Oxide 400 Mg Caps (Magnesium oxide) .... Take 1 tablet by mouth two times a day   Patient Instructions: 1)  Please schedule a follow-up appointment in 4 weeks- will call you with lab results.    ]

## 2010-09-05 NOTE — Assessment & Plan Note (Signed)
Summary: ACUTE-EST-CK/FU/MEDS/-PER DR GOLDING/CFB   Vital Signs:  Patient profile:   70 year old female Height:      58.5 inches Weight:      114.4 pounds BMI:     23.59 Temp:     97.7 degrees F oral Pulse rate:   70 / minute BP sitting:   143 / 79  (right arm)  Vitals Entered By: Filomena Jungling NT II (May 16, 2009 9:37 AM) CC: NEEDS REFILL [name] Pain Assessment Patient in pain? no      Nutritional Status BMI of 19 -24 = normal  Does patient need assistance? Functional Status Self care Ambulation Normal    Primary Care Provider:  Julaine Fusi  DO  CC:  NEEDS REFILL.  History of Present Illness: Here for follow up appointment. Had tried to schedule one earlier but could not come and so appointment made for today. No complaints except for being slightly sore from moving after the home she was renting was foreclosed. Has taken Tylenol for those aches and pains and it works well. Has been eating well and exercising and has lost 5 lbs. since last appointment.  Preventive Screening-Counseling & Management  Alcohol-Tobacco     Smoking Status: quit     Year Quit: 2007  Current Medications (verified): 1)  Lisinopril 40 Mg Tabs (Lisinopril) .... Take 1 Tablet By Mouth Once A Day 2)  Hydrochlorothiazide 25 Mg Tabs (Hydrochlorothiazide) .... Take 1 Tablet By Mouth Once A Day 3)  Pepcid Complete  Chew (Famotidine-Ca Carb-Mag Hydrox Chew) .... Take 1 Tablet By Mouth Once A Day 4)  Clonazepam 0.5 Mg  Tabs (Clonazepam) .... Take 1 To 1/2 Tablet Twice A Day As Needed For Anxiety 5)  Aspirin 81 Mg Tabs (Aspirin) .... Take 1 Tablet By Mouth Once A Day 6)  Oscal 500/200 D-3  Tabs (Calcium-Vitamin D Tabs) .... Take 1 Tablet By Mouth Once A Day 7)  Carvedilol 12.5 Mg Tabs (Carvedilol) .... Take 1 Tablet By Mouth Two Times A Day 8)  Omega-3 & Omega-6 Fish Oil  Caps (Omega 3-6-9 Fatty Acids) .... .qdtab 9)  Multivitamins  Tabs (Multiple Vitamin) .... Take 1 Tablet By  Mouth Once A Day 10)  Benefiber  Tabs (Wheat Dextrin) .... Take 1 Tablet By Mouth Two Times A Day or As Directed 11)  Vitamin D3 400 Unit Tabs (Cholecalciferol) .... Take 1 Tablet By Mouth Two Times A Day  Allergies (verified): 1)  ! Niacin (Niacin) 2)  ! Reclast (Zoledronic Acid) 3)  ! * Statin 4)  Adhesive Bandages Plastic (Adhesive Bandages)  Past History:  Past Medical History: Last updated: 05/14/2008 Anxiety GERD Hyperlipidemia 224/245/42/133 4/07, repeat this visit Hypertension Osteoporosis - pt on Oscal + Vit D, rc'd Reclast infusion in 2008. Angioedema - Sec to Niacin Venous insufficiency Hyponatremia - around 133 Tobacco abuse Melanoma, mole in back DEXA scan -osteoporosis on f/u DEXA in 1/ 2008  Past Surgical History: Last updated: 06/13/2006 Hysterectomy, partial  Family History: Last updated: 05/31/2008 Mother: 'Heart problem' Father: Atherosclerosis Brother: Colon Ca and Lung cancer at 35yrs Sister: Esophagus cancer.   Social History: Last updated: 05/16/2009 Marital Status: Divorced for years. Children: 1 son, 76. 1 daughter, 58. Occupation: Retired, worked different jobs in past, mostly retail Former smoker  Risk Factors: Smoking Status: quit (05/16/2009)  Social History: Marital Status: Divorced for years. Children: 1 son, 72. 1 daughter, 21. Occupation: Retired, worked different jobs in past, mostly retail Former smoker  Review of  Systems General:  Denies chills, fatigue, fever, loss of appetite, and weakness. Eyes:  Denies blurring and vision loss-both eyes. CV:  Denies chest pain or discomfort, difficulty breathing at night, difficulty breathing while lying down, fatigue, palpitations, swelling of feet, and swelling of hands. Resp:  Denies cough and wheezing. GI:  Denies abdominal pain, bloody stools, constipation, diarrhea, nausea, and vomiting. Derm:  Denies lesion(s) and rash.  Physical Exam  General:  alert, well-developed,  well-nourished, well-hydrated, appropriate dress, normal appearance, healthy-appearing, cooperative to examination, and good hygiene.   Head:  normocephalic and atraumatic.   Eyes:  vision grossly intact, pupils equal, pupils round, and pupils reactive to light.   Ears:  no external deformities.   Nose:  no external deformity.   Mouth:  pharynx pink and moist, no erythema, and no exudates.  False upper teeth noted. Lungs:  normal respiratory effort, no intercostal retractions, no accessory muscle use, normal breath sounds, no crackles, and no wheezes.   Heart:  normal rate, regular rhythm, no murmur, no gallop, and no rub.   Abdomen:  soft, non-tender, normal bowel sounds, no guarding, and no rigidity.   Pulses:  R radial normal and L radial normal.   Neurologic:  alert & oriented X3 and cranial nerves II-XII intact.   Skin:  turgor normal, color normal, no rashes, and no suspicious lesions on back. Scar noted where previous melanoma was excised years ago.   Psych:  Oriented X3, memory intact for recent and remote, normally interactive, good eye contact, not anxious appearing, and not depressed appearing.   Additional Exam:  Recheck of BP- first attempt: 132/76, second attempt: 128/72.   Impression & Recommendations:  Problem # 1:  HYPERLIPIDEMIA (ICD-272.4) Labwork in August - LDL at 181 and goal should be <100. Patient intolerant to niacin and statins. On fish oil 1200 mg daily. Had spoken with Dr. Phillips Odor before and was thinking of adding another medication to help with cholesterol, although patient cannot remember what they discussed. WIll discuss possibility of ezetimibe as cholesterol lowering agentand schedule follow up with Dr. Phillips Odor to discuss further.  Labs Reviewed: SGOT: 17 (03/08/2009)   SGPT: 11 (03/08/2009)  Lipid Goals: LDL Goal: 100 (05/16/2009)     10 Yr Risk Heart Disease: 17 %   HDL:51 (03/08/2009), 44 (02/23/2008)  LDL:181 (03/08/2009), 125 (02/23/2008)  Chol:261  (03/08/2009), 232 (02/23/2008)  Trig:145 (03/08/2009), 313 (02/23/2008)  Problem # 2:  HYPERTENSION (ICD-401.9) Initial BP taken above goal at 143/79. BP rechecked during visit and two checks averaged about 130/76. Borderline, but at goal. Patient states that blood pressure fluctuates when checked at home. No changes at this visit. Encouraged patient to continue to check BP at home and congratulated pt.on progress with diet and exercise.  Her updated medication list for this problem includes:    Lisinopril 40 Mg Tabs (Lisinopril) .Marland Kitchen... Take 1 tablet by mouth once a day    Hydrochlorothiazide 25 Mg Tabs (Hydrochlorothiazide) .Marland Kitchen... Take 1 tablet by mouth once a day    Carvedilol 12.5 Mg Tabs (Carvedilol) .Marland Kitchen... Take 1 tablet by mouth two times a day  BP today: 143/79 Prior BP: 160/70 (12/06/2008)  10 Yr Risk Heart Disease: 17 %  Labs Reviewed: K+: 5.0 (03/08/2009) Creat: : 1.12 (03/08/2009)   Chol: 261 (03/08/2009)   HDL: 51 (03/08/2009)   LDL: 181 (03/08/2009)   TG: 145 (03/08/2009)  Problem # 3:  MUSCLE PAIN (ICD-729.1) Patient states that she has had some aches and pains, mostly in lower back  following a recent move. She states that this pain is controlled with  Tylenol as needed. No further workup warranted at this visit.  Her updated medication list for this problem includes:    Aspirin 81 Mg Tabs (Aspirin) .Marland Kitchen... Take 1 tablet by mouth once a day  Problem # 4:  Preventive Health Care (ICD-V70.0) Patient agrees to flu shot. Had colonscopy years ago - maybe 8-10 yrs ago per pt. at [facility]. Has debt with them and cannot be seen again there until debt is cleared which she cannot afford at this time ( ~ $200). Had hysterectomy in past - no pap indicated. Will give patient Hemoccult cards for occult GI blood testing.  Problem # 5:  HYPONATREMIA (ICD-276.1) August labs with sodium of 125. Will re-evaluate with BMET today.  Orders: T-Basic Metabolic Panel [id number])  Problem # 6:   ANEMIA, MILD (ICD-285.9) Mild anemia in August labs. Will recheck with anemia panel today.  Orders: T-CBC No Diff ([id number]) T-Iron [phone]) T-Ferritin [id number]) T-Iron Binding Capacity (TIBC) ([id number]) T-Folic Acid; RBC ([id number]) T-Vitamin B12 (40347-42595)  Complete Medication List: 1)  Lisinopril 40 Mg Tabs (Lisinopril) .... Take 1 tablet by mouth once a day 2)  Hydrochlorothiazide 25 Mg Tabs (Hydrochlorothiazide) .... Take 1 tablet by mouth once a day 3)  Pepcid Complete Chew (Famotidine-ca carb-mag hydrox chew) .... Take 1 tablet by mouth once a day 4)  Clonazepam 0.5 Mg Tabs (Clonazepam) .... Take 1 to 1/2 tablet twice a day as needed for anxiety 5)  Aspirin 81 Mg Tabs (Aspirin) .... Take 1 tablet by mouth once a day 6)  Oscal 500/200 D-3 Tabs (Calcium-vitamin d tabs) .... Take 1 tablet by mouth once a day 7)  Carvedilol 12.5 Mg Tabs (Carvedilol) .... Take 1 tablet by mouth two times a day 8)  Omega-3 & Omega-6 Fish Oil Caps (Omega 3-6-9 fatty acids) .... Take 1 tablet by mouth once a day 9)  Multivitamins Tabs (Multiple vitamin) .... Take 1 tablet by mouth once a day 10)  Benefiber Tabs (Wheat dextrin) .... Take 1 tablet by mouth two times a day or as directed 11)  Vitamin D3 400 Unit Tabs (Cholecalciferol) .... Take 1 tablet by mouth two times a day  Other Orders: T-Hemoccult Card-Multiple (take home) ([id number]) Flu Vaccine 58yrs + (64332) Administration Flu vaccine - MCR ([id number])  Patient Instructions: 1)  Please schedule a follow-up appointment in 2 months with Dr. Phillips Odor. 2)  Check your Blood Pressure regularly. 3)  Complete your Hemocult cards and return them soon.  Prevention & Chronic Care Immunizations   Influenza vaccine: Fluvax 3+  (05/16/2009)    Tetanus booster: Not documented    Pneumococcal vaccine: Not documented    H. zoster vaccine: Not documented  Colorectal Screening   Hemoccult: Not documented   Hemoccult action/deferral: Ordered   (05/16/2009)    Colonoscopy: Not documented  Other Screening   Pap smear: Not documented   Pap smear action/deferral: Not indicated S/P hysterectomy  (05/16/2009)    Mammogram: ASSESSMENT: Negative - BI-RADS 1^MM DIGITAL SCREENING  (11/09/2008)   Mammogram action/deferral: mammogram ordered  (11/01/2008)   Mammogram due: 11/09/2009    DXA bone density scan: Not documented   DXA scan due: 09/2008   Reports requested:   Last colonoscopy report requested.  Smoking status: quit  (05/16/2009)    Screening comments: Last colonscopy with [facility] about 9-10 years ago - owes money (?$200) and cannot return until bill pain  Lipids   Total Cholesterol: 261  (  03/08/2009)   LDL: 181  (03/08/2009)   LDL Direct: Not documented   HDL: 51  (03/08/2009)   Triglycerides: 145  (03/08/2009)    SGOT (AST): 17  (03/08/2009)   SGPT (ALT): 11  (03/08/2009)   Alkaline phosphatase: 82  (03/08/2009)   Total bilirubin: 0.5  (03/08/2009)    Lipid flowsheet reviewed?: Yes   Progress toward LDL goal: Deteriorated  Hypertension   Last Blood Pressure: 143 / 79  (05/16/2009)   Serum creatinine: 1.12  (03/08/2009)   Serum potassium 5.0  (03/08/2009)    Hypertension flowsheet reviewed?: Yes   Progress toward BP goal: Improved  Self-Management Support :   Personal Goals (by the next clinic visit) :      Personal blood pressure goal: 130/80  (05/16/2009)     Personal LDL goal: 100  (05/16/2009)    Patient will work on the following items until the next clinic visit to reach self-care goals:     Medications and monitoring: take my medicines every day, bring all of my medications to every visit  (05/16/2009)     Eating: eat more vegetables, eat foods that are low in salt, eat baked foods instead of fried foods  (05/16/2009)     Activity: take a 30 minute walk every day  (05/16/2009)    Hypertension self-management support: Written self-care plan  (05/16/2009)   Hypertension self-care plan  printed.    Lipid self-management support: Written self-care plan  (05/16/2009)   Lipid self-care plan printed.   Nursing Instructions: Give Flu vaccine today Provide Hemoccult cards with instructions (see order) Request report of last colonoscopy  Flu Vaccine Consent Questions     Do you have a history of severe allergic reactions to this vaccine? no    Any prior history of allergic reactions to egg and/or gelatin? no    Do you have a sensitivity to the preservative Thimersol? no    Do you have a past history of Guillan-Barre Syndrome? no    Do you currently have an acute febrile illness? no    Have you ever had a severe reaction to latex? no    Vaccine information given and explained to patient? yes    Are you currently pregnant? no    Lot [employer] [id number]  [employer]   Exp Date:11/03/2009   Site Given  Left Deltoid IM Chinita Pester RN  May 16, 2009 10:55 AM  Nursing Instructions: Give Flu vaccine today Provide Hemoccult cards with instructions (see order) Request report of last colonoscopy  .medflu  Process Orders Check Orders Results:     [facility]: Check successful Tests Sent for requisitioning (May 16, 2009 2:08 PM):     05/16/2009: [facility] -- T-Basic Metabolic Panel [id number] (signed)     05/16/2009: [facility] -- T-CBC No Diff [[id number]] (signed)     05/16/2009: [facility] -- Augusto Gamble [[id number]] (signed)     05/16/2009: [facility] -- T-Ferritin [[id number]] (signed)     05/16/2009: [facility] -- T-Iron Binding Capacity (TIBC) [[id number]] (signed)     05/16/2009: [facility] -- T-Folic Acid; RBC [[id number]] (signed)     05/16/2009: [facility] -- T-Vitamin B12 [[id number]] (signed)

## 2010-09-05 NOTE — Assessment & Plan Note (Signed)
Summary: MONTHLY B12 SHOT/CH  Nurse Visit   Allergies: 1)  ! Niacin (Niacin) 2)  ! Reclast (Zoledronic Acid) 3)  ! * Statin 4)  Adhesive Bandages Plastic (Adhesive Bandages)  Medication Administration  Injection # 1:    Medication: Vit B12 1000 mcg    Diagnosis: ANEMIA, VITAMIN B12 DEFICIENCY (ICD-281.1)    Route: IM    Site: L deltoid    Exp Date: 03/2012    Lot #: [id number]    Mfr: [employer]    Patient tolerated injection without complications    Given by: Marin Roberts RN (June 27, 2010 10:48 AM)  Orders Added: 1)  Admin of Therapeutic Inj  intramuscular or subcutaneous [[id number]] 2)  Vit B12 1000 mcg [J3420]

## 2010-09-05 NOTE — Assessment & Plan Note (Signed)
Summary: 79month fu/est/vs   Vital Signs:  Patient Profile:   70 Years Old Female Height:     58.5 inches (148.59 cm) Weight:      124.02 pounds (56.37 kg) BMI:     25.57 Temp:     97.9 degrees F (36.61 degrees C) oral Pulse rate:   100 / minute BP sitting:   140 / 80  (left arm)  Pt. in pain?   no  Vitals Entered By: Angelina Ok RN (July 08, 2008 10:38 AM)              [name] Nutritional Status BMI of 25 - 29 = overweight  Have you ever been in a relationship where you felt threatened, hurt or afraid?No   Does patient need assistance? Functional Status Self care Ambulation Normal     PCP:  Julaine Fusi  DO  Chief Complaint:  Check up.  History of Present Illness: Ms. Anglin comes in for follow-up on her hypertension and hyperlipidemia. She has been tolerating the Lovastatin well. No problems. She is taking meds now as prescribed except that she did not take ther Coreg this morning because she was fasting and the "bottle says take with food'. She does periodically have some dizziness and stomach upset presumed by the patient to be from the coreg. Other wise she says she feels "great". She has been taking the vitamin D and thinks she has more energy.    Updated Prior Medication List: LISINOPRIL 40 MG TABS (LISINOPRIL) Take 1 tablet by mouth once a day HYDROCHLOROTHIAZIDE 25 MG TABS (HYDROCHLOROTHIAZIDE) Take 1 tablet by mouth once a day PEPCID COMPLETE  CHEW (FAMOTIDINE-CA CARB-MAG HYDROX CHEW) Take 1 tablet by mouth once a day CLONAZEPAM 0.5 MG  TABS (CLONAZEPAM) Take 1 to 1/2 tablet twice a day as needed for anxiety ASPIRIN 81 MG TABS (ASPIRIN) Take 1 tablet by mouth once a day OSCAL 500/200 D-3  TABS (CALCIUM-VITAMIN D TABS) Take 1 tablet by mouth once a day CARVEDILOL 12.5 MG TABS (CARVEDILOL) Take 1 tablet by mouth two times a day FISH OIL 1000 MG CAPS (OMEGA-3 FATTY ACIDS) Take 1 tablet by mouth once a day MULTIVITAMINS  TABS (MULTIPLE  VITAMIN) Take 1 tablet by mouth once a day BENEFIBER  TABS (WHEAT DEXTRIN) Take 1 tablet by mouth two times a day or as directed LOVASTATIN 10 MG TABS (LOVASTATIN) Take 1 tablet by mouth once a day VITAMIN D3 400 UNIT TABS (CHOLECALCIFEROL) Take 1 tablet by mouth two times a day  Current Allergies: ! NIACIN (NIACIN) ! RECLAST (ZOLEDRONIC ACID) ADHESIVE BANDAGES PLASTIC (ADHESIVE BANDAGES)  Past Medical History:    Reviewed history from 05/14/2008 and no changes required:       Anxiety       GERD       Hyperlipidemia 224/245/42/133 4/07, repeat this visit       Hypertension       Osteoporosis - pt on Oscal + Vit D, rc'd Reclast infusion in 2008.       Angioedema - Sec to Niacin       Venous insufficiency       Hyponatremia - around 133       Tobacco abuse       Melanoma, mole in back       DEXA scan -osteoporosis on f/u DEXA in 1/ 2008   Family History:    Reviewed history from 05/31/2008 and no changes required:  Mother: 'Heart problem'       Father: Atherosclerosis       Brother: Colon Ca and Lung cancer at 90yrs       Sister: Esophagus cancer.          Social History:    Reviewed history from 05/31/2008 and no changes required:       Marital Status: Married       Children: 1 son       Occupation: Retired       Former smoker   Risk Factors: Tobacco use:  quit    Year quit:  2007  Mammogram History:    Date of Last Mammogram:  08/27/2006   Review of Systems      See HPI   Physical Exam  General:     alert, well-developed, well-nourished, and well-hydrated.   Lungs:     normal respiratory effort, no intercostal retractions, no accessory muscle use, normal breath sounds, no crackles, and no wheezes.   Heart:     normal rate and regular rhythm.   Abdomen:     soft, non-tender, and normal bowel sounds.   Msk:     intact-moves all 4 extremities. OA changes bilateral hands, no knee effusions Extremities:     no edema Neurologic:     alert & oriented  X3, cranial nerves II-XII intact, strength normal in all extremities, and sensation intact to light touch.   Psych:     Oriented X3 and normally interactive.      Impression & Recommendations:  Problem # 1:  HYPERTENSION (ICD-401.9) Borderline elevated- I am guessing that when she takes the Coreg that she is in a normal range. Will recheck in one month. I am also going to repeat a BMET- her renal function had been borderline high and she is on a max dose of Lisinopril -If still high I will consider lowering her dose of Lisinopril or at least diving it into twice daily dsing.  Her updated medication list for this problem includes:    Lisinopril 40 Mg Tabs (Lisinopril) .Marland Kitchen... Take 1 tablet by mouth once a day    Hydrochlorothiazide 25 Mg Tabs (Hydrochlorothiazide) .Marland Kitchen... Take 1 tablet by mouth once a day    Carvedilol 12.5 Mg Tabs (Carvedilol) .Marland Kitchen... Take 1 tablet by mouth two times a day  BP today: 140/80 Prior BP: 147/73 (05/31/2008)  Labs Reviewed: Creat: 1.02 (05/31/2008) Chol: 232 (02/23/2008)   HDL: 44 (02/23/2008)   LDL: 125 (02/23/2008)   TG: 313 (02/23/2008)  Orders: T-Basic Metabolic Panel [id number])   Problem # 2:  RENAL INSUFFICIENCY, CHRONIC (ICD-585.9) Will repeat BMET today. Consider lowering Lisinopril dosing.  Problem # 3:  OSTEOPOROSIS (ICD-733.00) Repleting her vitamin D- will recheck her level in Feb. Between her Centrum Silver, Her D3, and Oscal she is getting about 1500 mg a day.  Her updated medication list for this problem includes:    Oscal 500/200 D-3 Tabs (Calcium-vitamin d tabs) .Marland Kitchen... Take 1 tablet by mouth once a day    Vitamin D3 400 Unit Tabs (Cholecalciferol) .Marland Kitchen... Take 1 tablet by mouth two times a day   Problem # 4:  PELVIC PAIN, RIGHT (ICD-789.09) resolved. Her updated medication list for this problem includes:    Aspirin 81 Mg Tabs (Aspirin) .Marland Kitchen... Take 1 tablet by mouth once a day   Problem # 5:  TRANSIENT ISCHEMIC ATTACK  (ICD-435.9) Ruled out- I doubt this was an actual TIA. I think her symptoms were likely related to medication  side effects. w/u with [facility] was negative.  Her updated medication list for this problem includes:    Aspirin 81 Mg Tabs (Aspirin) .Marland Kitchen... Take 1 tablet by mouth once a day   Problem # 6:  HYPERCALCEMIA (ICD-275.42) Following with BMET. Essentiall resolved.  Problem # 7:  ABDOMINAL PAIN OTHER SPECIFIED SITE (ICD-789.09) resolved. Her updated medication list for this problem includes:    Aspirin 81 Mg Tabs (Aspirin) .Marland Kitchen... Take 1 tablet by mouth once a day   Problem # 8:  ANEMIA, MILD (ICD-285.9) resolved-following.  Problem # 9:  DYSURIA (ICD-788.1) resolved.  Problem # 10:  HYPERLIPIDEMIA (ICD-272.4) no change continue in addition to Fish oil.  Her updated medication list for this problem includes:    Lovastatin 10 Mg Tabs (Lovastatin) .Marland Kitchen... Take 1 tablet by mouth once a day  Labs Reviewed: Chol: 232 (02/23/2008)   HDL: 44 (02/23/2008)   LDL: 125 (02/23/2008)   TG: 313 (02/23/2008) SGOT: 16 (05/14/2008)   SGPT: 11 (05/14/2008)   Complete Medication List: 1)  Lisinopril 40 Mg Tabs (Lisinopril) .... Take 1 tablet by mouth once a day 2)  Hydrochlorothiazide 25 Mg Tabs (Hydrochlorothiazide) .... Take 1 tablet by mouth once a day 3)  Pepcid Complete Chew (Famotidine-ca carb-mag hydrox chew) .... Take 1 tablet by mouth once a day 4)  Clonazepam 0.5 Mg Tabs (Clonazepam) .... Take 1 to 1/2 tablet twice a day as needed for anxiety 5)  Aspirin 81 Mg Tabs (Aspirin) .... Take 1 tablet by mouth once a day 6)  Oscal 500/200 D-3 Tabs (Calcium-vitamin d tabs) .... Take 1 tablet by mouth once a day 7)  Carvedilol 12.5 Mg Tabs (Carvedilol) .... Take 1 tablet by mouth two times a day 8)  Fish Oil 1000 Mg Caps (Omega-3 fatty acids) .... Take 1 tablet by mouth once a day 9)  Multivitamins Tabs (Multiple vitamin) .... Take 1 tablet by mouth once a day 10)  Benefiber Tabs (Wheat  dextrin) .... Take 1 tablet by mouth two times a day or as directed 11)  Lovastatin 10 Mg Tabs (Lovastatin) .... Take 1 tablet by mouth once a day 12)  Vitamin D3 400 Unit Tabs (Cholecalciferol) .... Take 1 tablet by mouth two times a day   Patient Instructions: 1)  Follow-up in Feb clinic w/[facility]   ]

## 2010-09-05 NOTE — Assessment & Plan Note (Signed)
Summary: INJECTION/DS  Nurse Visit   Allergies: 1)  ! Niacin (Niacin) 2)  ! Reclast (Zoledronic Acid) 3)  ! * Statin 4)  Adhesive Bandages Plastic (Adhesive Bandages)  Medication Administration  Injection # 1:    Medication: Vit B12 1000 mcg    Diagnosis: ANEMIA, VITAMIN B12 DEFICIENCY (ICD-281.1)    Route: IM    Site: L deltoid    Exp Date: 12/2011    Lot #: [id number]    Mfr: [employer]    Comments: Pt. states she will wait until her appt. w/ Dr. Phillips Odor 05/02/10 for next injection.    Patient tolerated injection without complications    Given by: Chinita Pester RN (April 27, 2010 9:28 AM)  Orders Added: 1)  Admin of Therapeutic Inj  intramuscular or subcutaneous [96372] 2)  Vit B12 1000 mcg [J3420]

## 2010-09-05 NOTE — Assessment & Plan Note (Signed)
Summary: est-ck/fu/meds/cfb   Vital Signs:  Patient profile:   70 year old female Height:      58.5 inches (148.59 cm) Weight:      120.06 pounds (54.57 kg) Temp:     97.1 degrees F (36.17 degrees C) oral Pulse rate:   78 / minute BP sitting:   141 / 78  (right arm) Cuff size:   regular  Vitals Entered By: Cynda Familia Duncan Dull) (January 12, 2010 10:53 AM) CC: Pt states that she has no complaints today.   [name] Pain Assessment Patient in pain? no       Have you ever been in a relationship where you felt threatened, hurt or afraid?No   Does patient need assistance? Functional Status Self care Ambulation Normal   Primary Care Provider:  Julaine Fusi  DO  CC:  Pt states that she has no complaints today.  Marland Kitchen  History of Present Illness: Ms. Lagunes is in for routine follow-up today. She has no complaints except for tiredness and fatigue. She has been under unusual stress about a very close friend who has been ill and put into a nursing home. She visits her friend almost daily and this has been quite draining on her.  Preventive Screening-Counseling & Management  Alcohol-Tobacco     Smoking Status: current     Smoking Cessation Counseling: yes     Packs/Day: once weekly, 1 cigarette  Current Medications (verified): 1)  Lisinopril 40 Mg Tabs (Lisinopril) .... Take 1 Tablet By Mouth Once A Day 2)  Hydrochlorothiazide 25 Mg Tabs (Hydrochlorothiazide) .... Take 1 Tablet By Mouth Once A Day 3)  Pepcid Complete  Chew (Famotidine-Ca Carb-Mag Hydrox Chew) .... Take 1 Tablet By Mouth Once A Day 4)  Clonazepam 0.5 Mg  Tabs (Clonazepam) .... Take 1 To 1/2 Tablet Twice A Day As Needed For Anxiety 5)  Aspirin 81 Mg Tabs (Aspirin) .... Take 1 Tablet By Mouth Once A Day 6)  Oscal 500/200 D-3  Tabs (Calcium-Vitamin D Tabs) .... Take 1 Tablet By Mouth Once A Day 7)  Carvedilol 12.5 Mg Tabs (Carvedilol) .... Take 1 Tablet By Mouth Two Times A Day 8)  Omega-3 & Omega-6 Fish  Oil  Caps (Omega 3-6-9 Fatty Acids) .... Take 1 Tablet By Mouth Once A Day 9)  Multivitamins  Tabs (Multiple Vitamin) .... Take 1 Tablet By Mouth Once A Day 10)  Benefiber  Tabs (Wheat Dextrin) .... Take 1 Tablet By Mouth Two Times A Day or As Directed 11)  Vitamin D3 400 Unit Tabs (Cholecalciferol) .... Take 1 Tablet By Mouth Two Times A Day  Allergies: 1)  ! Niacin (Niacin) 2)  ! Reclast (Zoledronic Acid) 3)  ! * Statin 4)  Adhesive Bandages Plastic (Adhesive Bandages)  Social History: Smoking Status:  current Packs/Day:  once weekly, 1 cigarette  Review of Systems      See HPI  Physical Exam  General:  alert, well-developed, well-nourished, well-hydrated, appropriate dress, normal appearance, healthy-appearing, cooperative to examination, and good hygiene.   Lungs:  normal respiratory effort, no intercostal retractions, no accessory muscle use, normal breath sounds, no crackles, and no wheezes.   Heart:  normal rate, regular rhythm, no murmur, no gallop, and no rub.   Abdomen:  soft, non-tender, normal bowel sounds, no guarding, and no rigidity.   Neurologic:  alert & oriented X3 and cranial nerves II-XII intact.   Skin:  turgor normal, color normal, no rashes, and no suspicious lesions on  back. Scar noted where previous melanoma was excised years ago.   Psych:  Oriented X3, memory intact for recent and remote, normally interactive, good eye contact, not anxious appearing, and not depressed appearing.     Impression & Recommendations:  Problem # 1:  HYPERLIPIDEMIA (ICD-272.4) Will check lipids panel today. Hx of statin intolerance. Has been taking fish oil.   Orders: T-Lipid Profile [id number]) T-Basic Metabolic Panel [id number])  Problem # 2:  HYPERTENSION (ICD-401.9) Slightly higher than usual today but still in a good range.  Her updated medication list for this problem includes:    Lisinopril 40 Mg Tabs (Lisinopril) .Marland Kitchen... Take 1 tablet by mouth once a day     Hydrochlorothiazide 25 Mg Tabs (Hydrochlorothiazide) .Marland Kitchen... Take 1 tablet by mouth once a day    Carvedilol 12.5 Mg Tabs (Carvedilol) .Marland Kitchen... Take 1 tablet by mouth two times a day  BP today: 141/78 Prior BP: 134/71 (08/25/2009)  Prior 10 Yr Risk Heart Disease: 17 % (05/16/2009)  Labs Reviewed: K+: 4.8 (08/26/2009) Creat: : 1.04 (08/26/2009)   Chol: 200 (08/26/2009)   HDL: 39 (08/26/2009)   LDL: 123 (08/26/2009)   TG: 192 (08/26/2009)  Problem # 3:  ANXIETY (ICD-300.00) Continue daily bzd. Very effective no problems with meds. Her updated medication list for this problem includes:    Clonazepam 0.5 Mg Tabs (Clonazepam) .Marland Kitchen... Take 1 to 1/2 tablet twice a day as needed for anxiety  Problem # 4:  ANEMIA, MILD (ICD-285.9) Will repeat labs today. Mildly low Hb last visit. Check B12, Folate, ferritin.  Orders: T-CBC w/Diff ([id number]) T-Ferritin [id number])  Complete Medication List: 1)  Lisinopril 40 Mg Tabs (Lisinopril) .... Take 1 tablet by mouth once a day 2)  Hydrochlorothiazide 25 Mg Tabs (Hydrochlorothiazide) .... Take 1 tablet by mouth once a day 3)  Pepcid Complete Chew (Famotidine-ca carb-mag hydrox chew) .... Take 1 tablet by mouth once a day 4)  Clonazepam 0.5 Mg Tabs (Clonazepam) .... Take 1 to 1/2 tablet twice a day as needed for anxiety 5)  Aspirin 81 Mg Tabs (Aspirin) .... Take 1 tablet by mouth once a day 6)  Oscal 500/200 D-3 Tabs (Calcium-vitamin d tabs) .... Take 1 tablet by mouth once a day 7)  Carvedilol 12.5 Mg Tabs (Carvedilol) .... Take 1 tablet by mouth two times a day 8)  Omega-3 & Omega-6 Fish Oil Caps (Omega 3-6-9 fatty acids) .... Take 1 tablet by mouth once a day 9)  Multivitamins Tabs (Multiple vitamin) .... Take 1 tablet by mouth once a day 10)  Benefiber Tabs (Wheat dextrin) .... Take 1 tablet by mouth two times a day or as directed 11)  Vitamin D3 400 Unit Tabs (Cholecalciferol) .... Take 1 tablet by mouth two times a day  Other Orders: T-TSH  ([id number]) T-Vitamin B12 ([id number])  Patient Instructions: 1)  Please schedule a follow-up appointment in 3 months. 2)  We will callyou with lab results Prescriptions: CLONAZEPAM 0.5 MG  TABS (CLONAZEPAM) Take 1 to 1/2 tablet twice a day as needed for anxiety  #30 x 5   Entered and Authorized by:   Julaine Fusi  DO   Signed by:   Julaine Fusi  DO on 01/12/2010   Method used:   Print then Give to Patient   RxID:   [id number] CARVEDILOL 12.5 MG TABS (CARVEDILOL) Take 1 tablet by mouth two times a day  #60 x 11   Entered and Authorized by:   Julaine Fusi  DO   Signed by:  Julaine Fusi  DO on 01/12/2010   Method used:   Print then Give to Patient   RxID:   [id number]   Prevention & Chronic Care Immunizations   Influenza vaccine: Fluvax 3+  (05/16/2009)   Influenza vaccine deferral: Deferred  (01/12/2010)   Influenza vaccine due: 04/06/2010    Tetanus booster: Not documented    Pneumococcal vaccine: Not documented    H. zoster vaccine: Not documented  Colorectal Screening   Hemoccult: Not documented   Hemoccult action/deferral: Deferred  (08/25/2009)    Colonoscopy: Not documented  Other Screening   Pap smear: Not documented   Pap smear action/deferral: Not indicated S/P hysterectomy  (05/16/2009)    Mammogram: ASSESSMENT: Negative - BI-RADS 1^MM DIGITAL SCREENING  (11/09/2008)   Mammogram action/deferral: mammogram ordered  (11/01/2008)   Mammogram due: 11/09/2009    DXA bone density scan: Not documented   DXA scan due: 09/2008    Smoking status: current  (01/12/2010)   Smoking cessation counseling: yes  (01/12/2010)  Lipids   Total Cholesterol: 200  (08/26/2009)   Lipid panel action/deferral: Lipid Panel ordered   LDL: 123  (08/26/2009)   LDL Direct: Not documented   HDL: 39  (08/26/2009)   Triglycerides: 192  (08/26/2009)    SGOT (AST): 15  (08/26/2009)   BMP action: Ordered   SGPT (ALT): 12  (08/26/2009)   Alkaline phosphatase: 78   (08/26/2009)   Total bilirubin: 0.4  (08/26/2009)  Hypertension   Last Blood Pressure: 141 / 78  (01/12/2010)   Serum creatinine: 1.04  (08/26/2009)   Serum potassium 4.8  (08/26/2009)  Self-Management Support :   Personal Goals (by the next clinic visit) :      Personal blood pressure goal: 130/80  (05/16/2009)     Personal LDL goal: 100  (05/16/2009)    Patient will work on the following items until the next clinic visit to reach self-care goals:     Medications and monitoring: take my medicines every day, check my blood pressure  (01/12/2010)     Eating: drink diet soda or water instead of juice or soda, eat more vegetables, use fresh or frozen vegetables, eat foods that are low in salt, eat baked foods instead of fried foods, eat fruit for snacks and desserts, limit or avoid alcohol  (01/12/2010)     Activity: take a 30 minute walk every day  (01/12/2010)    Hypertension self-management support: Education handout, Psychologist, forensic, Resources for patients handout, Written self-care plan  (01/12/2010)   Hypertension self-care plan printed.   Hypertension education handout printed    Lipid self-management support: Pre-printed educational material, Resources for patients handout, Written self-care plan  (01/12/2010)   Lipid self-care plan printed.      Resource handout printed.  Process Orders Check Orders Results:     [facility]: Check successful Tests Sent for requisitioning (January 16, 2010 12:51 PM):     01/12/2010: [facility] -- T-Lipid Profile [phone] (signed)     01/12/2010: [facility] -- T-Basic Metabolic Panel [id number] (signed)     01/12/2010: [facility] -- T-CBC w/Diff [[id number]] (signed)     01/12/2010: [facility] -- T-TSH [phone] (signed)     01/12/2010: [facility] -- T-Vitamin B12 [phone] (signed)     01/12/2010: [facility] -- T-Ferritin [phone] (signed)

## 2010-09-05 NOTE — Letter (Signed)
Summary: [facility]  [facility]charge [facility]   Imported By: Florinda Marker 02/20/2007 10:45:43  _____________________________________________________________________  External Attachment:    Type:   Image     Comment:   External Document

## 2010-09-05 NOTE — Progress Notes (Signed)
Summary: Refill/gh  Phone Note Refill Request Message from:  Pharmacy on December 01, 2007 11:37 AM  Refills Requested: Medication #1:  HYDROCHLOROTHIAZIDE 25 MG TABS Take 1 tablet by mouth once a day   Last Refilled: 10/30/2007  Medication #2:  CLONIDINE HCL 0.1 MG TABS Take 1 tablet by mouth two times a day   Last Refilled: 10/30/2007  Medication #3:  LISINOPRIL 40 MG TABS Take 1 tablet by mouth once a day   Last Refilled: 10/30/2007  Medication #4:  CLONAZEPAM 0.5 MG  TABS Take 1 to 1/2 tablet twice a day as needed for anxiety   Last Refilled: 11/04/2007  Method Requested: Electronic Initial call taken by: Angelina Ok RN,  December 01, 2007 11:39 AM  Follow-up for Phone Call        Refill approved-nurse to complete. Clonazapam refilled on 11/03/07 with 4 refills so hold refill until due. Follow-up by: Julaine Fusi  DO,  December 03, 2007 12:23 PM      Prescriptions: CLONAZEPAM 0.5 MG  TABS (CLONAZEPAM) Take 1 to 1/2 tablet twice a day as needed for anxiety  #30 x 4   Entered and Authorized by:   Julaine Fusi  DO   Signed by:   Julaine Fusi  DO on 12/18/2007   Method used:   Electronically sent to ...       [address]       [address]       [place], [place]  [address]       Ph: [phone]       Fax: [phone]   RxID:   [id number] CLONIDINE HCL 0.1 MG TABS (CLONIDINE HCL) Take 1 tablet by mouth two times a day  #60 x 11   Entered and Authorized by:   Julaine Fusi  DO   Signed by:   Julaine Fusi  DO on 12/03/2007   Method used:   Electronically sent to ...       [address]       [address]       [place], [place]  [address]       Ph: [phone]       Fax: [phone]   RxID:   [id number] LISINOPRIL 40 MG TABS (LISINOPRIL) Take 1 tablet by mouth once a day  #30 x 11   Entered and Authorized by:   Julaine Fusi  DO   Signed by:   Julaine Fusi  DO on 12/03/2007   Method used:   Electronically sent to ...       [address]       [address]       [place], [place]  [address]       Ph: [phone]       Fax: [phone]   RxID:   [id number] HYDROCHLOROTHIAZIDE 25 MG TABS (HYDROCHLOROTHIAZIDE) Take 1 tablet by mouth once a day  #30 x 11   Entered and Authorized by:   Julaine Fusi  DO   Signed by:   Julaine Fusi  DO on 12/03/2007   Method used:   Electronically sent to ...       [address]       [address]       [place], [place]  [address]       Ph: [phone]       Fax: [phone]   RxID:   [id number]

## 2010-09-05 NOTE — Miscellaneous (Signed)
Summary: Medical Surgical Procedures  Medical Surgical Procedures   Imported By: Florinda Marker 03/18/2008 16:01:37  _____________________________________________________________________  External Attachment:    Type:   Image     Comment:   External Document

## 2010-09-05 NOTE — Assessment & Plan Note (Signed)
Summary: EST-CK/FUMEDS/CFB   Vital Signs:  Patient Profile:   70 Years Old Female Height:     58.5 inches (148.59 cm) Weight:      120.5 pounds (54.77 kg) Temp:     97.9 degrees F (36.61 degrees C) oral Pulse rate:   103 / minute BP sitting:   158 / 80  (right arm)  Pt. in pain?   yes    Location:   generalized    Intensity:   4    Type:       aching  Vitals Entered By: Krystal Eaton Duncan Dull) (May 16, 2007 1:45 PM)              [name]  Does patient need assistance? Functional Status Self care Ambulation Normal     PCP:  [place]  Chief Complaint:  "little aches and pains" requests flu vaccine, urinary frequency and burning, and and med refill on clonazepam.  History of Present Illness: Ms. Boback is a 70 year old woman with osteoporosis, hypertension, and presumed osteoarthritis who presents to teh office today for routine follow-up. Several months ago she recieved reclast injections for her Oseoporosis and had a serious reaction including profuse vomiting, flu-like symptoms, and diarrhea. She is since much better symptomatically but we are closely monitoring her electrolytes including her mag, phos, calcium and well as her renal and liver function.   Today she complains of generalized pain in her joints that responds to tylenol, right knee swelling and pain with ambulation. She has been regularly walking for exercise. She also reports that her finger tips get a dark blue black color, often after exercise or in cold weather. She denies any rashes,no dysphagia but reports GERD with a "hital hernia", no breathing problems, but continue to smoke.       Note: [phone] son's phone number-can leave message for patient.  Current Allergies (reviewed today): ! NIACIN (NIACIN) ! RECLAST (ZOLEDRONIC ACID)    Risk Factors: Tobacco use:  quit    Year quit:  2007  Mammogram History:    Date of Last Mammogram:  08/27/2006    Physical  Exam  General:     alert, well-nourished, and well-hydrated.  Anxious appearing Lungs:     normal respiratory effort, normal breath sounds, no dullness, no crackles, and no wheezes.   Heart:     normal rate, regular rhythm, no murmur, no gallop, and no rub.   Abdomen:     soft, non-tender, and normal bowel sounds.   Msk:     Knees with djd deformity. No ligament laxicity. No palpable masses in posterior compartment. No calf tenmderness. Crepitus on ROM bilaterally , joint line tenderness on right knee. Noted talar inversion deformity as well on the right. No foot drop or neurological deficits. Pulses:     2+ in LE bilaterally, 2+ radial pulses, Adson's test normal. Extremities:     no discoloation, trace edema, varicosities Neurologic:     non focal exam, motor and sensation intact. Skin:     no rashes Psych:     appropriate    Impression & Recommendations:  Problem # 1:  RAYNAUD'S SYNDROME (ICD-443.0) Will start a preliminary work up for autoimmune disease, specifically scleroderma and RA since she has significant joint deforemities in an RA distribution. Given the potentially large differential- will work up stepwise. I also strongly advised that she stop smoking.  Orders: T-Rheumatoid Factor [id number]) T-Sed Rate (Automated) [id number]) T-Antinuclear Antib (ANA) [id number]) T- * Misc.  Laboratory test [id number])   Problem # 2:  OSTEOPOROSIS (ICD-733.00) Patient recieved Reclast. Will address further bisphosphonate therapy in 1 year. For now she should continue with Vitamin D supplements, daily walking, and smoking cessation.   The following medications were removed from the medication list:    Reclast 5 Mg/178ml Soln (Zoledronic acid) .Marland Kitchen... To be give iv at [facility] short stay on 01/27/07  Her updated medication list for this problem includes:    Oscal 500/200 D-3 Tabs (Calcium-vitamin d tabs) .Marland Kitchen... Three times a day  Orders: Diagnostic X-Ray/Fluoroscopy (Diagnostic  X-Ray/Flu)   Problem # 3:  DYSURIA (ICD-788.1) She complains of increased frequency and burning on urination. No fevers or systemic signs of infection. Will follow. If negative will complete GYN exam at next visit.  Orders: T-Culture, Urine ([id number]) T-Urinalysis ([id number])   Problem # 4:  HYPOMAGNESEMIA (ICD-275.2) Will check electrolytes today. Hold by mouth mag replacement for now. Orders: T-Comprehensive Metabolic Panel [id number]) T-Magnesium [id number]) T-Phosphorus [id number]) T-CBC w/Diff ([id number])   Problem # 5:  KNEE PAIN (ICD-719.46) Right knee pain with swelling and joint deformity. Will obtain plain films. No evidence of an acute joint infection. Knee looks chronic- probably a small effusion present.  Recommened Aleve twice daily. She has completed PT in teh past with some relief so if pain continues we will refer her at next visit.  Her updated medication list for this problem includes:    Aspirin 81 Mg Tabs (Aspirin) .Marland Kitchen... Take 1 tablet by mouth once a day   Complete Medication List: 1)  Lisinopril 40 Mg Tabs (Lisinopril) .... Take 1 tablet by mouth once a day 2)  Hydrochlorothiazide 25 Mg Tabs (Hydrochlorothiazide) .... Take 1 tablet by mouth once a day 3)  Pepcid Complete Chew (Famotidine-ca carb-mag hydrox chew) .... Take 1 tablet by mouth once a day 4)  Clonidine Hcl 0.1 Mg Tabs (Clonidine hcl) .... Take 1 tablet by mouth two times a day 5)  Clonazepam 0.5 Mg Tabs (Clonazepam) .... Take 1 to 1/2 tablet twice a day as needed for anxiety 6)  Aspirin 81 Mg Tabs (Aspirin) .... Take 1 tablet by mouth once a day 7)  Oscal 500/200 D-3 Tabs (Calcium-vitamin d tabs) .... Three times a day 8)  Pravastatin Sodium 40 Mg Tabs (Pravastatin sodium) .... Take 1 tablet by mouth once a day 9)  Magnesium Oxide 400 Mg Caps (Magnesium oxide) .... Take 1 tablet by mouth two times a day     Prescriptions: CLONAZEPAM 0.5 MG  TABS (CLONAZEPAM) Take 1 to 1/2  tablet twice a day as needed for anxiety  #30 x 4   Entered and Authorized by:   Julaine Fusi  DO   Signed by:   Julaine Fusi  DO on 05/16/2007   Method used:   Print then Give to Patient   RxID:   [id number]  ]

## 2010-09-05 NOTE — Progress Notes (Signed)
Summary: refill/ hla  Phone Note Refill Request Message from:  Fax from Pharmacy on July 05, 2009 5:20 PM  Refills Requested: Medication #1:  CLONAZEPAM 0.5 MG  TABS Take 1 to 1/2 tablet twice a day as needed for anxiety   Last Refilled: 10/30 Initial call taken by: Marin Roberts RN,  July 05, 2009 5:20 PM  Follow-up for Phone Call        Refill approved-nurse to complete. Follow-up by: Margarito Liner MD,  July 05, 2009 5:27 PM  Additional Follow-up for Phone Call Additional follow up Details #1::        Rx called to pharmacy Additional Follow-up by: Marin Roberts RN,  July 06, 2009 4:31 PM    Prescriptions: CLONAZEPAM 0.5 MG  TABS (CLONAZEPAM) Take 1 to 1/2 tablet twice a day as needed for anxiety  #30 x 1   Entered and Authorized by:   Margarito Liner MD   Signed by:   Margarito Liner MD on 07/05/2009   Method used:   Telephoned to ...       [facility]  [address] [id number] (retail)       [address]       [place], [place]  [address]       Ph: [phone]       Fax: [phone]   RxID:   [id number]

## 2010-09-05 NOTE — Assessment & Plan Note (Signed)
Summary: REGULAR CHECK UP/CFB   Vital Signs:  Patient Profile:   70 Years Old Female Height:     58.5 inches (148.59 cm) Weight:      123.03 pounds (55.92 kg) BMI:     25.37 Temp:     97 degrees F (36.11 degrees C) oral Pulse rate:   75 / minute BP sitting:   151 / 70  (right arm)  Pt. in pain?   no  Vitals Entered By: Angelina Ok RN (February 20, 2008 1:36 PM)              [name] Nutritional Status BMI of 25 - 29 = overweight  Have you ever been in a relationship where you felt threatened, hurt or afraid?No   Does patient need assistance? Functional Status Self care Ambulation Normal     PCP:  [place]  Chief Complaint:  StoppedCholesterol med 2 months ago Pravastatin and numbness and blurred vision about 3 weeks ago son said that her face looked funny.  History of Present Illness: Ms.Negash comes into day for routine follow up. She complains of generalized fatigue. She recently stopped her statin after reading the side effects on her bottle. After stopping the statin she had improvement in her joint pain and lower extremity swelling. Most concerning today is that she complains of an episode 2-3 weeks ago where she woke up on her couch with a numb arm, facial droop, and vision loss. This episode lasted for 2 hours and then went away completly. She did not come to the hospital for evaluation. She also awoke with her tight fitting dentures on the floor and does not know how they got there. No other physical signs or symptoms. No injuries.    Updated Prior Medication List: LISINOPRIL 40 MG TABS (LISINOPRIL) Take 1 tablet by mouth once a day HYDROCHLOROTHIAZIDE 25 MG TABS (HYDROCHLOROTHIAZIDE) Take 1 tablet by mouth once a day PEPCID COMPLETE  CHEW (FAMOTIDINE-CA CARB-MAG HYDROX CHEW) Take 1 tablet by mouth once a day CLONIDINE HCL 0.1 MG TABS (CLONIDINE HCL) Take 1 tablet by mouth two times a day CLONAZEPAM 0.5 MG  TABS (CLONAZEPAM) Take 1 to 1/2 tablet  twice a day as needed for anxiety ASPIRIN 81 MG TABS (ASPIRIN) Take 1 tablet by mouth once a day OSCAL 500/200 D-3  TABS (CALCIUM-VITAMIN D TABS) three times a day PRAVASTATIN SODIUM 40 MG TABS (PRAVASTATIN SODIUM) Take 1 tablet by mouth once a day MAGNESIUM OXIDE 400 MG  CAPS (MAGNESIUM OXIDE) Take 1 tablet by mouth two times a day  Current Allergies: ! NIACIN (NIACIN) ! RECLAST (ZOLEDRONIC ACID)  Past Medical History:    Reviewed history from 09/10/2006 and no changes required:       Anxiety       GERD       Hyperlipidemia 224/245/42/133 4/07, repeat this visit       Hypertension       Osteoporosis - pt on Oscal + Vit D, repeat Bone scan next visit, if still osteoporotic start Fosamax if tolerable.       Angioedema - Sec to Niacin       Venous insufficiency       Hyponatremia - around 133       Tobacco abuse       Melanoma, mole in back       DEXA scan -osteoporosis on f/u DEXA in 1/ 2008    Risk Factors: Tobacco use:  quit    Year quit:  2007  Mammogram History:    Date of Last Mammogram:  08/27/2006   Review of Systems       The patient complains of syncope, peripheral edema, and gas/bloating.  The patient denies fever, chills, anorexia, fatigue, chest pain, palpitations, and dyspnea on exertion.         Some generalized edema per the patient- in her abdomen- non-pitting.   Physical Exam  General:     No distress Neck:     no adenopathy Chest Wall:     non-tender Lungs:     CTAB Heart:     RRR no murmurs Abdomen:     No edema or tenderness, active bowel sounds Msk:     intact-moves all 4 extremities.  Pulses:     2+ dp bilateral Extremities:     no edema Neurologic:     alert & oriented X3, cranial nerves II-XII intact, strength normal in all extremities, sensation intact to light touch, gait normal, DTRs symmetrical and normal, and finger-to-nose normal.      Impression & Recommendations:  Problem # 1:  TRANSIENT ISCHEMIC ATTACK  (ICD-435.9) Will need to rule out TIA vs. stroke with MRI. Event was 3 weeks ago- no other similar events.  Will start her ASA today. Will need to check labs including CMET and CBC and Lipids-will check monday fasting. Since she has a completely normal nero exam i dont think this is urgent- but we will need to work up completely. If negative will need to broaden differential for possible syncopal episode/or other neurological event such as seizure.    Await MRI/MRA , lipids--start ASA for now- hold statin(patient thinks her swelling is related to her taking a statin) -will make a decision regarding a statin- no solid evidence that the statin was causing her edema- except that she stopped the medication and the swelling improved.  Her updated medication list for this problem includes:    Aspirin 81 Mg Tabs (Aspirin) .Marland Kitchen... Take 1 tablet by mouth once a day  Orders: MRI with Contrast (MRI w/Contrast) T-Comprehensive Metabolic Panel ([id number])  Future Orders: T-Lipid Profile ([id number]) ... 02/23/2008   Problem # 2:  ANEMIA, MILD (ICD-285.9) Repeat CBC- can check anemia panel next visit. Colonoscopy done in last 5 years per patient and was normal.  Problem # 3:  HEART MURMUR, HX OF (ICD-V12.50) Will check 2D Echo. Probably functional murmur but with her question of syncope will go ahead and get test. Orders: 2 D Echo (2 D Echo)   Problem # 4:  HYPONATREMIA (ICD-276.1) Will repeat BMET- If still present, will need complete work up- have attributed this to a bad reaction to Reclast infusion almost a year ago- but should have resolved by now.  Complete Medication List: 1)  Lisinopril 40 Mg Tabs (Lisinopril) .... Take 1 tablet by mouth once a day 2)  Hydrochlorothiazide 25 Mg Tabs (Hydrochlorothiazide) .... Take 1 tablet by mouth once a day 3)  Pepcid Complete Chew (Famotidine-ca carb-mag hydrox chew) .... Take 1 tablet by mouth once a day 4)  Clonidine Hcl 0.1 Mg Tabs (Clonidine hcl)  .... Take 1 tablet by mouth two times a day 5)  Clonazepam 0.5 Mg Tabs (Clonazepam) .... Take 1 to 1/2 tablet twice a day as needed for anxiety 6)  Aspirin 81 Mg Tabs (Aspirin) .... Take 1 tablet by mouth once a day 7)  Oscal 500/200 D-3 Tabs (Calcium-vitamin d tabs) .... Three times a day 8)  Pravastatin Sodium 40 Mg Tabs (Pravastatin  sodium) .... Take 1 tablet by mouth once a day 9)  Magnesium Oxide 400 Mg Caps (Magnesium oxide) .... Take 1 tablet by mouth two times a day   Patient Instructions: 1)  Please schedule a follow-up appointment in 1 month- [facility]- may add on. 2)    3)  Take an Aspirin every day.   ]

## 2010-09-07 NOTE — Assessment & Plan Note (Signed)
Summary: MONTHLY B12SHOT/CH  Nurse Visit   Allergies: 1)  ! Niacin (Niacin) 2)  ! Reclast (Zoledronic Acid) 3)  ! * Statin 4)  Adhesive Bandages Plastic (Adhesive Bandages)  Medication Administration  Injection # 1:    Medication: Vit B12 1000 mcg    Diagnosis: ANEMIA, VITAMIN B12 DEFICIENCY (ICD-281.1)    Route: IM    Site: R deltoid    Exp Date: 02/2012    Lot #: [id number]    Mfr: [employer]    Patient tolerated injection without complications    Given by: Chinita Pester RN (July 27, 2010 10:50 AM)  Orders Added: 1)  Admin of Therapeutic Inj  intramuscular or subcutaneous [96372] 2)  Vit B12 1000 mcg [J3420]

## 2010-09-07 NOTE — Assessment & Plan Note (Addendum)
Summary: MONTHLY B12 INJ/CH  Nurse Visit   Allergies: 1)  ! Niacin (Niacin) 2)  ! Reclast (Zoledronic Acid) 3)  ! * Statin 4)  Adhesive Bandages Plastic (Adhesive Bandages)  Medication Administration  Injection # 1:    Medication: Vit B12 1000 mcg    Diagnosis: ANEMIA, VITAMIN B12 DEFICIENCY (ICD-281.1)    Route: IM    Site: R deltoid    Exp Date: 02/2012    Lot #: [id number]    Mfr: [employer]    Patient tolerated injection without complications    Given by: Angelina Ok RN (August 28, 2010 10:32 AM)  Orders Added: 1)  Admin of Therapeutic Inj  intramuscular or subcutaneous [96372] 2)  Vit B12 1000 mcg [J3420]   Medication Administration  Injection # 1:    Medication: Vit B12 1000 mcg    Diagnosis: ANEMIA, VITAMIN B12 DEFICIENCY (ICD-281.1)    Route: IM    Site: R deltoid    Exp Date: 02/2012    Lot #: [id number]    Mfr: [employer]    Patient tolerated injection without complications    Given by: Angelina Ok RN (August 28, 2010 10:32 AM)  Orders Added: 1)  Admin of Therapeutic Inj  intramuscular or subcutaneous [96372] 2)  Vit B12 1000 mcg [J3420]

## 2010-09-14 ENCOUNTER — Encounter: Payer: Self-pay | Admitting: Internal Medicine

## 2010-09-14 ENCOUNTER — Ambulatory Visit (INDEPENDENT_AMBULATORY_CARE_PROVIDER_SITE_OTHER): Payer: Medicare Other | Admitting: Internal Medicine

## 2010-09-14 VITALS — BP 148/71 | HR 74 | Temp 97.8°F | Ht 59.0 in | Wt 125.8 lb

## 2010-09-14 DIAGNOSIS — F419 Anxiety disorder, unspecified: Secondary | ICD-10-CM

## 2010-09-14 DIAGNOSIS — M810 Age-related osteoporosis without current pathological fracture: Secondary | ICD-10-CM

## 2010-09-14 DIAGNOSIS — F411 Generalized anxiety disorder: Secondary | ICD-10-CM

## 2010-09-14 DIAGNOSIS — R5381 Other malaise: Secondary | ICD-10-CM

## 2010-09-14 DIAGNOSIS — R5383 Other fatigue: Secondary | ICD-10-CM

## 2010-09-14 DIAGNOSIS — I7300 Raynaud's syndrome without gangrene: Secondary | ICD-10-CM

## 2010-09-14 DIAGNOSIS — I10 Essential (primary) hypertension: Secondary | ICD-10-CM

## 2010-09-14 DIAGNOSIS — E785 Hyperlipidemia, unspecified: Secondary | ICD-10-CM

## 2010-09-14 DIAGNOSIS — K219 Gastro-esophageal reflux disease without esophagitis: Secondary | ICD-10-CM

## 2010-09-14 DIAGNOSIS — D518 Other vitamin B12 deficiency anemias: Secondary | ICD-10-CM

## 2010-09-14 LAB — COMPREHENSIVE METABOLIC PANEL
ALT: 13 U/L (ref 0–35)
AST: 16 U/L (ref 0–37)
Albumin: 4.6 g/dL (ref 3.5–5.2)
Alkaline Phosphatase: 100 U/L (ref 39–117)
BUN: 27 mg/dL — ABNORMAL HIGH (ref 6–23)
CO2: 23 mEq/L (ref 19–32)
Calcium: 10.4 mg/dL (ref 8.4–10.5)
Chloride: 99 mEq/L (ref 96–112)
Creat: 0.98 mg/dL (ref 0.40–1.20)
Glucose, Bld: 93 mg/dL (ref 70–99)
Potassium: 5 mEq/L (ref 3.5–5.3)
Sodium: 134 mEq/L — ABNORMAL LOW (ref 135–145)
Total Bilirubin: 0.5 mg/dL (ref 0.3–1.2)
Total Protein: 7.4 g/dL (ref 6.0–8.3)

## 2010-09-14 MED ORDER — PAROXETINE HCL 10 MG PO TABS: 10.0000 mg | ORAL_TABLET | Freq: Every day | ORAL | Status: DC

## 2010-09-14 NOTE — Patient Instructions (Signed)
Start new medication for anxiety.

## 2010-09-15 LAB — CBC WITH DIFFERENTIAL/PLATELET
Basophils Absolute: 0 10*3/uL (ref 0.0–0.1)
Basophils Relative: 0 % (ref 0–1)
Eosinophils Absolute: 0.1 10*3/uL (ref 0.0–0.7)
Eosinophils Relative: 1 % (ref 0–5)
HCT: 34.6 % — ABNORMAL LOW (ref 36.0–46.0)
Hemoglobin: 11.6 g/dL — ABNORMAL LOW (ref 12.0–15.0)
Lymphocytes Relative: 30 % (ref 12–46)
Lymphs Abs: 2.1 10*3/uL (ref 0.7–4.0)
MCH: 29.9 pg (ref 26.0–34.0)
MCHC: 33.5 g/dL (ref 30.0–36.0)
MCV: 89.2 fL (ref 78.0–100.0)
Monocytes Absolute: 0.5 10*3/uL (ref 0.1–1.0)
Monocytes Relative: 7 % (ref 3–12)
Neutro Abs: 4.3 10*3/uL (ref 1.7–7.7)
Neutrophils Relative %: 62 % (ref 43–77)
Platelets: 377 10*3/uL (ref 150–400)
RBC: 3.88 MIL/uL (ref 3.87–5.11)
RDW: 13.4 % (ref 11.5–15.5)
WBC: 7 10*3/uL (ref 4.0–10.5)

## 2010-09-20 ENCOUNTER — Encounter: Payer: Self-pay | Admitting: Internal Medicine

## 2010-09-20 ENCOUNTER — Telehealth: Payer: Self-pay | Admitting: *Deleted

## 2010-09-20 ENCOUNTER — Ambulatory Visit (INDEPENDENT_AMBULATORY_CARE_PROVIDER_SITE_OTHER): Payer: Medicare Other | Admitting: Internal Medicine

## 2010-09-20 DIAGNOSIS — T783XXA Angioneurotic edema, initial encounter: Secondary | ICD-10-CM

## 2010-09-20 DIAGNOSIS — I10 Essential (primary) hypertension: Secondary | ICD-10-CM

## 2010-09-20 DIAGNOSIS — F411 Generalized anxiety disorder: Secondary | ICD-10-CM

## 2010-09-20 NOTE — Assessment & Plan Note (Signed)
BP today is 173/81, which is moderately elevated SBP. Will reassess during next visit and not change anything for now.

## 2010-09-20 NOTE — Progress Notes (Signed)
  Subjective:    Patient ID: Elizabeth Woodward, female    DOB: 28-Sep-1940, 70 y.o.   MRN: [id number]  HPI   Elizabeth Woodward comes in today for routine followup.  Her new complaint today of her worsening fatigue.  B12 has not been helpful.  She has been on Klonopin twice daily.  She has never tried an SSRI for depression. Reports increased stressful situation at home. She does have some dyspnea on mild on exertion. she attributes this to lack of activity and deconditioning.  She's had no chest pain, fevers, night sweats or chills.  No significant weight loss.  She needs medication refills.   Review of Systems     Per history of present illness. Objective:   Physical Exam  Nursing note and vitals reviewed. Constitutional: She is oriented to person, place, and time. She appears well-developed and well-nourished. No distress.  HENT:  Head: Normocephalic and atraumatic.  Cardiovascular: Normal rate, regular rhythm and normal heart sounds.   Pulmonary/Chest: Effort normal and breath sounds normal.  Abdominal: Soft. Bowel sounds are normal. She exhibits no distension. There is no tenderness.  Musculoskeletal: She exhibits no edema and no tenderness.  Neurological: She is alert and oriented to person, place, and time.  Skin: Skin is warm and dry. No rash noted. No erythema.  Psychiatric: She has a normal mood and affect. Her behavior is normal.          Assessment & Plan:  See Problem-oriented charting

## 2010-09-20 NOTE — Assessment & Plan Note (Signed)
Considering the mild angioedema like reaction to Paxil , will discontinue Paxil and will give Benadryl 25 mg po TID from [facility]. Will expect the reaction to settle down with this or else explained pt to call back.

## 2010-09-20 NOTE — Assessment & Plan Note (Signed)
Will stop her Paxil considering a possible reaction to it- mild Angioedema. Also will increase the dose of Clonazepam to 0.5 mg daily- which was the dose prior to last visit.

## 2010-09-20 NOTE — Progress Notes (Signed)
  Subjective:    Patient ID: Wynona Luna, female    DOB: 1941-02-26, 70 y.o.   MRN: [id number]  HPI Pt is a 70 yo woman with PMH of allergic reactions, anxiety, HTN, HLD, who came to the clinic with CC of sweeling of her face and lower lip. She had a last visit at [facility] on 09/14/10, when she was started on Paxil, which was the only new significant change recently. She had no change in diet, no known insect bite or anything else unusual. She also complaints of tingling on R side of her face, lateral to the nose. Review of Systems  Constitutional: Positive for diaphoresis. Negative for fever.  HENT: Negative for hearing loss, ear pain, sneezing, neck pain, neck stiffness and tinnitus.   Eyes: Negative for photophobia, pain, discharge, redness and itching.  Respiratory: Negative.   Cardiovascular: Negative.   Gastrointestinal: Negative.   Genitourinary: Negative.   Neurological: Negative for dizziness and headaches.       Review of Systems  Constitutional: Positive for diaphoresis. Negative for fever.  HENT: Negative for hearing loss, ear pain, sneezing, neck pain, neck stiffness and tinnitus.   Eyes: Negative for photophobia, pain, discharge, redness and itching.  Respiratory: Negative.   Cardiovascular: Negative.   Gastrointestinal: Negative.   Genitourinary: Negative.   Neurological: Negative for dizziness and headaches.       Objective:   Physical Exam  Constitutional: She is oriented to person, place, and time. She appears well-developed and well-nourished. She appears distressed.  HENT:  Head: Normocephalic and atraumatic.  Mouth/Throat: Oropharynx is clear and moist. No oropharyngeal exudate.  Eyes: Conjunctivae and EOM are normal. Pupils are equal, round, and reactive to light. Right eye exhibits no discharge.  Neck: Normal range of motion. Neck supple. No tracheal deviation present. No thyromegaly present.  Cardiovascular: Normal rate, regular rhythm and normal heart  sounds.  Exam reveals no gallop and no friction rub.   No murmur heard. Pulmonary/Chest: Effort normal and breath sounds normal. No stridor. No respiratory distress. She has no wheezes.  Abdominal: Soft. Bowel sounds are normal. She exhibits no distension.  Musculoskeletal: Normal range of motion.  Neurological: She is alert and oriented to person, place, and time. No cranial nerve deficit.  Skin: Skin is warm. No rash noted.          Assessment & Plan:

## 2010-09-20 NOTE — Assessment & Plan Note (Signed)
Blood pressure is stable. I've been following her be met related to her abnormal calcium level and history of mild renal insufficiency.  The patient will strongly that she wants to continue her HCTZ medication I suspect this is the source of her mild hyponatremia and hypercalcemia both are very mild and very borderline so degree to continue this medication for her blood pressure. The patient perceives that this is helpful with her lower extremity edema which is likely from venous insufficiency. A 2-D echo done last year did not reveal any significant signs of cardiac dysfunction.

## 2010-09-20 NOTE — Assessment & Plan Note (Signed)
Anxiety with probable component of depression. C/O fatigue, lack of motivation, sad thoughts, no SI or psychosis. Patient is on Klonopin twice a day. I have recommended that she cut this back to once daily at night and start on Zoloft as an SSRI and hopefully this will improve her energy levels.

## 2010-09-20 NOTE — Patient Instructions (Signed)
Please stop taking PAXIL. Considering an allergy to it , you will not take it again from now. Also go back to your Klonopin dose to 0.5 mg at bedtime. Take the Benadryl as instructed- now, at 10pm today and in morning with breakfast. Please call if your swelling gets worse. If you have any choking of your breath or swelling of your tongue, please go to the ED.

## 2010-09-20 NOTE — Assessment & Plan Note (Signed)
Patient symptoms are well-controlled Pepcid. Given her a prescription today. Continues to use fiber for GI health.

## 2010-09-20 NOTE — Telephone Encounter (Signed)
Pt called and lm stating she thought she was having a reaction  To a medication, when called back she stated she was at the hospital and on her way to [place], she is added to dr patel's schedule and he along w/ dr Meredith Pel and dr patel's nurse are made aware

## 2010-09-20 NOTE — Assessment & Plan Note (Signed)
Patient has a history of osteoporosis. She has been on high dose calcium and vitamin D supplementation. No history of fractures. She had a serious adverse response to IV bisphosphonates.  He will need to repeat a bone density study in the next 6 months.

## 2010-09-20 NOTE — Assessment & Plan Note (Signed)
Will check a CBC today in the setting of fatigue and mild SOB.

## 2010-09-20 NOTE — Assessment & Plan Note (Signed)
She is statin intolerant, but I have been following her levels. She is taking Fish Oil only and doing dietary modifications.

## 2010-09-27 ENCOUNTER — Encounter: Payer: Self-pay | Admitting: Internal Medicine

## 2010-09-28 ENCOUNTER — Ambulatory Visit (INDEPENDENT_AMBULATORY_CARE_PROVIDER_SITE_OTHER): Payer: Medicare Other | Admitting: Internal Medicine

## 2010-09-28 DIAGNOSIS — D518 Other vitamin B12 deficiency anemias: Secondary | ICD-10-CM

## 2010-09-28 MED ORDER — CYANOCOBALAMIN 1000 MCG/ML IJ SOLN
1000.0000 ug | Freq: Once | INTRAMUSCULAR | Status: AC
  Administered 2010-09-28: 1000 ug via INTRAMUSCULAR

## 2010-09-28 NOTE — Progress Notes (Signed)
  Subjective:    Patient ID: Elizabeth Woodward, female    DOB: November 30, 1940, 70 y.o.   MRN: [id number]  HPI    Review of Systems     Objective:   Physical Exam        Assessment & Plan:

## 2010-10-23 ENCOUNTER — Encounter: Payer: Self-pay | Admitting: Internal Medicine

## 2010-10-23 ENCOUNTER — Ambulatory Visit (INDEPENDENT_AMBULATORY_CARE_PROVIDER_SITE_OTHER): Payer: Medicare Other | Admitting: Internal Medicine

## 2010-10-23 VITALS — BP 138/30 | HR 84 | Temp 98.4°F | Ht 64.0 in | Wt 124.0 lb

## 2010-10-23 DIAGNOSIS — I10 Essential (primary) hypertension: Secondary | ICD-10-CM

## 2010-10-23 DIAGNOSIS — F411 Generalized anxiety disorder: Secondary | ICD-10-CM

## 2010-10-23 DIAGNOSIS — D518 Other vitamin B12 deficiency anemias: Secondary | ICD-10-CM

## 2010-10-23 DIAGNOSIS — T783XXA Angioneurotic edema, initial encounter: Secondary | ICD-10-CM

## 2010-10-23 MED ORDER — LISINOPRIL 40 MG PO TABS: 40.0000 mg | ORAL_TABLET | Freq: Every day | ORAL | Status: DC

## 2010-10-23 MED ORDER — CLONAZEPAM 0.5 MG PO TABS
0.5000 mg | ORAL_TABLET | Freq: Every day | ORAL | Status: DC
Start: 2010-10-23 — End: 2011-03-29

## 2010-10-23 MED ORDER — CYANOCOBALAMIN 1000 MCG/ML IJ SOLN
1000.0000 ug | Freq: Once | INTRAMUSCULAR | Status: AC
  Administered 2010-10-23: 1000 ug via INTRAMUSCULAR

## 2010-10-23 MED ORDER — HYDROCHLOROTHIAZIDE 25 MG PO TABS: 25.0000 mg | ORAL_TABLET | Freq: Every day | ORAL | Status: DC

## 2010-10-23 NOTE — Assessment & Plan Note (Addendum)
Resolved. No further therapy needed at this time.

## 2010-10-23 NOTE — Assessment & Plan Note (Signed)
Well controlled. Continue current regimen. Renal function and electrolytes normal per recent CMET (09/2010). - Refill of HCTZ, Lisinopril today

## 2010-10-23 NOTE — Assessment & Plan Note (Signed)
Administer today 

## 2010-10-23 NOTE — Patient Instructions (Signed)
1) Please follow-up at the clinic in 1 month for B12 shot, then in 2-3 months for follow-up, at which time we will reevaluate your blood pressure, anxiety. 2) You have been started on new medication(s), if you develop throat closing, tongue swelling, rash, please stop the medication and call the clinic at [phone] and go to the ER. 3) Please bring all of your medications in a bag to your next visit. 4) If you are diabetic, please bring your meter to your next visit. 5) If symptoms worsen, or new symptoms arise, please call the clinic or go to the ER.

## 2010-10-23 NOTE — Assessment & Plan Note (Signed)
Well controlled. Continue current regimen. - Refill Klonopin today.

## 2010-10-23 NOTE — Progress Notes (Signed)
Subjective:    Patient ID: Elizabeth Woodward, female    DOB: [birth year], 70 y.o.   MRN: [id number]  HPI Patient is a 70 year old female with past medical history of hyperlipidemia, hypertension, anxiety the , recent allergic angioedema secondary to Paxil use presents to the clinic for the following:  1) F/U of angioedema - pt was seen in clinic on 09/20/10 for evaluation of facial and lip swelling with blistering of lips, determined to be secondary to mild angioedema, possibly caused by Paxil. Since discontinuing the medication, swelling resolved after 3 days.   2) HTN - Patient does check blood pressure regularly at home, with average BP readings of 130s/70s mmHg. Currently taking Coreg 12.5, Lisinopril 40mg , and HCTZ 25mg . Denies headaches, dizziness, lightheadedness, chest pain, shortness of breath. Refills requested.   3) Anxiety - Pt now taking Klonopin 0.5 mg qHS, no SI/HI. Anxiety attacks still almost daily, but not effecting daily living, as patient still able to function well, is shopping, taking care of herself.    Review of Systems Per HPI.  Current Outpatient Medications Medication Sig  . aspirin 81 MG tablet Take 81 mg by mouth daily.    . calcium-vitamin D (OSCAL WITH D 500-200) 500-200 MG-UNIT per tablet Take 1 tablet by mouth daily.    . carvedilol (COREG) 12.5 MG tablet Take 12.5 mg by mouth 2 (two) times daily.    . Cholecalciferol (VITAMIN D-3 PO) Take 2 tablets by mouth daily.   . clonazePAM (KLONOPIN) 0.5 MG tablet Take 0.5 mg by mouth at bedtime. For anxiety.  . cyanocobalamin 1000 MCG/ML injection Inject 1,000 mcg into the muscle every 30 (thirty) days.    . famotidine (PEPCID) 20 MG tablet Take 20 mg by mouth 2 (two) times daily. Take in AM and PM   . hydrochlorothiazide 25 MG tablet Take 25 mg by mouth daily.    Marland Kitchen lisinopril (PRINIVIL,ZESTRIL) 40 MG tablet Take 40 mg by mouth daily.    . Multiple Vitamin (MULTIVITAMIN) tablet Take 1 tablet by mouth daily.    .  Omega 3-6-9 Fatty Acids (OMEGA-3 & OMEGA-6 FISH OIL PO) Take 1 tablet by mouth daily.    . Wheat Dextrin (BENEFIBER) TABS Take 1 tablet by mouth 2 (two) times daily. Or as directed.    Allergies Niacin; Statins; Zoledronic acid; and Paxil  Past Medical History  Diagnosis Date  . History of melanoma      status post excision of lesion 2006, dermatology  re-eva 2011  . Intravenous bisphosphonates causing adverse effect in therapeutic use     Glu-like illness and electrolyte disturbance  . Anxiety   . Hyperlipidemia      statin intolerance severe, myopathy  . Hypertension   . Angioedema     secondary to niacin  . Hypercalcemia     possibloe association with HCTZ but patient wanbts to continue diuretic  . Hypomagnesemia     secondary to bisphosphonate IV  . Raynaud's phenomenon     negative ANA, ESR  . Osteoporosis   . GERD (gastroesophageal reflux disease)     Past Surgical History  Procedure Date  . Abdominal hysterectomy 70 yo    partial hysterectomy - unclear cause, pt thinks possible fibroids.      Objective:   Physical Exam General: Vital signs reviewed and noted. Well-developed,well-nourished,in no acute distress; alert,appropriate and cooperative throughout examination. Head: normocephalic, atraumatic. Neck: No deformities, masses, or tenderness noted. Lungs: Normal respiratory effort. Clear to auscultation BL without crackles or wheezes.  Heart: RRR. S1 and S2 normal without gallop, murmur, or rubs.  Abdomen: BS normoactive. Soft, Nondistended, non-tender.  No masses or organomegaly. Extremities: No pretibial edema.     Assessment & Plan:  Case and plan of care discussed with Dr. Ulyess Mort.

## 2010-10-27 ENCOUNTER — Ambulatory Visit: Payer: Medicare Other

## 2010-11-02 ENCOUNTER — Other Ambulatory Visit: Payer: Self-pay | Admitting: Internal Medicine

## 2010-11-27 ENCOUNTER — Ambulatory Visit (INDEPENDENT_AMBULATORY_CARE_PROVIDER_SITE_OTHER): Payer: Medicare Other | Admitting: *Deleted

## 2010-11-27 DIAGNOSIS — E538 Deficiency of other specified B group vitamins: Secondary | ICD-10-CM

## 2010-11-27 MED ORDER — CYANOCOBALAMIN 1000 MCG/ML IJ SOLN
1000.0000 ug | Freq: Once | INTRAMUSCULAR | Status: AC
  Administered 2010-11-27: 1000 ug via INTRAMUSCULAR

## 2010-12-22 NOTE — Op Note (Signed)
NAMEKATRECE, ROEDIGER                         ACCOUNT NO.:  [id number]   MEDICAL RECORD NO.:  [id number]                   PATIENT TYPE:  EMS   LOCATION:  [facility]                                 FACILITY:  [facility]   PHYSICIAN:  Danise Edge, M.D.                DATE OF BIRTH:  1941-07-14   DATE OF PROCEDURE:  11/10/2002  DATE OF DISCHARGE:                                 OPERATIVE REPORT   PROCEDURE:  Esophagogastroduodenoscopy and Savary dilatation.   INDICATIONS:  The patient is a 70 year old female born 09-10-1940.  The patient was evaluated in the [facility] emergency room on  November 10, 2002, when food obstructed her esophagus. Her esophageal  obstruction resolved spontaneously.   The patient tells me she underwent an esophageal dilatation performed by Dr.  Victorino Dike years ago and most recently underwent a colonoscopy performed by  Dr. Vida Rigger. She was told she needs a repeat colonoscopy in 2005.   I discussed with the patient the complications associated with esophageal  dilatation including intestinal bleeding and intestinal perforation. The  patient has signed the operative permit.   ENDOSCOPIST:  Danise Edge, M.D.   PREMEDICATION:  1. Demerol 50 mg.  2. Versed 5 mg.   ENDOSCOPE:  Olympus gastroscope. A 15-mm Savary dilator.   DESCRIPTION OF PROCEDURE:  After informed consent was obtained the patient  was placed in the left lateral decubitus position. I administered  intravenous Demerol and intravenous Versed to achieve conscious sedation for  the procedure. The patient's blood pressure, O2 saturation and cardiac  rhythm were monitored throughout the procedure and documented in the medical  record.   The Olympus gastroscope was passed through the posterior hypopharynx into  the proximal esophagus without difficulty. The hypopharynx, larynx and vocal  cords appeared normal.   Esophagoscopy: The proximal and mid segments of the esophagus  appeared  normal. There is mucosal scarring and mucosal stricture formation in the  distal esophagus not associated with esophageal tumors or Barrett's  esophagus.   Gastroscopy:  The patient has a moderate sized hiatal hernia. ?Retroflex  view of the gastric cardia and fundus was normal. The diaphragmatic hiatus  was quite patulous. The gastric body appeared normal. There are linear  erosion in the gastric antrum. The  pylorus is patent.   Duodenoscopy:  The duodenal bulb mid duodenum and distal duodenum appeared  normal.   Savary esophageal dilatation:  The Savary dilator wire was passed through  the endoscope and the tip of the guide wire was advanced to the distal  gastric antrum as confirmed endoscopically. The 15-mm Savary dilator passed  without resistance. Repeat esophagogastroscopy confirmed satisfactory  dilatation of the benign peptic stricture in the distal esophagus and no  gastric trauma due to the guide wire.  Danise Edge, M.D.    MJ/MEDQ  D:  11/10/2002  T:  11/11/2002  Job:  [id number]   cc:   Petra Kuba, M.D.  [address]  [place]  [place] [address]  Fax: [phone]

## 2010-12-27 ENCOUNTER — Ambulatory Visit (INDEPENDENT_AMBULATORY_CARE_PROVIDER_SITE_OTHER): Payer: Medicare Other | Admitting: *Deleted

## 2010-12-27 DIAGNOSIS — E538 Deficiency of other specified B group vitamins: Secondary | ICD-10-CM

## 2010-12-27 MED ORDER — CYANOCOBALAMIN 1000 MCG/ML IJ SOLN
1000.0000 ug | Freq: Once | INTRAMUSCULAR | Status: AC
  Administered 2010-12-27: 1000 ug via INTRAMUSCULAR

## 2011-01-12 ENCOUNTER — Other Ambulatory Visit: Payer: Self-pay | Admitting: Internal Medicine

## 2011-01-26 ENCOUNTER — Ambulatory Visit: Payer: Medicare Other

## 2011-01-31 ENCOUNTER — Ambulatory Visit (INDEPENDENT_AMBULATORY_CARE_PROVIDER_SITE_OTHER): Payer: Medicare Other | Admitting: *Deleted

## 2011-01-31 DIAGNOSIS — E539 Vitamin B deficiency, unspecified: Secondary | ICD-10-CM

## 2011-01-31 MED ORDER — CYANOCOBALAMIN 1000 MCG/ML IJ SOLN
1000.0000 ug | Freq: Once | INTRAMUSCULAR | Status: AC
  Administered 2011-01-31: 1000 ug via INTRAMUSCULAR

## 2011-02-01 ENCOUNTER — Ambulatory Visit: Payer: Medicare Other

## 2011-02-17 ENCOUNTER — Encounter: Payer: Self-pay | Admitting: Internal Medicine

## 2011-03-02 ENCOUNTER — Ambulatory Visit (INDEPENDENT_AMBULATORY_CARE_PROVIDER_SITE_OTHER): Payer: Medicare Other | Admitting: *Deleted

## 2011-03-02 DIAGNOSIS — D518 Other vitamin B12 deficiency anemias: Secondary | ICD-10-CM

## 2011-03-02 MED ORDER — CYANOCOBALAMIN 1000 MCG/ML IJ SOLN
1000.0000 ug | Freq: Once | INTRAMUSCULAR | Status: AC
  Administered 2011-03-02: 1000 ug via INTRAMUSCULAR

## 2011-03-29 ENCOUNTER — Encounter: Payer: Self-pay | Admitting: Internal Medicine

## 2011-03-29 ENCOUNTER — Ambulatory Visit (INDEPENDENT_AMBULATORY_CARE_PROVIDER_SITE_OTHER): Payer: Medicare Other | Admitting: Internal Medicine

## 2011-03-29 VITALS — BP 140/79 | HR 74 | Temp 98.8°F | Ht <= 58 in | Wt 120.7 lb

## 2011-03-29 DIAGNOSIS — F411 Generalized anxiety disorder: Secondary | ICD-10-CM

## 2011-03-29 DIAGNOSIS — E538 Deficiency of other specified B group vitamins: Secondary | ICD-10-CM

## 2011-03-29 DIAGNOSIS — I10 Essential (primary) hypertension: Secondary | ICD-10-CM

## 2011-03-29 DIAGNOSIS — Z1239 Encounter for other screening for malignant neoplasm of breast: Secondary | ICD-10-CM

## 2011-03-29 LAB — CBC WITH DIFFERENTIAL/PLATELET
Basophils Absolute: 0 10*3/uL (ref 0.0–0.1)
Basophils Relative: 0 % (ref 0–1)
Eosinophils Absolute: 0.2 10*3/uL (ref 0.0–0.7)
Eosinophils Relative: 2 % (ref 0–5)
HCT: 34.6 % — ABNORMAL LOW (ref 36.0–46.0)
Hemoglobin: 11.3 g/dL — ABNORMAL LOW (ref 12.0–15.0)
Lymphocytes Relative: 27 % (ref 12–46)
Lymphs Abs: 2.2 10*3/uL (ref 0.7–4.0)
MCH: 29.3 pg (ref 26.0–34.0)
MCHC: 32.7 g/dL (ref 30.0–36.0)
MCV: 89.6 fL (ref 78.0–100.0)
Monocytes Absolute: 0.6 10*3/uL (ref 0.1–1.0)
Monocytes Relative: 7 % (ref 3–12)
Neutro Abs: 5.2 10*3/uL (ref 1.7–7.7)
Neutrophils Relative %: 64 % (ref 43–77)
Platelets: 314 10*3/uL (ref 150–400)
RBC: 3.86 MIL/uL — ABNORMAL LOW (ref 3.87–5.11)
RDW: 13.2 % (ref 11.5–15.5)
WBC: 8.1 10*3/uL (ref 4.0–10.5)

## 2011-03-29 LAB — BASIC METABOLIC PANEL
BUN: 20 mg/dL (ref 6–23)
CO2: 24 mEq/L (ref 19–32)
Calcium: 9.7 mg/dL (ref 8.4–10.5)
Chloride: 98 mEq/L (ref 96–112)
Creat: 1.12 mg/dL — ABNORMAL HIGH (ref 0.50–1.10)
Glucose, Bld: 94 mg/dL (ref 70–99)
Potassium: 4.7 mEq/L (ref 3.5–5.3)
Sodium: 134 mEq/L — ABNORMAL LOW (ref 135–145)

## 2011-03-29 MED ORDER — CYANOCOBALAMIN 1000 MCG/ML IJ SOLN
1000.0000 ug | Freq: Once | INTRAMUSCULAR | Status: AC
  Administered 2011-03-29: 1000 ug via INTRAMUSCULAR

## 2011-03-29 MED ORDER — CLONAZEPAM 0.5 MG PO TABS: 0.5000 mg | ORAL_TABLET | Freq: Every day | ORAL | Status: DC

## 2011-05-04 ENCOUNTER — Ambulatory Visit (INDEPENDENT_AMBULATORY_CARE_PROVIDER_SITE_OTHER): Payer: Medicare Other | Admitting: *Deleted

## 2011-05-04 DIAGNOSIS — D518 Other vitamin B12 deficiency anemias: Secondary | ICD-10-CM

## 2011-05-04 MED ORDER — CYANOCOBALAMIN 1000 MCG/ML IJ SOLN
1000.0000 ug | Freq: Once | INTRAMUSCULAR | Status: AC
  Administered 2011-05-04: 1000 ug via INTRAMUSCULAR

## 2011-05-23 LAB — CBC
HCT: 35 — ABNORMAL LOW
Hemoglobin: 11.5 — ABNORMAL LOW
MCHC: 32.9
MCV: 86.8
Platelets: 361
RBC: 4.03
RDW: 13.1
WBC: 14.1 — ABNORMAL HIGH

## 2011-05-23 LAB — COMPREHENSIVE METABOLIC PANEL
ALT: 12
AST: 25
Albumin: 4.3
Alkaline Phosphatase: 88
BUN: 16
CO2: 18 — ABNORMAL LOW
Calcium: 9.8
Chloride: 95 — ABNORMAL LOW
Creatinine, Ser: 1.18
GFR calc Af Amer: 56 — ABNORMAL LOW
GFR calc non Af Amer: 46 — ABNORMAL LOW
Glucose, Bld: 113 — ABNORMAL HIGH
Potassium: 3.9
Sodium: 129 — ABNORMAL LOW
Total Bilirubin: 1.2
Total Protein: 7.8

## 2011-05-23 LAB — URINALYSIS, ROUTINE W REFLEX MICROSCOPIC
Bilirubin Urine: NEGATIVE
Glucose, UA: NEGATIVE
Hgb urine dipstick: NEGATIVE
Ketones, ur: 15 — AB
Nitrite: NEGATIVE
Protein, ur: NEGATIVE
Specific Gravity, Urine: 1.012
Urobilinogen, UA: 0.2
pH: 8.5 — ABNORMAL HIGH

## 2011-05-23 LAB — DIFFERENTIAL
Basophils Absolute: 0
Basophils Relative: 0
Eosinophils Absolute: 0
Eosinophils Relative: 0
Lymphocytes Relative: 4 — ABNORMAL LOW
Lymphs Abs: 0.5 — ABNORMAL LOW
Monocytes Absolute: 0.5
Monocytes Relative: 4
Neutro Abs: 13.1 — ABNORMAL HIGH
Neutrophils Relative %: 93 — ABNORMAL HIGH

## 2011-05-23 LAB — POCT CARDIAC MARKERS
CKMB, poc: <1 — ABNORMAL LOW
Myoglobin, poc: 148
Operator id: 146091
Troponin i, poc: <0.05

## 2011-05-23 LAB — LIPASE, BLOOD: Lipase: 28

## 2011-06-04 ENCOUNTER — Ambulatory Visit (INDEPENDENT_AMBULATORY_CARE_PROVIDER_SITE_OTHER): Payer: Medicare Other | Admitting: *Deleted

## 2011-06-04 DIAGNOSIS — Z23 Encounter for immunization: Secondary | ICD-10-CM

## 2011-06-04 DIAGNOSIS — E538 Deficiency of other specified B group vitamins: Secondary | ICD-10-CM

## 2011-06-04 MED ORDER — CYANOCOBALAMIN 1000 MCG/ML IJ SOLN
1000.0000 ug | Freq: Once | INTRAMUSCULAR | Status: AC
  Administered 2011-06-04: 1000 ug via INTRAMUSCULAR

## 2011-07-05 ENCOUNTER — Ambulatory Visit (INDEPENDENT_AMBULATORY_CARE_PROVIDER_SITE_OTHER): Payer: Medicare Other | Admitting: *Deleted

## 2011-07-05 DIAGNOSIS — D518 Other vitamin B12 deficiency anemias: Secondary | ICD-10-CM

## 2011-07-05 MED ORDER — CYANOCOBALAMIN 1000 MCG/ML IJ SOLN
1000.0000 ug | Freq: Once | INTRAMUSCULAR | Status: AC
  Administered 2011-08-03: 1000 ug via INTRAMUSCULAR

## 2011-08-03 ENCOUNTER — Ambulatory Visit (INDEPENDENT_AMBULATORY_CARE_PROVIDER_SITE_OTHER): Payer: Medicare Other | Admitting: *Deleted

## 2011-08-03 DIAGNOSIS — D518 Other vitamin B12 deficiency anemias: Secondary | ICD-10-CM

## 2011-08-03 MED ORDER — CYANOCOBALAMIN 1000 MCG/ML IJ SOLN: 1000.0000 ug | Freq: Once | INTRAMUSCULAR | Status: DC

## 2011-08-24 NOTE — Progress Notes (Signed)
Subjective:   Patient ID: Elizabeth Woodward female   DOB: Mar 17, 1941 71 y.o.   MRN: [id number]  HPI: [name] is a 71 y.o. with HTN and anxiety in for routine follow up visit. No complaints, doing well.    Past Medical History  Diagnosis Date  . History of melanoma     status post excision of lesion 2006, dermatology  re-eva 2011  . Intravenous bisphosphonates causing adverse effect in therapeutic use     Glu-like illness and electrolyte disturbance  . Anxiety   . Hyperlipidemia      statin intolerance severe, myopathy  . Hypertension   . Angioedema     secondary to niacin  . Hypercalcemia     possibloe association with HCTZ but patient wants to continue diuretic  . Hypomagnesemia     secondary to bisphosphonate IV  . Raynaud's phenomenon     negative ANA, ESR  . Osteoporosis   . GERD (gastroesophageal reflux disease)    Current Outpatient Prescriptions  Medication Sig Dispense Refill  . aspirin 81 MG tablet Take 81 mg by mouth daily.        . calcium-vitamin D (OSCAL WITH D 500-200) 500-200 MG-UNIT per tablet Take 1 tablet by mouth daily.        . carvedilol (COREG) 12.5 MG tablet Take 1 tablet (12.5 mg total) by mouth 2 (two) times daily.  60 tablet  11  . Cholecalciferol (VITAMIN D-3 PO) Take 2 tablets by mouth daily.       . clonazePAM (KLONOPIN) 0.5 MG tablet Take 1 tablet (0.5 mg total) by mouth at bedtime. For anxiety.    30 tablet  5  . cyanocobalamin 1000 MCG/ML injection Inject 1,000 mcg into the muscle every 30 (thirty) days.        . famotidine (PEPCID) 20 MG tablet Take 20 mg by mouth 2 (two) times daily. Take in AM and PM       . hydrochlorothiazide 25 MG tablet TAKE 1 TABLET EVERY DAY  30 tablet  10  . lisinopril (PRINIVIL,ZESTRIL) 40 MG tablet TAKE 1 TABLET EVERY DAY  30 tablet  10  . Multiple Vitamin (MULTIVITAMIN) tablet Take 1 tablet by mouth daily.        . Omega 3-6-9 Fatty Acids (OMEGA-3 & OMEGA-6 FISH OIL PO) Take 1 tablet by mouth daily.         . Wheat Dextrin (BENEFIBER) TABS Take 1 tablet by mouth 2 (two) times daily. Or as directed.        Family History  Problem Relation Age of Onset  . Heart disease Mother   . Hypertension Mother   . Heart disease Father   . Cancer Brother 22    prostate and lung   History   Social History  . Marital Status: Single    Spouse Name: N/A    Number of Children: N/A  . Years of Education: N/A   Occupational History  . retired    Social History Main Topics  . Smoking status: Former Smoker -- 0.2 packs/day for 5 years    Types: Cigarettes    Quit date: 05/06/2010  . Smokeless tobacco: Never Used  . Alcohol Use: No  . Drug Use: No  . Sexually Active: No   Other Topics Concern  . None   Social History Narrative  . None   Review of Systems: Constitutional: Denies fever, chills, diaphoresis, appetite change and fatigue.  HEENT: Denies photophobia, eye pain, redness, hearing  loss, ear pain, congestion, sore throat, rhinorrhea, sneezing, mouth sores, trouble swallowing, neck pain, neck stiffness and tinnitus.   Respiratory: Denies SOB, DOE, cough, chest tightness,  and wheezing.   Cardiovascular: Denies chest pain, palpitations and leg swelling.  Gastrointestinal: Denies nausea, vomiting, abdominal pain, diarrhea, constipation, blood in stool and abdominal distention.  Genitourinary: Denies dysuria, urgency, frequency, hematuria, flank pain and difficulty urinating.  Musculoskeletal: Denies myalgias, back pain, joint swelling, arthralgias and gait problem.  Skin: Denies pallor, rash and wound.  Neurological: Denies dizziness, seizures, syncope, weakness, light-headedness, numbness and headaches.  Hematological: Denies adenopathy. Easy bruising, personal or family bleeding history  Psychiatric/Behavioral: Denies suicidal ideation, mood changes, confusion, nervousness, sleep disturbance and agitation  Objective:  Physical Exam: Filed Vitals:   03/29/11 1125  BP: 140/79    Pulse: 74  Temp: 98.8 F (37.1 C)  TempSrc: Oral  Height: 4\' 10"  (1.473 m)  Weight: 54.749 kg (120 lb 11.2 oz)  SpO2: 98%   Constitutional: Vital signs reviewed.  Patient is a well-developed and well-nourished woman in no acute distress and cooperative with exam. Alert and oriented x3.  Head: Normocephalic and atraumatic Ear: TM normal bilaterally Mouth: no erythema or exudates, MMM Eyes: PERRL, EOMI, conjunctivae normal, No scleral icterus.  Neck: Supple, Trachea midline normal ROM, No JVD, mass, thyromegaly, or carotid bruit present.  Cardiovascular: RRR, S1 normal, S2 normal, no MRG, pulses symmetric and intact bilaterally Pulmonary/Chest: CTAB, no wheezes, rales, or rhonchi Abdominal: Soft. Non-tender, non-distended, bowel sounds are normal, no masses, organomegaly, or guarding present.  GU: no CVA tenderness Musculoskeletal: No joint deformities, erythema, or stiffness, ROM full and no nontender Hematology: no cervical, inginal, or axillary adenopathy.  Neurological: A&O x3, Strenght is normal and symmetric bilaterally, cranial nerve II-XII are grossly intact, no focal motor deficit, sensory intact to light touch bilaterally.  Skin: Warm, dry and intact. No rash, cyanosis, or clubbing.  Psychiatric: Normal mood and affect. speech and behavior is normal. Judgment and thought content normal. Cognition and memory are normal.   Assessment & Plan:   1. HTN continue current medication , continue to monitor HCTZ for serum sodium reduction and mild renal insuf. Also monitor calcium levels. Otherwise continue current meds  2. Preventive care today, check FLP, schedule mammo, immunizations  3. Anxiety: willc ontinue Klonopin.

## 2011-09-03 ENCOUNTER — Ambulatory Visit (INDEPENDENT_AMBULATORY_CARE_PROVIDER_SITE_OTHER): Payer: Medicare Other | Admitting: *Deleted

## 2011-09-03 DIAGNOSIS — D518 Other vitamin B12 deficiency anemias: Secondary | ICD-10-CM

## 2011-09-03 MED ORDER — CYANOCOBALAMIN 1000 MCG/ML IJ SOLN
1000.0000 ug | Freq: Once | INTRAMUSCULAR | Status: AC
  Administered 2011-09-03: 1000 ug via INTRAMUSCULAR

## 2011-09-28 ENCOUNTER — Other Ambulatory Visit: Payer: Self-pay | Admitting: Internal Medicine

## 2011-10-02 ENCOUNTER — Other Ambulatory Visit: Payer: Self-pay | Admitting: *Deleted

## 2011-10-02 ENCOUNTER — Other Ambulatory Visit: Payer: Self-pay | Admitting: Internal Medicine

## 2011-10-02 DIAGNOSIS — F411 Generalized anxiety disorder: Secondary | ICD-10-CM

## 2011-10-04 ENCOUNTER — Ambulatory Visit (INDEPENDENT_AMBULATORY_CARE_PROVIDER_SITE_OTHER): Payer: Medicare Other | Admitting: *Deleted

## 2011-10-04 DIAGNOSIS — D518 Other vitamin B12 deficiency anemias: Secondary | ICD-10-CM

## 2011-10-04 MED ORDER — CYANOCOBALAMIN 1000 MCG/ML IJ SOLN
1000.0000 ug | Freq: Once | INTRAMUSCULAR | Status: AC
  Administered 2011-10-04: 1000 ug via INTRAMUSCULAR

## 2011-10-04 MED ORDER — CLONAZEPAM 0.5 MG PO TABS: 0.5000 mg | ORAL_TABLET | Freq: Every day | ORAL | Status: DC

## 2011-10-04 NOTE — Telephone Encounter (Signed)
Klonopin rx called to [facility]. 

## 2011-10-17 ENCOUNTER — Encounter: Payer: Medicare Other | Admitting: Internal Medicine

## 2011-10-31 ENCOUNTER — Ambulatory Visit (INDEPENDENT_AMBULATORY_CARE_PROVIDER_SITE_OTHER): Payer: Medicare Other | Admitting: Internal Medicine

## 2011-10-31 ENCOUNTER — Encounter: Payer: Self-pay | Admitting: Internal Medicine

## 2011-10-31 VITALS — BP 146/73 | HR 81 | Temp 96.8°F | Ht <= 58 in | Wt 123.2 lb

## 2011-10-31 DIAGNOSIS — D518 Other vitamin B12 deficiency anemias: Secondary | ICD-10-CM

## 2011-10-31 DIAGNOSIS — F411 Generalized anxiety disorder: Secondary | ICD-10-CM

## 2011-10-31 DIAGNOSIS — Z1211 Encounter for screening for malignant neoplasm of colon: Secondary | ICD-10-CM

## 2011-10-31 DIAGNOSIS — D519 Vitamin B12 deficiency anemia, unspecified: Secondary | ICD-10-CM

## 2011-10-31 DIAGNOSIS — Z129 Encounter for screening for malignant neoplasm, site unspecified: Secondary | ICD-10-CM

## 2011-10-31 DIAGNOSIS — I10 Essential (primary) hypertension: Secondary | ICD-10-CM | POA: Diagnosis not present

## 2011-10-31 DIAGNOSIS — Z0000 Encounter for general adult medical examination without abnormal findings: Secondary | ICD-10-CM

## 2011-10-31 MED ORDER — CYANOCOBALAMIN 1000 MCG/ML IJ SOLN
1000.0000 ug | Freq: Once | INTRAMUSCULAR | Status: AC
  Administered 2011-10-31: 1000 ug via INTRAMUSCULAR

## 2011-10-31 NOTE — Patient Instructions (Signed)
You have received your B12 injection today.  At your next visit, we can consider re-checking your B12 level to determine the need for these injections in the future.  Your blood pressure is well controlled today, continue your current medications.  Please return for a follow-up visit in 6 months.

## 2011-11-01 DIAGNOSIS — Z0000 Encounter for general adult medical examination without abnormal findings: Secondary | ICD-10-CM | POA: Insufficient documentation

## 2011-11-01 NOTE — Assessment & Plan Note (Signed)
History of vit B12 anemia, though review of prior B12 levels show relatively normal results.  Patient believes B12 shots give her more energy -B12 shot today.  Will continue shots, but re-check B12 level at next visit to determine whether to continue with shots

## 2011-11-01 NOTE — Progress Notes (Signed)
HPI The patient is a 71 yo woman, history of anxiety, HL, HTN, osteoporosis, and vit B12 deficiency, presenting for routine follow-up visit.  The patient notes no acute complaints.  She has been getting monthly B12 injections, which she believes are improving her energy.  She notes a long-standing history of anxiety, controlled with clonazepam.  She has a history of hyperlipidemia, but has had myopathy with statins, and angioedema with niacin.  ROS: General: no fevers, chills, changes in weight, changes in appetite Skin: no rash HEENT: no blurry vision, hearing changes, sore throat Pulm: no dyspnea, coughing, wheezing CV: no chest pain, palpitations, shortness of breath Abd: no abdominal pain, nausea/vomiting, diarrhea/constipation GU: no dysuria, hematuria, polyuria Ext: no arthralgias, myalgias Neuro: no weakness, numbness, or tingling  Filed Vitals:   10/31/11 1522  BP: 146/73  Pulse: 81  Temp: 96.8 F (36 C)    PEX General: alert, cooperative, and in no apparent distress, appears mildly anxious HEENT: pupils equal round and reactive to light, vision grossly intact, oropharynx clear and non-erythematous  Neck: supple, no lymphadenopathy Lungs: clear to ascultation bilaterally, normal work of respiration, no wheezes, rales, ronchi Heart: regular rate and rhythm, no murmurs, gallops, or rubs Abdomen: soft, non-tender, non-distended, normal bowel sounds Msk: no joint edema, warmth, or erythema Extremities: no cyanosis, clubbing, or edema Neurologic: alert & oriented X3, cranial nerves II-XII intact, strength grossly intact, sensation intact to light touch  Assessment/Plan

## 2011-11-01 NOTE — Assessment & Plan Note (Signed)
Patient given FOBT cards

## 2011-11-01 NOTE — Assessment & Plan Note (Signed)
Mildly elevated today, but patient notes not taking one of her BP medications this morning -continue current regimen

## 2011-11-01 NOTE — Assessment & Plan Note (Signed)
History of anxiety, treated with clonazepam long-term -continue clonazepam -signed medication contract today

## 2011-12-03 ENCOUNTER — Ambulatory Visit: Payer: Medicare Other

## 2012-01-01 ENCOUNTER — Ambulatory Visit (INDEPENDENT_AMBULATORY_CARE_PROVIDER_SITE_OTHER): Payer: Medicare Other | Admitting: *Deleted

## 2012-01-01 DIAGNOSIS — D518 Other vitamin B12 deficiency anemias: Secondary | ICD-10-CM

## 2012-01-01 MED ORDER — CYANOCOBALAMIN 1000 MCG/ML IJ SOLN
1000.0000 ug | Freq: Once | INTRAMUSCULAR | Status: AC
  Administered 2012-01-01: 1000 ug via INTRAMUSCULAR

## 2012-01-27 ENCOUNTER — Other Ambulatory Visit: Payer: Self-pay | Admitting: Internal Medicine

## 2012-01-31 ENCOUNTER — Ambulatory Visit (INDEPENDENT_AMBULATORY_CARE_PROVIDER_SITE_OTHER): Payer: Medicare Other | Admitting: *Deleted

## 2012-01-31 DIAGNOSIS — D518 Other vitamin B12 deficiency anemias: Secondary | ICD-10-CM

## 2012-01-31 MED ORDER — CYANOCOBALAMIN 1000 MCG/ML IJ SOLN
1000.0000 ug | Freq: Once | INTRAMUSCULAR | Status: AC
  Administered 2012-01-31: 1000 ug via INTRAMUSCULAR

## 2012-02-29 ENCOUNTER — Ambulatory Visit (INDEPENDENT_AMBULATORY_CARE_PROVIDER_SITE_OTHER): Payer: Medicare Other | Admitting: *Deleted

## 2012-02-29 DIAGNOSIS — D518 Other vitamin B12 deficiency anemias: Secondary | ICD-10-CM | POA: Diagnosis not present

## 2012-02-29 MED ORDER — CYANOCOBALAMIN 1000 MCG/ML IJ SOLN
1000.0000 ug | Freq: Once | INTRAMUSCULAR | Status: AC
  Administered 2012-02-29: 1000 ug via INTRAMUSCULAR

## 2012-03-27 ENCOUNTER — Other Ambulatory Visit: Payer: Self-pay | Admitting: *Deleted

## 2012-03-27 DIAGNOSIS — F411 Generalized anxiety disorder: Secondary | ICD-10-CM

## 2012-03-28 MED ORDER — CLONAZEPAM 0.5 MG PO TABS: 0.5000 mg | ORAL_TABLET | Freq: Every day | ORAL | Status: DC

## 2012-03-28 NOTE — Telephone Encounter (Signed)
Rx called in to pharmacy. 

## 2012-03-31 ENCOUNTER — Ambulatory Visit (INDEPENDENT_AMBULATORY_CARE_PROVIDER_SITE_OTHER): Payer: Medicare Other | Admitting: *Deleted

## 2012-03-31 DIAGNOSIS — D518 Other vitamin B12 deficiency anemias: Secondary | ICD-10-CM

## 2012-03-31 MED ORDER — CYANOCOBALAMIN 1000 MCG/ML IJ SOLN
1000.0000 ug | Freq: Once | INTRAMUSCULAR | Status: AC
  Administered 2012-03-31: 1000 ug via INTRAMUSCULAR

## 2012-04-30 ENCOUNTER — Ambulatory Visit (INDEPENDENT_AMBULATORY_CARE_PROVIDER_SITE_OTHER): Payer: Medicare Other | Admitting: *Deleted

## 2012-04-30 DIAGNOSIS — D518 Other vitamin B12 deficiency anemias: Secondary | ICD-10-CM

## 2012-04-30 MED ORDER — CYANOCOBALAMIN 1000 MCG/ML IJ SOLN: 1000.0000 ug | Freq: Once | INTRAMUSCULAR | Status: DC

## 2012-04-30 MED ORDER — CYANOCOBALAMIN 1000 MCG/ML IJ SOLN
1000.0000 ug | Freq: Once | INTRAMUSCULAR | Status: AC
  Administered 2012-04-30: 1000 ug via INTRAMUSCULAR

## 2012-05-28 ENCOUNTER — Other Ambulatory Visit: Payer: Self-pay | Admitting: Internal Medicine

## 2012-05-28 DIAGNOSIS — Z1231 Encounter for screening mammogram for malignant neoplasm of breast: Secondary | ICD-10-CM

## 2012-05-29 ENCOUNTER — Ambulatory Visit (INDEPENDENT_AMBULATORY_CARE_PROVIDER_SITE_OTHER): Payer: Medicare Other | Admitting: Internal Medicine

## 2012-05-29 ENCOUNTER — Encounter: Payer: Self-pay | Admitting: Internal Medicine

## 2012-05-29 VITALS — BP 133/68 | HR 69 | Temp 97.0°F | Ht 59.0 in | Wt 120.5 lb

## 2012-05-29 DIAGNOSIS — I10 Essential (primary) hypertension: Secondary | ICD-10-CM | POA: Diagnosis not present

## 2012-05-29 DIAGNOSIS — Z23 Encounter for immunization: Secondary | ICD-10-CM | POA: Diagnosis not present

## 2012-05-29 DIAGNOSIS — E785 Hyperlipidemia, unspecified: Secondary | ICD-10-CM | POA: Diagnosis not present

## 2012-05-29 DIAGNOSIS — D518 Other vitamin B12 deficiency anemias: Secondary | ICD-10-CM

## 2012-05-29 DIAGNOSIS — Z0000 Encounter for general adult medical examination without abnormal findings: Secondary | ICD-10-CM

## 2012-05-29 LAB — COMPLETE METABOLIC PANEL WITH GFR
ALT: 10 U/L (ref 0–35)
AST: 18 U/L (ref 0–37)
Albumin: 4.3 g/dL (ref 3.5–5.2)
Alkaline Phosphatase: 100 U/L (ref 39–117)
BUN: 22 mg/dL (ref 6–23)
CO2: 23 mEq/L (ref 19–32)
Calcium: 10 mg/dL (ref 8.4–10.5)
Chloride: 98 mEq/L (ref 96–112)
Creat: 1.31 mg/dL — ABNORMAL HIGH (ref 0.50–1.10)
GFR, Est African American: 48 mL/min — ABNORMAL LOW
GFR, Est Non African American: 41 mL/min — ABNORMAL LOW
Glucose, Bld: 99 mg/dL (ref 70–99)
Potassium: 4.7 mEq/L (ref 3.5–5.3)
Sodium: 131 mEq/L — ABNORMAL LOW (ref 135–145)
Total Bilirubin: 0.6 mg/dL (ref 0.3–1.2)
Total Protein: 7.2 g/dL (ref 6.0–8.3)

## 2012-05-29 LAB — CBC WITH DIFFERENTIAL/PLATELET
Basophils Absolute: 0 10*3/uL (ref 0.0–0.1)
Basophils Relative: 0 % (ref 0–1)
Eosinophils Absolute: 0.1 10*3/uL (ref 0.0–0.7)
Eosinophils Relative: 1 % (ref 0–5)
HCT: 32.1 % — ABNORMAL LOW (ref 36.0–46.0)
Hemoglobin: 11.3 g/dL — ABNORMAL LOW (ref 12.0–15.0)
Lymphocytes Relative: 24 % (ref 12–46)
Lymphs Abs: 1.9 10*3/uL (ref 0.7–4.0)
MCH: 30.1 pg (ref 26.0–34.0)
MCHC: 35.2 g/dL (ref 30.0–36.0)
MCV: 85.6 fL (ref 78.0–100.0)
Monocytes Absolute: 0.5 10*3/uL (ref 0.1–1.0)
Monocytes Relative: 6 % (ref 3–12)
Neutro Abs: 5.5 10*3/uL (ref 1.7–7.7)
Neutrophils Relative %: 69 % (ref 43–77)
Platelets: 338 10*3/uL (ref 150–400)
RBC: 3.75 MIL/uL — ABNORMAL LOW (ref 3.87–5.11)
RDW: 14.3 % (ref 11.5–15.5)
WBC: 8 10*3/uL (ref 4.0–10.5)

## 2012-05-29 LAB — LIPID PANEL
Cholesterol: 232 mg/dL — ABNORMAL HIGH (ref 0–200)
HDL: 40 mg/dL (ref >39)
LDL Cholesterol: 170 mg/dL — ABNORMAL HIGH (ref 0–99)
Total CHOL/HDL Ratio: 5.8 Ratio
Triglycerides: 110 mg/dL (ref <150)
VLDL: 22 mg/dL (ref 0–40)

## 2012-05-29 LAB — VITAMIN B12: Vitamin B-12: 666 pg/mL (ref 211–911)

## 2012-05-29 MED ORDER — CYANOCOBALAMIN 1000 MCG/ML IJ SOLN
1000.0000 ug | Freq: Once | INTRAMUSCULAR | Status: AC
  Administered 2012-05-29: 1000 ug via INTRAMUSCULAR

## 2012-05-29 NOTE — Patient Instructions (Signed)
We have given you a flu shot today.  It is important to get this shot once per year.  We have given you a pneumonia shot today.  This vaccine only requires one dose, so you will not need another injection in the future for this vaccine.  We are giving you a vitamin B12 injection today, and checking your B12 levels.  If your levels are still low, we can consider starting the vitamin B12 tablets, or we can continue B12 injections.  We are checking some routine lab work, and will call you if any results are abnormal.  If the results are normal, you will not hear from [place].  Please return in 6-7 months for your routine follow-up visit.

## 2012-05-29 NOTE — Progress Notes (Signed)
HPI The patient is a 71 y.o. yo female with a history of HTN, HL, anxiety, vit B12 anemia, presenting for a follow-up visit.  The patient notes no acute complaints.  BP is well-controlled today, on HCTZ, lisinopril, and coreg.  Lipid panel has shown an elevated LDL in the past, but the patient has had myopathy with statin and angioedema with niacin.  The patient's anxiety is well-controlled with clonazepam, for which she has a medication contract.  ROS: General: no fevers, chills, changes in weight, changes in appetite Skin: no rash HEENT: no blurry vision, hearing changes, sore throat Pulm: no dyspnea, coughing, wheezing CV: no chest pain, palpitations, shortness of breath Abd: no abdominal pain, nausea/vomiting, diarrhea/constipation GU: no dysuria, hematuria, polyuria Ext: no arthralgias, myalgias Neuro: no weakness, numbness, or tingling  Filed Vitals:   05/29/12 1036  BP: 133/68  Pulse: 69  Temp: 97 F (36.1 C)    PEX General: alert, cooperative, and in no apparent distress HEENT: pupils equal round and reactive to light, vision grossly intact, oropharynx clear and non-erythematous  Neck: supple, no lymphadenopathy Lungs: clear to ascultation bilaterally, normal work of respiration, no wheezes, rales, ronchi Heart: regular rate and rhythm, no murmurs, gallops, or rubs Abdomen: soft, non-tender, non-distended, normal bowel sounds Extremities: no cyanosis, clubbing, or edema Neurologic: alert & oriented X3, cranial nerves II-XII intact, strength grossly intact, sensation intact to light touch  Current Outpatient Prescriptions on File Prior to Visit  Medication Sig Dispense Refill  . aspirin 81 MG tablet Take 81 mg by mouth daily.        . calcium-vitamin D (OSCAL WITH D 500-200) 500-200 MG-UNIT per tablet Take 1 tablet by mouth daily.        . carvedilol (COREG) 12.5 MG tablet TAKE ONE TABLET BY MOUTH TWICE DAILY  60 tablet  6  . Cholecalciferol (VITAMIN D-3 PO) Take 2  tablets by mouth daily.       . clonazePAM (KLONOPIN) 0.5 MG tablet Take 1 tablet (0.5 mg total) by mouth at bedtime. For anxiety.  30 tablet  5  . famotidine (PEPCID) 20 MG tablet Take 20 mg by mouth 2 (two) times daily. Take in AM and PM       . hydrochlorothiazide (HYDRODIURIL) 25 MG tablet TAKE 1 TABLET EVERY DAY  30 tablet  11  . hydrochlorothiazide (HYDRODIURIL) 25 MG tablet TAKE 1 TABLET EVERY DAY  30 tablet  11  . lisinopril (PRINIVIL,ZESTRIL) 40 MG tablet TAKE 1 TABLET EVERY DAY  30 tablet  11  . lisinopril (PRINIVIL,ZESTRIL) 40 MG tablet TAKE 1 TABLET EVERY DAY  30 tablet  11  . Multiple Vitamin (MULTIVITAMIN) tablet Take 1 tablet by mouth daily.        . Omega 3-6-9 Fatty Acids (OMEGA-3 & OMEGA-6 FISH OIL PO) Take 1 tablet by mouth daily.        . Wheat Dextrin (BENEFIBER) TABS Take 1 tablet by mouth 2 (two) times daily. Or as directed.         Assessment/Plan

## 2012-05-29 NOTE — Assessment & Plan Note (Signed)
BP well controlled; continue current regimen.

## 2012-05-29 NOTE — Assessment & Plan Note (Signed)
Will check vit B12 levels today.  Based on results, we discussed either continuing injections, transitioning to PO B12 tablets, or stopping altogether. -check B12 level -administered B12 shot today

## 2012-05-29 NOTE — Assessment & Plan Note (Addendum)
The patient's LDL has been elevated above goal of <130 in the past.  Patient has had myopathy with statin and angioedema with niacin.  Her [place] 10-year risk based on her last lipid panel is 19.59, which is sufficiently high to retry a trial of statin. -check lipid profile today -consider starting pravastatin based on lipid profile results, given its lower incidence of myopathy  Addendum: LDL is still elevated.  Full chart review reveals that patient had intolerable myopathy with both Pravastatin (2009) and Lovastatin (2010), as well as angioedema with niacin.  Fluvastatin has been shown, like pravastatin, to have less myopathy.  Discussed risks/benefits with patient, but patient is not interested in re-trying statin at this time.  We'll continue fish oil and diet/exercise.

## 2012-05-29 NOTE — Assessment & Plan Note (Signed)
-  flu shot given today -pneumovax given today -patient has mammogram scheduled for next month.  We discussed discontinuing mammograms after this year, given patient's age.  Patient would like to think it over, will readdress at next visit. -patient amenable to FOBT cards, insists she filled these out at her last visit 6 months ago.  These labs are not in our system, not sure what happened.  Will retry FOBT cards at our next visit.

## 2012-06-06 ENCOUNTER — Encounter: Payer: Self-pay | Admitting: Internal Medicine

## 2012-06-19 ENCOUNTER — Ambulatory Visit (HOSPITAL_COMMUNITY)
Admission: RE | Admit: 2012-06-19 | Discharge: 2012-06-19 | Disposition: A | Discharge status: Home / Self Care | Payer: Medicare Other | Source: Ambulatory Visit | Attending: Emergency Medicine | Admitting: Emergency Medicine

## 2012-06-19 DIAGNOSIS — Z1231 Encounter for screening mammogram for malignant neoplasm of breast: Secondary | ICD-10-CM | POA: Insufficient documentation

## 2012-06-30 ENCOUNTER — Ambulatory Visit: Payer: Medicare Other

## 2012-07-07 ENCOUNTER — Ambulatory Visit (INDEPENDENT_AMBULATORY_CARE_PROVIDER_SITE_OTHER): Payer: Medicare Other | Admitting: *Deleted

## 2012-07-07 DIAGNOSIS — D518 Other vitamin B12 deficiency anemias: Secondary | ICD-10-CM

## 2012-07-07 MED ORDER — CYANOCOBALAMIN 1000 MCG/ML IJ SOLN
1000.0000 ug | Freq: Once | INTRAMUSCULAR | Status: AC
  Administered 2012-07-07: 1000 ug via INTRAMUSCULAR

## 2012-08-08 ENCOUNTER — Telehealth: Payer: Self-pay | Admitting: *Deleted

## 2012-08-08 NOTE — Telephone Encounter (Signed)
Pt calls and ask when you are going to call in her b12 tablets, [facility] uses [facility], [address]

## 2012-08-14 MED ORDER — CYANOCOBALAMIN 1000 MCG PO TABS: 1000.0000 ug | ORAL_TABLET | Freq: Every day | ORAL | Status: AC

## 2012-08-14 NOTE — Telephone Encounter (Signed)
Thank you for reminder.  Prescription sent to pharmacy.

## 2012-09-02 ENCOUNTER — Other Ambulatory Visit: Payer: Self-pay | Admitting: *Deleted

## 2012-09-02 MED ORDER — CARVEDILOL 12.5 MG PO TABS: 12.5000 mg | ORAL_TABLET | Freq: Two times a day (BID) | ORAL | Status: DC

## 2012-09-02 NOTE — Telephone Encounter (Signed)
Pt states she is completely out ?

## 2012-09-26 ENCOUNTER — Other Ambulatory Visit: Payer: Self-pay | Admitting: *Deleted

## 2012-09-26 ENCOUNTER — Other Ambulatory Visit: Payer: Self-pay | Admitting: Internal Medicine

## 2012-09-26 DIAGNOSIS — F411 Generalized anxiety disorder: Secondary | ICD-10-CM

## 2012-09-26 MED ORDER — LISINOPRIL 40 MG PO TABS: ORAL_TABLET | ORAL | Status: DC

## 2012-09-26 MED ORDER — CLONAZEPAM 0.5 MG PO TABS: 0.5000 mg | ORAL_TABLET | Freq: Every day | ORAL | Status: DC

## 2012-09-26 NOTE — Telephone Encounter (Signed)
Clonazepam rx called to [facility]. 

## 2012-10-28 ENCOUNTER — Other Ambulatory Visit: Payer: Self-pay | Admitting: Internal Medicine

## 2013-01-15 ENCOUNTER — Encounter: Payer: Self-pay | Admitting: Internal Medicine

## 2013-01-28 ENCOUNTER — Encounter: Payer: Self-pay | Admitting: Internal Medicine

## 2013-01-28 ENCOUNTER — Ambulatory Visit (INDEPENDENT_AMBULATORY_CARE_PROVIDER_SITE_OTHER): Payer: Medicare Other | Admitting: Internal Medicine

## 2013-01-28 VITALS — BP 128/72 | HR 80 | Temp 97.3°F | Ht 58.5 in | Wt 118.8 lb

## 2013-01-28 DIAGNOSIS — I10 Essential (primary) hypertension: Secondary | ICD-10-CM

## 2013-01-28 DIAGNOSIS — Z1211 Encounter for screening for malignant neoplasm of colon: Secondary | ICD-10-CM | POA: Diagnosis not present

## 2013-01-28 DIAGNOSIS — Z0000 Encounter for general adult medical examination without abnormal findings: Secondary | ICD-10-CM

## 2013-01-28 NOTE — Progress Notes (Signed)
HPI The patient is a 72 y.o. female with a history of HTN, HL, anxiety, presenting for a routine follow-up.  The patient notes no acute complaints.  We discussed colon cancer screening, and the patient states she does not want to undergo a colonoscopy again, but is willing to fill out FOBT cards.  The patient is not interested in Zostavax at this time.  The patient's BP was initially somewhat elevated, but had normalized on recheck.  ROS: General: no fevers, chills, changes in weight, changes in appetite Skin: no rash HEENT: no blurry vision, hearing changes, sore throat Pulm: no dyspnea, coughing, wheezing CV: no chest pain, palpitations, shortness of breath Abd: no abdominal pain, nausea/vomiting, diarrhea/constipation GU: no dysuria, hematuria, polyuria Ext: no arthralgias, myalgias Neuro: no weakness, numbness, or tingling  Filed Vitals:   01/28/13 1334  BP: 128/72  Pulse: 80  Temp:     PEX General: alert, cooperative, and in no apparent distress HEENT: pupils equal round and reactive to light, vision grossly intact, oropharynx clear and non-erythematous  Neck: supple, no lymphadenopathy, thyroid mobile, non-tender, with no nodules Lungs: clear to ascultation bilaterally, normal work of respiration, no wheezes, rales, ronchi Heart: regular rate and rhythm, no murmurs, gallops, or rubs Abdomen: soft, non-tender, non-distended, normal bowel sounds Extremities: no cyanosis, clubbing, or edema Neurologic: alert & oriented X3, cranial nerves II-XII intact, strength grossly intact, sensation intact to light touch  Current Outpatient Prescriptions on File Prior to Visit  Medication Sig Dispense Refill  . aspirin 81 MG tablet Take 81 mg by mouth daily.        . calcium-vitamin D (OSCAL WITH D 500-200) 500-200 MG-UNIT per tablet Take 1 tablet by mouth daily.        . carvedilol (COREG) 12.5 MG tablet Take 1 tablet (12.5 mg total) by mouth 2 (two) times daily.  60 tablet  11  .  Cholecalciferol (VITAMIN D-3 PO) Take 2 tablets by mouth daily.       . clonazePAM (KLONOPIN) 0.5 MG tablet Take 1 tablet (0.5 mg total) by mouth at bedtime. For anxiety.  30 tablet  5  . cyanocobalamin ([facility] VITAMIN B12) 1000 MCG tablet Take 1 tablet (1,000 mcg total) by mouth daily.  90 tablet  3  . famotidine (PEPCID) 20 MG tablet Take 20 mg by mouth 2 (two) times daily. Take in AM and PM       . hydrochlorothiazide (HYDRODIURIL) 25 MG tablet TAKE 1 TABLET EVERY DAY  30 tablet  11  . lisinopril (PRINIVIL,ZESTRIL) 40 MG tablet TAKE 1 TABLET EVERY DAY  30 tablet  11  . Multiple Vitamin (MULTIVITAMIN) tablet Take 1 tablet by mouth daily.        . Omega 3-6-9 Fatty Acids (OMEGA-3 & OMEGA-6 FISH OIL PO) Take 1 tablet by mouth daily.        . Wheat Dextrin (BENEFIBER) TABS Take 1 tablet by mouth 2 (two) times daily. Or as directed.        No current facility-administered medications on file prior to visit.    Assessment/Plan

## 2013-01-28 NOTE — Progress Notes (Signed)
Case discussed with Dr. Brown at the time of the visit.  We reviewed the resident's history and exam and pertinent patient test results.  I agree with the assessment, diagnosis, and plan of care documented in the resident's note. 

## 2013-01-28 NOTE — Assessment & Plan Note (Signed)
BP Readings from Last 3 Encounters:  01/28/13 128/72  05/29/12 133/68  10/31/11 146/73    Lab Results  Component Value Date   NA 131* 05/29/2012   K 4.7 05/29/2012   CREATININE 1.31* 05/29/2012    Assessment: Blood pressure control: controlled Progress toward BP goal:  at goal Comments: Well-controlled  Plan: Medications:  continue current medications Educational resources provided:   Self management tools provided:   Other plans: Recheck at next visit

## 2013-01-28 NOTE — Assessment & Plan Note (Signed)
-  Patient given FOBT cards today -patient declined zostavax, will re-address at future visits

## 2013-01-28 NOTE — Patient Instructions (Signed)
General Instructions: Your blood pressure is well-controlled today, good job!  To screen for colon cancer, we are sending you home with cards to check for hidden blood in the stool.  Please follow the instructions, and send these back in the enclosed envelope.  Please return for a routine follow-up visit in 6 months   Treatment Goals:  Goals (1 Years of Data) as of 01/28/13   None      Progress Toward Treatment Goals:  Treatment Goal 01/28/2013  Blood pressure at goal    Self Care Goals & Plans:       Care Management & Community Referrals:  Referral 01/28/2013  Referrals made for care management support none needed

## 2013-02-23 ENCOUNTER — Other Ambulatory Visit (INDEPENDENT_AMBULATORY_CARE_PROVIDER_SITE_OTHER): Payer: Medicare Other

## 2013-02-23 DIAGNOSIS — Z1211 Encounter for screening for malignant neoplasm of colon: Secondary | ICD-10-CM

## 2013-02-23 LAB — POC HEMOCCULT BLD/STL (HOME/3-CARD/SCREEN)
Card #2 Fecal Occult Blod, POC: NEGATIVE
Card #3 Fecal Occult Blood, POC: NEGATIVE
Fecal Occult Blood, POC: NEGATIVE

## 2013-04-02 ENCOUNTER — Other Ambulatory Visit: Payer: Self-pay | Admitting: *Deleted

## 2013-04-02 DIAGNOSIS — F411 Generalized anxiety disorder: Secondary | ICD-10-CM

## 2013-04-03 MED ORDER — CLONAZEPAM 0.5 MG PO TABS: 0.5000 mg | ORAL_TABLET | Freq: Every evening | ORAL | Status: DC | PRN

## 2013-04-03 NOTE — Telephone Encounter (Signed)
Called to pharm 

## 2013-07-08 ENCOUNTER — Ambulatory Visit (INDEPENDENT_AMBULATORY_CARE_PROVIDER_SITE_OTHER): Payer: Medicare Other | Admitting: Internal Medicine

## 2013-07-08 ENCOUNTER — Encounter: Payer: Self-pay | Admitting: Internal Medicine

## 2013-07-08 VITALS — BP 136/84 | HR 84 | Temp 96.4°F | Ht 59.0 in | Wt 117.1 lb

## 2013-07-08 DIAGNOSIS — I10 Essential (primary) hypertension: Secondary | ICD-10-CM

## 2013-07-08 DIAGNOSIS — Z0000 Encounter for general adult medical examination without abnormal findings: Secondary | ICD-10-CM

## 2013-07-08 DIAGNOSIS — Z23 Encounter for immunization: Secondary | ICD-10-CM | POA: Diagnosis not present

## 2013-07-08 LAB — BASIC METABOLIC PANEL
BUN: 24 mg/dL — ABNORMAL HIGH (ref 6–23)
CO2: 25 mEq/L (ref 19–32)
Calcium: 9.9 mg/dL (ref 8.4–10.5)
Chloride: 99 mEq/L (ref 96–112)
Creat: 0.99 mg/dL (ref 0.50–1.10)
Glucose, Bld: 90 mg/dL (ref 70–99)
Potassium: 4.6 mEq/L (ref 3.5–5.3)
Sodium: 136 mEq/L (ref 135–145)

## 2013-07-08 LAB — CBC
HCT: 33.6 % — ABNORMAL LOW (ref 36.0–46.0)
Hemoglobin: 11.5 g/dL — ABNORMAL LOW (ref 12.0–15.0)
MCH: 29.6 pg (ref 26.0–34.0)
MCHC: 34.2 g/dL (ref 30.0–36.0)
MCV: 86.6 fL (ref 78.0–100.0)
Platelets: 368 K/uL (ref 150–400)
RBC: 3.88 MIL/uL (ref 3.87–5.11)
RDW: 14.2 % (ref 11.5–15.5)
WBC: 7.8 K/uL (ref 4.0–10.5)

## 2013-07-08 NOTE — Assessment & Plan Note (Signed)
-  flu shot given today -pt declines shingles vaccine, citing cost as a barrier -recent FOBT neg x3

## 2013-07-08 NOTE — Progress Notes (Signed)
HPI The patient is a 72 y.o. female with a history of HTN, osteopenia, B12 deficiency, presenting for a follow-up visit for HTN.  The patient's BP was initially elevated, attributable to getting lost walking here with resultant stress, but improved after resting.  The patient is amenable to flu shot today.  ROS: General: no fevers, chills, changes in weight, changes in appetite Skin: no rash HEENT: no blurry vision, hearing changes, sore throat Pulm: no dyspnea, coughing, wheezing CV: no chest pain, palpitations, shortness of breath Abd: no abdominal pain, nausea/vomiting, diarrhea/constipation GU: no dysuria, hematuria, polyuria Ext: no arthralgias, myalgias Neuro: no weakness, numbness, or tingling  Filed Vitals:   07/08/13 1402  BP: 136/84  Pulse: 84  Temp:     PEX General: alert, cooperative, and in no apparent distress HEENT: pupils equal round and reactive to light, vision grossly intact, oropharynx clear and non-erythematous  Neck: supple Lungs: clear to ascultation bilaterally, normal work of respiration, no wheezes, rales, ronchi Heart: regular rate and rhythm, no murmurs, gallops, or rubs Abdomen: soft, non-tender, non-distended, normal bowel sounds Extremities: no cyanosis, clubbing, or edema Neurologic: alert & oriented X3, cranial nerves II-XII intact, strength grossly intact, sensation intact to light touch  Current Outpatient Prescriptions on File Prior to Visit  Medication Sig Dispense Refill  . aspirin 81 MG tablet Take 81 mg by mouth daily.        . calcium-vitamin D (OSCAL WITH D 500-200) 500-200 MG-UNIT per tablet Take 1 tablet by mouth daily.        . carvedilol (COREG) 12.5 MG tablet Take 1 tablet (12.5 mg total) by mouth 2 (two) times daily.  60 tablet  11  . Cholecalciferol (VITAMIN D-3 PO) Take 2 tablets by mouth daily.       . clonazePAM (KLONOPIN) 0.5 MG tablet Take 1 tablet (0.5 mg total) by mouth at bedtime as needed for anxiety. For anxiety.  30  tablet  5  . cyanocobalamin ([facility] VITAMIN B12) 1000 MCG tablet Take 1 tablet (1,000 mcg total) by mouth daily.  90 tablet  3  . famotidine (PEPCID) 20 MG tablet Take 20 mg by mouth 2 (two) times daily. Take in AM and PM       . hydrochlorothiazide (HYDRODIURIL) 25 MG tablet TAKE 1 TABLET EVERY DAY  30 tablet  11  . lisinopril (PRINIVIL,ZESTRIL) 40 MG tablet TAKE 1 TABLET EVERY DAY  30 tablet  11  . Multiple Vitamin (MULTIVITAMIN) tablet Take 1 tablet by mouth daily.        . Omega 3-6-9 Fatty Acids (OMEGA-3 & OMEGA-6 FISH OIL PO) Take 1 tablet by mouth daily.        . Wheat Dextrin (BENEFIBER) TABS Take 1 tablet by mouth 2 (two) times daily. Or as directed.        No current facility-administered medications on file prior to visit.    Assessment/Plan

## 2013-07-08 NOTE — Addendum Note (Signed)
Addended by: Neomia Dear on: 07/08/2013 02:37 PM   Modules accepted: Orders

## 2013-07-08 NOTE — Assessment & Plan Note (Addendum)
BP Readings from Last 3 Encounters:  07/08/13 136/84  01/28/13 128/72  05/29/12 133/68    Lab Results  Component Value Date   NA 131* 05/29/2012   K 4.7 05/29/2012   CREATININE 1.31* 05/29/2012    Assessment: Blood pressure control: controlled Progress toward BP goal:  at goal Comments: Well-controlled  Plan: Medications:  continue current medications Educational resources provided:   Self management tools provided:   Other plans: Check BMET, CBC

## 2013-07-08 NOTE — Patient Instructions (Signed)
General Instructions: We are giving you a flu shot today.  You may have some soreness in your arm for the next couple of days, which is normal.  We are checking some routine blood work today.  We will contact you if the results are abnormal.  If you do not hear from [place], you may assume the results were normal.  Please return for a follow-up visit in 6 months.   Treatment Goals:  Goals (1 Years of Data) as of 07/08/13   None      Progress Toward Treatment Goals:  Treatment Goal 07/08/2013  Blood pressure at goal    Self Care Goals & Plans:  Self Care Goal 07/08/2013  Manage my medications take my medicines as prescribed; bring my medications to every visit; refill my medications on time  Eat healthy foods drink diet soda or water instead of juice or soda; eat smaller portions; eat foods that are low in salt; eat baked foods instead of fried foods    No flowsheet data found.   Care Management & Community Referrals:  Referral 07/08/2013  Referrals made for care management support none needed

## 2013-07-09 NOTE — Progress Notes (Signed)
Case discussed with Dr. Brown soon after the resident saw the patient.  We reviewed the resident's history and exam and pertinent patient test results.  I agree with the assessment, diagnosis, and plan of care documented in the resident's note. 

## 2013-08-28 ENCOUNTER — Other Ambulatory Visit: Payer: Self-pay | Admitting: Internal Medicine

## 2013-09-27 ENCOUNTER — Other Ambulatory Visit: Payer: Self-pay | Admitting: Internal Medicine

## 2013-09-28 ENCOUNTER — Other Ambulatory Visit: Payer: Self-pay | Admitting: *Deleted

## 2013-09-28 DIAGNOSIS — F411 Generalized anxiety disorder: Secondary | ICD-10-CM

## 2013-09-28 MED ORDER — CLONAZEPAM 0.5 MG PO TABS: 0.5000 mg | ORAL_TABLET | Freq: Every evening | ORAL | Status: DC | PRN

## 2013-09-29 NOTE — Telephone Encounter (Signed)
Rx called in to pharmacy. 

## 2013-10-27 ENCOUNTER — Other Ambulatory Visit: Payer: Self-pay | Admitting: Internal Medicine

## 2014-01-06 ENCOUNTER — Encounter: Payer: Self-pay | Admitting: Internal Medicine

## 2014-01-06 ENCOUNTER — Ambulatory Visit (INDEPENDENT_AMBULATORY_CARE_PROVIDER_SITE_OTHER): Payer: Medicare Other | Admitting: Internal Medicine

## 2014-01-06 VITALS — BP 149/78 | HR 62 | Temp 97.4°F | Ht 59.0 in | Wt 117.2 lb

## 2014-01-06 DIAGNOSIS — I10 Essential (primary) hypertension: Secondary | ICD-10-CM | POA: Diagnosis not present

## 2014-01-06 DIAGNOSIS — J309 Allergic rhinitis, unspecified: Secondary | ICD-10-CM | POA: Diagnosis not present

## 2014-01-06 DIAGNOSIS — Z1211 Encounter for screening for malignant neoplasm of colon: Secondary | ICD-10-CM

## 2014-01-06 DIAGNOSIS — Z0000 Encounter for general adult medical examination without abnormal findings: Secondary | ICD-10-CM | POA: Diagnosis not present

## 2014-01-06 MED ORDER — VERAPAMIL HCL ER 120 MG PO TBCR: 120.0000 mg | EXTENDED_RELEASE_TABLET | Freq: Every day | ORAL | Status: DC

## 2014-01-06 NOTE — Progress Notes (Signed)
Attending physician note: Presenting problems, physical  findings, medications, reviewed with resident physician Dr. Ryan Brown and I concur with this management plan. Merrik Puebla, MD, FACP  Hematology-Oncology/Internal Medicine  

## 2014-01-06 NOTE — Progress Notes (Signed)
HPI The patient is a 73 y.o. female with a history of HTN, osteopenia, presenting for a follow-up visit for HTN.  The patient has a history of HTN.  BP has been mildly elevated at several prior visits, and again is borderline elevated today both initially and on recheck.  The patient notes compliance with her BP meds.  The patient notes symptoms of seasonal allergies, due to pollen, with symptoms of runny nose, sneezing, and watery eyes.  She has not tried any OTC allergy medications, but asks for a recommendation.  ROS: General: no fevers, chills, changes in weight, changes in appetite Skin: no rash HEENT: no blurry vision, hearing changes, sore throat Pulm: no dyspnea, coughing, wheezing CV: no chest pain, palpitations, shortness of breath Abd: no abdominal pain, nausea/vomiting, diarrhea/constipation GU: no dysuria, hematuria, polyuria Ext: no arthralgias, myalgias Neuro: no weakness, numbness, or tingling  Filed Vitals:   01/06/14 1343  BP: 149/78  Pulse: 62  Temp: 97.4 F (36.3 C)    PEX General: alert, cooperative, and in no apparent distress HEENT: pupils equal round and reactive to light, vision grossly intact, oropharynx clear and non-erythematous  Neck: supple Lungs: clear to ascultation bilaterally, normal work of respiration, no wheezes, rales, ronchi Heart: regular rate and rhythm, no murmurs, gallops, or rubs Abdomen: soft, non-tender, non-distended, normal bowel sounds Extremities: no cyanosis, clubbing, or edema Neurologic: alert & oriented X3, cranial nerves II-XII intact, strength grossly intact, sensation intact to light touch  Current Outpatient Prescriptions on File Prior to Visit  Medication Sig Dispense Refill  . aspirin 81 MG tablet Take 81 mg by mouth daily.        . calcium-vitamin D (OSCAL WITH D 500-200) 500-200 MG-UNIT per tablet Take 1 tablet by mouth daily.        . carvedilol (COREG) 12.5 MG tablet TAKE ONE TABLET BY MOUTH TWICE DAILY  60 tablet   11  . Cholecalciferol (VITAMIN D-3 PO) Take 2 tablets by mouth daily.       . clonazePAM (KLONOPIN) 0.5 MG tablet Take 1 tablet (0.5 mg total) by mouth at bedtime as needed for anxiety. For anxiety.  30 tablet  5  . famotidine (PEPCID) 20 MG tablet Take 20 mg by mouth 2 (two) times daily. Take in AM and PM       . hydrochlorothiazide (HYDRODIURIL) 25 MG tablet TAKE 1 TABLET BY MOUTH EVERY DAY  30 tablet  11  . lisinopril (PRINIVIL,ZESTRIL) 40 MG tablet Take 1 tablet (40 mg total) by mouth daily.  30 tablet  11  . Multiple Vitamin (MULTIVITAMIN) tablet Take 1 tablet by mouth daily.        . Omega 3-6-9 Fatty Acids (OMEGA-3 & OMEGA-6 FISH OIL PO) Take 1 tablet by mouth daily.        . Wheat Dextrin (BENEFIBER) TABS Take 1 tablet by mouth 2 (two) times daily. Or as directed.        No current facility-administered medications on file prior to visit.    Assessment/Plan

## 2014-01-06 NOTE — Progress Notes (Signed)
Attending physician note: Presenting problems, physical findings, medications, accurate as recorded above by resident physician Dr. Shana Chute. I concur with this management plan. Murriel Hopper, MD, [facility]  Hematology-Oncology/Internal Medicine

## 2014-01-06 NOTE — Patient Instructions (Signed)
General Instructions: Your blood pressure is somewhat elevated today.  Looking back, it has been mildly elevated for your past few visits. -we are starting Verapamil, 1 tablet once per day, to lower your blood pressure  For your allergies, try over-the-counter Flonase in each nostril daily as needed.  We have given you stool cards to look for hidden blood in your bowel movements.  Follow the instructions on the cards, and send these back to our lab.  Please return in 1-2 months for a blood pressure re-check.  Thank you for bringing your medicines today. This helps [place] keep you safe from mistakes.   Progress Toward Treatment Goals:  Treatment Goal 01/06/2014  Blood pressure unchanged    Self Care Goals & Plans:  Self Care Goal 01/06/2014  Manage my medications take my medicines as prescribed; bring my medications to every visit; refill my medications on time  Eat healthy foods drink diet soda or water instead of juice or soda; eat more vegetables; eat foods that are low in salt; eat baked foods instead of fried foods  Be physically active take a walk every day    No flowsheet data found.   Care Management & Community Referrals:  Referral 01/06/2014  Referrals made for care management support none needed

## 2014-01-06 NOTE — Assessment & Plan Note (Signed)
The patient notes symptoms of rhinorrhea and sneezing, likely due to allergic rhinitis.  The patient asks for an OTC medication that can help with these symptoms. -recommend starting OTC flonase

## 2014-01-06 NOTE — Assessment & Plan Note (Signed)
BP Readings from Last 3 Encounters:  01/06/14 149/78  07/08/13 136/84  01/28/13 128/72    Lab Results  Component Value Date   NA 136 07/08/2013   K 4.6 07/08/2013   CREATININE 0.99 07/08/2013    Assessment: Blood pressure control: mildly elevated Progress toward BP goal:  unchanged Comments: BP mildly elevated.  Pt notes compliance with meds.  Her current meds are either at their max dose, or limited by HR (metoprolol).  We will start Verapamil (instead of Amlodipine, per pt cost concerns)  Plan: Medications:  Start Verapamil 120 mg CR daily.  Continue lisinopril 40, HCTZ 25, Coreg 12.5 BID Educational resources provided:   Self management tools provided:   Other plans: Recheck at next visit

## 2014-01-06 NOTE — Assessment & Plan Note (Signed)
-  FOBT cards negative 02/2013.  Sent patient home with FOBT cards to complete for this year.

## 2014-02-08 ENCOUNTER — Encounter: Payer: Self-pay | Admitting: Internal Medicine

## 2014-02-08 ENCOUNTER — Ambulatory Visit (INDEPENDENT_AMBULATORY_CARE_PROVIDER_SITE_OTHER): Payer: Medicare Other | Admitting: Internal Medicine

## 2014-02-08 VITALS — BP 157/73 | HR 83 | Temp 97.4°F | Ht 59.0 in | Wt 115.9 lb

## 2014-02-08 DIAGNOSIS — F411 Generalized anxiety disorder: Secondary | ICD-10-CM

## 2014-02-08 DIAGNOSIS — I10 Essential (primary) hypertension: Secondary | ICD-10-CM

## 2014-02-08 MED ORDER — CLONAZEPAM 0.5 MG PO TABS: 0.5000 mg | ORAL_TABLET | Freq: Every evening | ORAL | Status: DC | PRN

## 2014-02-08 NOTE — Patient Instructions (Signed)
STOP taking Verapamil.  Please follow-up in 2 weeks for another BP recheck.   Thank you for bringing your medications today.

## 2014-02-08 NOTE — Assessment & Plan Note (Addendum)
  BP Readings from Last 3 Encounters:  02/08/14 157/73  01/06/14 149/78  07/08/13 136/84    Lab Results  Component Value Date   NA 136 07/08/2013   K 4.6 07/08/2013   CREATININE 0.99 07/08/2013    Assessment: Blood pressure control: controlled Progress toward BP goal:  improved Comments: BP today was 157/73 initially but 137/76 at recheck in room. Her log from past month showed BP ranging 90-110/50-75.    Plan: Medications:  HCTZ 25mg ; coreg 12.5mg  BID; lisinopril 40mg  Educational resources provided:   Self management tools provided:   Other plans: F/u in 2 weeks to reassess. Encourage patient to continue maintaining daily BP log and bringing meds

## 2014-02-08 NOTE — Progress Notes (Signed)
   Subjective:    Patient ID: Elizabeth Woodward, female    DOB: [birth year], 73 y.o.   MRN: [id number]  HPI Elizabeth Woodward is a 73 year old female with history of HTN and osteopenia who presents today for BP recheck.  At her last visit on 01/06/2014, she was started on verapamil 120mg  daily and was asked to continue with lisinopril 40mg , HCTZ 24mg , Coreg 12.5 BID. Complains of lip numbness, heart fluttering, and dizziness after taking the verapamil. She has kept a log of her BP over the last month and they have generally ranged from 90-110/50-75. Today, BP is 157/73 but on recheck it was 137/76.   She also complains of constipation for which she now takes prune juice which helps her.  Review of Systems  Constitutional: Negative.   HENT:       Lip numbness   Eyes: Negative.   Respiratory: Negative for shortness of breath.   Cardiovascular: Negative for chest pain and leg swelling.       "heart fluttering"  Gastrointestinal: Negative.   Endocrine: Negative.   Genitourinary: Negative.   Musculoskeletal: Negative.   Skin: Negative.   Allergic/Immunologic: Negative.   Neurological: Negative.   Hematological: Negative.   Psychiatric/Behavioral: Negative.        Objective:   Physical Exam  Constitutional: She is oriented to person, place, and time. No distress.  HENT:  Head: Normocephalic and atraumatic.  Cardiovascular: Normal rate, regular rhythm and normal heart sounds.   Pulmonary/Chest: Effort normal and breath sounds normal.  Musculoskeletal: She exhibits no edema.  Neurological: She is alert and oriented to person, place, and time.          Assessment & Plan:

## 2014-02-09 NOTE — Progress Notes (Signed)
INTERNAL MEDICINE TEACHING ATTENDING ADDENDUM - Aldine Contes, MD: I personally saw and evaluated Ms. Valvo in this clinic visit in conjunction with the resident, Dr. Posey Pronto. I have discussed patient's plan of care with medical resident during this visit. I have confirmed the physical exam findings and have read and agree with the clinic note including the plan with the following addition: - Pt with dizziness after starting verapamil - On exam- Heart sounds wnl, RRR, lungs are CTA b/l, No lower extremity edema - d/c verapamil for now - Pt instructed to maintain BP log and follow up in 2 weeks - Will consider low dose amlodipine if persistently elevated BP - case d/w resident and patient in detail

## 2014-02-22 ENCOUNTER — Encounter: Payer: Self-pay | Admitting: Internal Medicine

## 2014-02-22 ENCOUNTER — Ambulatory Visit (INDEPENDENT_AMBULATORY_CARE_PROVIDER_SITE_OTHER): Payer: Medicare Other | Admitting: Internal Medicine

## 2014-02-22 VITALS — BP 151/74 | HR 82 | Temp 98.4°F | Ht 59.0 in | Wt 115.3 lb

## 2014-02-22 DIAGNOSIS — I10 Essential (primary) hypertension: Secondary | ICD-10-CM

## 2014-02-22 NOTE — Patient Instructions (Addendum)
Please follow-up in 3-6 months with your PCP.   Thank you for bringing your medicines today. This helps [place] keep you safe from mistakes.  Please remember to bring your blood pressure machine and log at your next visit.  Thank you!

## 2014-02-22 NOTE — Progress Notes (Signed)
   Subjective:    Patient ID: Elizabeth Woodward, female    DOB: August 30, 1940, 73 y.o.   MRN: [id number]  HPI Elizabeth Woodward is a 73 year old female with history of HTN and osteopenia who presents today for BP recheck.   At her last visit on 7/6, she complained of limb numbness which she attributed to her verapamil 120mg  that had been started 6/3. Her BP log showed measurements ranging 90-110/50-75; her BP in office was initially 157/73 but came down to 137/76.   Her BP today is 151/74 though her log today shows BP ranging 93-120/45-72. Her other medications include lisinopril 40mg , HCTZ 24mg , Coreg 12.5 BID.   She denies dizziness, falls, chest pain, new headaches, palpitations, lip tingling.    Review of Systems  Cardiovascular: Negative for chest pain.  Neurological: Negative for dizziness, light-headedness and headaches.  All other systems reviewed and are negative.      Objective:   Physical Exam  Constitutional: She is oriented to person, place, and time. She appears well-developed and well-nourished. No distress.  HENT:  Head: Normocephalic and atraumatic.  Eyes: Conjunctivae are normal. Pupils are equal, round, and reactive to light.  Cardiovascular: Regular rhythm, normal heart sounds and intact distal pulses.  Exam reveals no gallop and no friction rub.   No murmur heard. Pulmonary/Chest: Effort normal and breath sounds normal. No respiratory distress. She has no wheezes. She has no rales.  Neurological: She is alert and oriented to person, place, and time. No cranial nerve deficit.  Skin: Skin is warm and dry. She is not diaphoretic.  Psychiatric: She has a normal mood and affect. Her behavior is normal.          Assessment & Plan:

## 2014-02-22 NOTE — Assessment & Plan Note (Signed)
BP Readings from Last 3 Encounters:  02/22/14 151/74  02/08/14 157/73  01/06/14 149/78    Lab Results  Component Value Date   NA 136 07/08/2013   K 4.6 07/08/2013   CREATININE 0.99 07/08/2013    Assessment: Blood pressure control: controlled Progress toward BP goal:  at goal Comments: Her office BP is likely the result of her anxiety, so comparison of her logged BP from this visit and the past visit shows stability. -She does have a few diastolic values that are in the 50s and one at 45, but she does not complain of symptoms.    Plan: Medications:  lisinopril 40mg , HCTZ 24mg , Coreg 12.5 BID Educational resources provided:   Self management tools provided:   Other plans: Continue current treatment regimen. -Advised patient to return with her log book and BP machine to see if there's any discrepancy between her home machine and the one here in clinic -Follow-up in 3-6 months with PCP

## 2014-02-24 NOTE — Progress Notes (Signed)
INTERNAL MEDICINE TEACHING ATTENDING ADDENDUM - Aldine Contes, MD: I personally saw and evaluated Ms. Bick in this clinic visit in conjunction with the resident, Dr. Posey Pronto. I have discussed patient's plan of care with medical resident during this visit. I have confirmed the physical exam findings and have read and agree with the clinic note including the plan with the following addition: - Patient here for BP follow up - On exam- lungs- CTA b/l, cardio- RRR, normal heart sounds - BP stable off verapamil. Continue with other meds for now - Maintain BP log and follow up in 3-6 months

## 2014-03-30 ENCOUNTER — Telehealth: Payer: Self-pay | Admitting: *Deleted

## 2014-03-30 NOTE — Telephone Encounter (Signed)
[facility] for [facility]epam, per visit 7/6 pt rec'd refill of #30 w/ 5 refills, called [facility] and ask that they transfer script from [facility], they were agreeable

## 2014-04-01 ENCOUNTER — Encounter (HOSPITAL_COMMUNITY): Payer: Self-pay | Admitting: Emergency Medicine

## 2014-04-01 ENCOUNTER — Emergency Department (HOSPITAL_COMMUNITY)
Admission: EM | Admit: 2014-04-01 | Discharge: 2014-04-01 | Disposition: A | Discharge status: Home / Self Care | Payer: Medicare Other | Attending: Emergency Medicine | Admitting: Emergency Medicine

## 2014-04-01 DIAGNOSIS — E785 Hyperlipidemia, unspecified: Secondary | ICD-10-CM | POA: Diagnosis not present

## 2014-04-01 DIAGNOSIS — Z87891 Personal history of nicotine dependence: Secondary | ICD-10-CM | POA: Insufficient documentation

## 2014-04-01 DIAGNOSIS — Z79899 Other long term (current) drug therapy: Secondary | ICD-10-CM | POA: Diagnosis not present

## 2014-04-01 DIAGNOSIS — Z8739 Personal history of other diseases of the musculoskeletal system and connective tissue: Secondary | ICD-10-CM | POA: Diagnosis not present

## 2014-04-01 DIAGNOSIS — I10 Essential (primary) hypertension: Secondary | ICD-10-CM | POA: Diagnosis not present

## 2014-04-01 DIAGNOSIS — F411 Generalized anxiety disorder: Secondary | ICD-10-CM | POA: Insufficient documentation

## 2014-04-01 DIAGNOSIS — K219 Gastro-esophageal reflux disease without esophagitis: Secondary | ICD-10-CM | POA: Diagnosis not present

## 2014-04-01 DIAGNOSIS — B029 Zoster without complications: Secondary | ICD-10-CM | POA: Diagnosis not present

## 2014-04-01 DIAGNOSIS — Z7982 Long term (current) use of aspirin: Secondary | ICD-10-CM | POA: Insufficient documentation

## 2014-04-01 MED ORDER — ACYCLOVIR 200 MG PO CAPS: 800.0000 mg | ORAL_CAPSULE | Freq: Every day | ORAL | Status: DC

## 2014-04-01 MED ORDER — ACYCLOVIR 200 MG PO CAPS
800.0000 mg | ORAL_CAPSULE | Freq: Once | ORAL | Status: AC
  Administered 2014-04-01: 800 mg via ORAL
  Filled 2014-04-01: qty 4

## 2014-04-01 MED ORDER — HYDROCODONE-ACETAMINOPHEN 5-325 MG PO TABS: 1.0000 | ORAL_TABLET | ORAL | Status: DC | PRN

## 2014-04-01 NOTE — ED Provider Notes (Signed)
CSN: [id number]     Arrival date & time 04/01/14  2216 History   None    Chief Complaint  Patient presents with  . Herpes Zoster    The patient said she started having back and neck pain and put a heating pad.  The next morning she said she started having a rash on her forearm and it spread up her arm.     (Consider location/radiation/quality/duration/timing/severity/associated sxs/prior Treatment) HPI Comments: Stared hhaving R shoulder pain that ran down her arm on Monday yesterday noted an painful rash   The history is provided by the patient.    Past Medical History  Diagnosis Date  . History of melanoma     status post excision of lesion 2006, dermatology  re-eva 2011  . Intravenous bisphosphonates causing adverse effect in therapeutic use     Glu-like illness and electrolyte disturbance  . Anxiety   . Hyperlipidemia      statin intolerance severe, myopathy  . Hypertension   . Angioedema     secondary to niacin  . Hypercalcemia     possibloe association with HCTZ but patient wants to continue diuretic  . Hypomagnesemia     secondary to bisphosphonate IV  . Raynaud's phenomenon     negative ANA, ESR  . Osteoporosis   . GERD (gastroesophageal reflux disease)    Past Surgical History  Procedure Laterality Date  . Abdominal hysterectomy  73 yo    partial hysterectomy - unclear cause, pt thinks possible fibroids.   Family History  Problem Relation Age of Onset  . Heart disease Mother   . Hypertension Mother   . Heart disease Father   . Cancer Brother 73    prostate and lung   History  Substance Use Topics  . Smoking status: Former Smoker -- 0.25 packs/day for 5 years    Types: Cigarettes    Quit date: 05/06/2010  . Smokeless tobacco: Never Used  . Alcohol Use: No   OB History   Grav Para Term Preterm Abortions TAB SAB Ect Mult Living                 Review of Systems  Constitutional: Negative for fever and chills.  Musculoskeletal: Negative for  arthralgias and neck pain.  Skin: Positive for rash.  Neurological: Negative for weakness and numbness.  All other systems reviewed and are negative.     Allergies  Niacin; Statins; Zoledronic acid; and Paxil  Home Medications   Prior to Admission medications   Medication Sig Start Date End Date Taking? Authorizing Provider  acyclovir (ZOVIRAX) 200 MG capsule Take 4 capsules (800 mg total) by mouth 5 (five) times daily. 04/01/14   Garald Balding, NP  aspirin 81 MG tablet Take 81 mg by mouth daily.      Historical Provider, MD  calcium-vitamin D (OSCAL WITH D 500-200) 500-200 MG-UNIT per tablet Take 1 tablet by mouth daily.      Historical Provider, MD  carvedilol (COREG) 12.5 MG tablet TAKE ONE TABLET BY MOUTH TWICE DAILY 08/28/13   Hester Mates, MD  Cholecalciferol (VITAMIN D-3 PO) Take 2 tablets by mouth daily.     Acquanetta Chain, DO  clonazePAM (KLONOPIN) 0.5 MG tablet Take 1 tablet (0.5 mg total) by mouth at bedtime as needed for anxiety. For anxiety. 02/08/14   Charlott Rakes, MD  famotidine (PEPCID) 20 MG tablet Take 20 mg by mouth 2 (two) times daily. Take in AM and PM  Acquanetta Chain, DO  hydrochlorothiazide (HYDRODIURIL) 25 MG tablet TAKE 1 TABLET BY MOUTH EVERY DAY    Hester Mates, MD  HYDROcodone-acetaminophen (NORCO/VICODIN) 5-325 MG per tablet Take 1 tablet by mouth every 4 (four) hours as needed. 04/01/14   Garald Balding, NP  lisinopril (PRINIVIL,ZESTRIL) 40 MG tablet Take 1 tablet (40 mg total) by mouth daily. 10/27/13   Hester Mates, MD  Multiple Vitamin (MULTIVITAMIN) tablet Take 1 tablet by mouth daily.      Historical Provider, MD  Omega 3-6-9 Fatty Acids (OMEGA-3 & OMEGA-6 FISH OIL PO) Take 1 tablet by mouth daily.      Historical Provider, MD  Wheat Dextrin (BENEFIBER) TABS Take 1 tablet by mouth 2 (two) times daily. Or as directed.     Historical Provider, MD   BP 175/75  Pulse 78  Temp(Src) 98.5 F (36.9 C) (Oral)  Resp 16  SpO2 98% Physical Exam  Vitals  reviewed. Constitutional: She appears well-developed and well-nourished.  HENT:  Head: Normocephalic.  Eyes: Pupils are equal, round, and reactive to light.  Neck: Normal range of motion.  Cardiovascular: Normal rate and regular rhythm.   Pulmonary/Chest: Effort normal.  Abdominal: Soft.  Musculoskeletal: Normal range of motion. She exhibits no edema and no tenderness.  Neurological: She is alert.  Skin: Skin is warm and dry. Rash noted.       ED Course  Procedures (including critical care time) Labs Review Labs Reviewed - No data to display  Imaging Review No results found.   EKG Interpretation None      MDM   Final diagnoses:  Shingles   Patient has, shingles.  We'll start on acyclovir have patient followup with her primary care physician.  Next week      Garald Balding, NP 04/01/14 2252

## 2014-04-01 NOTE — Discharge Instructions (Signed)
Shingles Shingles is caused by the same virus that causes chickenpox. The first feelings may be pain or tingling. A rash will follow in a couple days. The rash may occur on any area of the body. Long-lasting pain is more likely in an elderly person. It can last months to years. There are medicines that can help prevent pain if you start taking them early. HOME CARE   Take cool baths or place cool cloths on the rash as told by your doctor.  Take medicine only as told by your doctor.  Rest as told by your doctor.  Keep your rash clean with mild soap and cool water or as told by your doctor.  Do not scratch your rash. You may use calamine lotion to relieve itchy skin as told by your doctor.  Keep your rash covered with a loose bandage (dressing).  Avoid touching:  Babies.  Pregnant women.  Children with inflamed skin (eczema).  People who have gotten organ transplants.  People with chronic illnesses, such as leukemia or AIDS.  Wear loose-fitting clothing.  If the rash is on the face, you may need to see a specialist. Keep all appointments. Shingles must be kept away from the eyes, if possible.  Keep all follow-up visits as told by your doctor. GET HELP RIGHT AWAY IF:   You have any pain on the face or eye.  You lose feeling on one side of your face.  You have ear pain or ringing in your ear.  You cannot taste as well.  Your medicines do not help the pain.  Your redness or puffiness (swelling) spreads.  You feel like you are getting worse.  You have a fever. MAKE SURE YOU:   Understand these instructions.  Will watch your condition.  Will get help right away if you are not doing well or get worse. Document Released: 01/09/2008 Document Revised: 12/07/2013 Document Reviewed: 01/09/2008 [facility] Patient Information 2015 [place], [place]. This information is not intended to replace advice given to you by your health care provider. Make sure you discuss any questions  you have with your health care provider. Take the medication as directed until all tablets taken

## 2014-04-01 NOTE — ED Notes (Signed)
Patient is alert and orientedx4.  Patient was explained discharge instructions and they understood them with no questions.   

## 2014-04-01 NOTE — ED Provider Notes (Signed)
Medical screening examination/treatment/procedure(s) were performed by non-physician practitioner and as supervising physician I was immediately available for consultation/collaboration.   EKG Interpretation None        Evelina Bucy, MD 04/01/14 2350

## 2014-04-01 NOTE — ED Notes (Signed)
The patient said she started having back and neck pain on Monday and put a heating pad.  Then yesterday morning she said she started having a rash on her forearm and it spread up her arm.  She is still in a lot of pain the radiates up to her shoulder.

## 2014-04-06 ENCOUNTER — Ambulatory Visit: Payer: Medicare Other | Admitting: Internal Medicine

## 2014-04-06 ENCOUNTER — Encounter: Payer: Self-pay | Admitting: Internal Medicine

## 2014-04-06 ENCOUNTER — Ambulatory Visit (INDEPENDENT_AMBULATORY_CARE_PROVIDER_SITE_OTHER): Payer: Medicare Other | Admitting: Internal Medicine

## 2014-04-06 VITALS — BP 154/68 | HR 69 | Temp 97.8°F | Wt 113.2 lb

## 2014-04-06 DIAGNOSIS — H9319 Tinnitus, unspecified ear: Secondary | ICD-10-CM | POA: Diagnosis not present

## 2014-04-06 DIAGNOSIS — H9311 Tinnitus, right ear: Secondary | ICD-10-CM | POA: Insufficient documentation

## 2014-04-06 DIAGNOSIS — B029 Zoster without complications: Secondary | ICD-10-CM

## 2014-04-06 DIAGNOSIS — I10 Essential (primary) hypertension: Secondary | ICD-10-CM | POA: Diagnosis not present

## 2014-04-06 MED ORDER — ACYCLOVIR 200 MG PO CAPS
200.0000 mg | ORAL_CAPSULE | Freq: Three times a day (TID) | ORAL | Status: AC
Start: 2014-04-06 — End: 2014-04-16

## 2014-04-06 NOTE — Assessment & Plan Note (Signed)
Patient complain of tinnitus in her right ear which started 3-4 days ago. She has no associated loss of hearing. The ringing does not disturb her sleep or cause headaches. Tinnitus is likely secondary to current Herpes Zoster infection in the C5-C6 distribution on the right. On physical exam, her hearing is the same bilaterally and there are no noted lesions or rash around her right ear or within the right ear canal. We will prescribe Acyclovir for treatment of Herpes Zoster infection and patient will follow up in 1 month. If patient continues to have symptomatic tinnitus, we can consider adding on a TCA. However, the risk versus benefits would need to be considered and discussed with the patient at this time.

## 2014-04-06 NOTE — Patient Instructions (Signed)
General Instructions:   Thank you for bringing your medicines today. This helps [place] keep you safe from mistakes.   Progress Toward Treatment Goals:  Treatment Goal 02/22/2014  Blood pressure at goal    Self Care Goals & Plans:  Self Care Goal 02/22/2014  Manage my medications take my medicines as prescribed  Eat healthy foods -  Be physically active -    No flowsheet data found.   Care Management & Community Referrals:  Referral 02/08/2014  Referrals made for care management support none needed      Please continue to take your Acyclovir 4 pills 5 times per day until you complete the bottle. You can use your medication log to track which doses you have already taken.

## 2014-04-06 NOTE — Progress Notes (Signed)
Subjective:     Patient ID: Elizabeth Woodward, female   DOB: Apr 15, 1941, 73 y.o.   MRN: [id number]  HPI Elizabeth Woodward is a 73 yo female with PMHx of HTN, Raynaud's syndrome, HLD, GERD and anxiety who presents today for follow up after an ED visit. Patient was diagnosed with shingles on 04/01/14. Patient states 2 weeks ago she developed pain and soreness in her right upper extremity. She tried heating pads but the pain did not subside. A few day later, she saw a rash appear from her shoulder down her right arm to her hand. The rash had pockets of fluid and was very painful. She states a few days ago she also developed a ringing in her right ear. She has never had a rash like this before and has never had the shingles vaccine due to cost. Patient was prescribed Acyclovir 200 mg -take 4 pills 5 times a day for 7 days and Norco 5/325 (10 pills total). After discussing the regimen with patient, I discovered she was only taking 4-5 pills per day instead of 20. She states she still has pain, but she is able to control it with Tylenol and an occasional Norco (she has 4 pills left). She does not request any other medication for pain control. Patient states she has been wearing long sleeves and avoiding contact with children and pregnant women.   Review of Systems General: Denies fever, chills, fatigue, change in appetite and diaphoresis.  Respiratory: Denies SOB, cough, DOE, chest tightness, and wheezing.   Cardiovascular: Denies chest pain and palpitations.  Gastrointestinal: Denies nausea, vomiting, abdominal pain, diarrhea, constipation, blood in stool and abdominal distention.  Genitourinary: Denies dysuria, urgency, frequency, hematuria, suprapubic pain and flank pain. Endocrine: Denies hot or cold intolerance, polyuria, and polydipsia. Musculoskeletal: Denies back pain, joint swelling, arthralgias and gait problem.  Skin: Admits to painful rash along her right hand and arm up to her right ear. Denies pallor and  wounds.  Neurological: Admits to tinnitus in right ear. Denies dizziness, headaches, weakness, lightheadedness, numbness,seizures, and syncope, Psychiatric/Behavioral: Denies mood changes, confusion, nervousness, sleep disturbance and agitation.     Objective:   Physical Exam Filed Vitals:   04/06/14 1442  BP: 154/68  Pulse: 69  Temp: 97.8 F (36.6 C)  TempSrc: Oral  Weight: 113 lb 3.2 oz (51.347 kg)  SpO2: 99%   General: Vital signs reviewed.  Patient is well-developed and well-nourished, in no acute distress and cooperative with exam.  Head: Normocephalic and atraumatic. Eyes: EOMI, conjunctivae normal, no scleral icterus.  Neck: Supple, trachea midline, normal ROM, no JVD, masses, thyromegaly, or carotid bruit present.  Cardiovascular: RRR, S1 normal, S2 normal, no murmurs, gallops, or rubs. Pulmonary/Chest: Clear to auscultation bilaterally, no wheezes, rales, or rhonchi. Abdominal: Soft, non-tender, non-distended, BS +, no masses, organomegaly, or guarding present.  Musculoskeletal: No joint deformities, erythema, or stiffness, ROM full and nontender. Extremities: No lower extremity edema bilaterally,  pulses symmetric and intact bilaterally. No cyanosis or clubbing. Neurological: A&O x3, Strength is normal and symmetric bilaterally, cranial nerve II-XII are grossly intact, no focal motor deficit, sensory intact to light touch bilaterally.  Skin: Erythematous rash extending from her right shoulder down to her right hand in the C5-C6 nerve distribution. Lesions are crusted over and there are no signs of vesicular lesions. There are NO lesions or rash along her neck, around her right ear or inside the right ear canal. Right arm is very tender to light touch. Psychiatric: Normal mood and  affect. speech and behavior is normal. Cognition and memory are normal.      Assessment:         Plan:

## 2014-04-06 NOTE — Assessment & Plan Note (Signed)
Patient presented after a 2 week history of painful, vesicular lesions on her right arm. Patient was seen in the ED for treatment and given Acyclovir 200 mg 4 pill 5 times a day for 7 days and Norco 5/325. Patient was only taking Acyclovir 200 mg 4-5 pills per day. On presentation, patient has erythematous rash in the C5-6 distribution on the right without vesicular lesions. Patient had taken 20 pills so far. I prescribed her 20 new pills to add to her remaining pills and she will complete a full course of Acyclovir 200 mg 4 pill 5 times a day for 7 days. I provided the patient with a chart so that she can record each dose she takes each day. Patient's pain is controlled with Tylenol and Norco. Patient will follow up in 1 month for recheck.

## 2014-04-14 NOTE — Progress Notes (Signed)
Internal Medicine Clinic Attending Date of visit: 04/06/2014  I saw and evaluated the patient.  I personally confirmed the key portions of the history and exam documented by Dr. Marvel Plan and I reviewed pertinent patient test results.  The assessment, diagnosis, and plan were formulated together and I agree with the documentation in the resident's note.

## 2014-05-13 ENCOUNTER — Ambulatory Visit: Payer: Medicare Other | Admitting: Internal Medicine

## 2014-05-18 ENCOUNTER — Encounter: Payer: Self-pay | Admitting: Internal Medicine

## 2014-05-18 ENCOUNTER — Ambulatory Visit (INDEPENDENT_AMBULATORY_CARE_PROVIDER_SITE_OTHER): Payer: Medicare Other | Admitting: Internal Medicine

## 2014-05-18 VITALS — BP 165/87 | HR 111 | Temp 97.8°F | Ht 59.0 in | Wt 110.6 lb

## 2014-05-18 DIAGNOSIS — I10 Essential (primary) hypertension: Secondary | ICD-10-CM

## 2014-05-18 DIAGNOSIS — B029 Zoster without complications: Secondary | ICD-10-CM | POA: Diagnosis not present

## 2014-05-18 MED ORDER — CARVEDILOL 25 MG PO TABS: 25.0000 mg | ORAL_TABLET | Freq: Two times a day (BID) | ORAL | Status: DC

## 2014-05-18 NOTE — Assessment & Plan Note (Signed)
Appears clinically resolved. Will consider Zostavax a few weeks from now.

## 2014-05-18 NOTE — Progress Notes (Signed)
Patient ID: Elizabeth Woodward, female   DOB: [birth year], 73 y.o.   MRN: [id number]   Subjective:   HPI: Elizabeth Woodward is a 73 y.o. woman with PMHx of HTN, Raynaud's syndrome, HLD, GERD and anxiety.  Reason(s) for this visit: 1. Hypertension follow up: Today's SBP >160 mm Hg x2. She is compliant with her medications, including Coreg 12.5 mg twice a day, hydrochlorothiazide, 25 mg daily, and lisinopril 40 mg daily. 2. Followup follow Herpes Zoster: Diagnosed with shingles on 04/01/2014 involving right C5-C6 dermatome. She has completed the treatment with acyclovir, and feels better.   ROS: Constitutional: Denies fever, chills, diaphoresis, appetite change and fatigue.  Respiratory: Denies SOB, DOE, cough, chest tightness, and wheezing. Denies chest pain. CVS: No chest pain, palpitations and leg swelling.  GI: No abdominal pain, nausea, vomiting, bloody stools GU: No dysuria, frequency, hematuria, or flank pain.  MSK: No myalgias, back pain, joint swelling, arthralgias  Psych: No depression symptoms. No SI or SA.    Objective:  Physical Exam: Filed Vitals:   05/18/14 1411 05/18/14 1454  BP: 164/75 165/87  Pulse: 85 111  Temp: 97.8 F (36.6 C)   TempSrc: Oral   Height: 4\' 11"  (1.499 m)   Weight: 110 lb 9.6 oz (50.168 kg)   SpO2: 99%    General: Well nourished. No acute distress.  HEENT: Normal oral mucosa. MMM.  Lungs: CTA bilaterally. Heart: RRR; no extra sounds or murmurs  Abdomen: Non-distended, normal bowel sounds, soft, nontender; no hepatosplenomegaly  Extremities: No pedal edema. No joint swelling or tenderness. Initial rash on the right upper extremity is not discernible to me.  Neurologic: Normal EOM,  Alert and oriented x3. No obvious neurologic/cranial nerve deficits.  Assessment & Plan:  Discussed case with my attending in the clinic, Dr. Dareen Piano. See problem based charting.

## 2014-05-18 NOTE — Assessment & Plan Note (Signed)
BP Readings from Last 3 Encounters:  05/18/14 165/87  04/06/14 154/68  04/01/14 175/75    Lab Results  Component Value Date   NA 136 07/08/2013   K 4.6 07/08/2013   CREATININE 0.99 07/08/2013    Assessment: Blood pressure control: moderately elevated Progress toward BP goal:  unchanged Comments: Compliant with Coreg 12.5 mg twice daily, HCTZ 25 mg daily, and Lisinopril 40 mg daily  Plan: Medications:  Increased Coreg to 25 mg twice a day. Continue with lisinopril 40 mg, and HCTZ, 25mg . Educational resources provided:   Self management tools provided:   Other plans: Followup in 2 weeks for BP check

## 2014-05-18 NOTE — Patient Instructions (Addendum)
General Instructions: We have increased your Carvediolol dose from 12.5 mg daily to 25 mg daily  Please take the rest of your medications as before  You may take Tylenol over the counter for arm pain  Please stop taking Advil  Please come back in 2 weeks to have a check up.  Thank you for bringing your medicines today. This helps [place] keep you safe from mistakes.   Progress Toward Treatment Goals:  Treatment Goal 05/18/2014  Blood pressure unchanged    Self Care Goals & Plans:  Self Care Goal 05/18/2014  Manage my medications take my medicines as prescribed; bring my medications to every visit; refill my medications on time  Eat healthy foods -  Be physically active -    No flowsheet data found.   Care Management & Community Referrals:  Referral 05/18/2014  Referrals made for care management support none needed

## 2014-05-19 NOTE — Progress Notes (Signed)
INTERNAL MEDICINE TEACHING ATTENDING ADDENDUM - Machele Deihl, MD: I reviewed and discussed at the time of visit with the resident Dr. Kazibwe, the patient's medical history, physical examination, diagnosis and results of pertinent tests and treatment and I agree with the patient's care as documented.  

## 2014-05-31 ENCOUNTER — Encounter: Payer: Self-pay | Admitting: Internal Medicine

## 2014-05-31 ENCOUNTER — Ambulatory Visit (INDEPENDENT_AMBULATORY_CARE_PROVIDER_SITE_OTHER): Payer: Medicare Other | Admitting: Internal Medicine

## 2014-05-31 VITALS — BP 112/63 | HR 76 | Temp 97.6°F | Wt 114.7 lb

## 2014-05-31 DIAGNOSIS — I10 Essential (primary) hypertension: Secondary | ICD-10-CM

## 2014-05-31 DIAGNOSIS — Z23 Encounter for immunization: Secondary | ICD-10-CM | POA: Diagnosis not present

## 2014-05-31 DIAGNOSIS — B029 Zoster without complications: Secondary | ICD-10-CM | POA: Diagnosis not present

## 2014-05-31 NOTE — Patient Instructions (Signed)
General Instructions: Continue with your medications as before  Please come back in 3 months to see your doctor  Thank you for bringing your medicines today. This helps [place] keep you safe from mistakes.   Progress Toward Treatment Goals:  Treatment Goal 05/31/2014  Blood pressure at goal    Self Care Goals & Plans:  Self Care Goal 05/31/2014  Manage my medications take my medicines as prescribed; bring my medications to every visit; refill my medications on time  Eat healthy foods -  Be physically active -    No flowsheet data found.   Care Management & Community Referrals:  Referral 05/31/2014  Referrals made for care management support none needed

## 2014-05-31 NOTE — Assessment & Plan Note (Signed)
BP Readings from Last 3 Encounters:  05/31/14 112/63  05/18/14 165/87  04/06/14 154/68    Lab Results  Component Value Date   NA 136 07/08/2013   K 4.6 07/08/2013   CREATININE 0.99 07/08/2013    Assessment: Blood pressure control: controlled Progress toward BP goal:  at goal Comments: Compliant with Coreg 25 mg twice daily, HCTZ 25 mg daily, and Lisinopril 40 mg daily  Plan: Medications:  continue current medications Educational resources provided:   Self management tools provided:   Other plans: Followup in 2-3 month with PCP

## 2014-05-31 NOTE — Progress Notes (Signed)
Patient ID: Elizabeth Woodward, female   DOB: Aug 23, 1940, 73 y.o.   MRN: [id number]   Subjective:   HPI: [name] is a 73 y.o. woman with PMHx of HTN, Raynaud's syndrome, HLD, GERD and anxiety.  Reason(s) for this visit:  HTN  Follow up. I saw patient on 10/13 where her BP was elevated. I increased her Coreg from 12.5 mg bid to 25 mg bid. She has been compliant with her medications.   ROS: Constitutional: Denies fever, chills, diaphoresis, appetite change and fatigue.  Respiratory: Denies SOB, DOE, cough, chest tightness, and wheezing. Denies chest pain. CVS: No chest pain, palpitations and leg swelling.  GI: No abdominal pain, nausea, vomiting, bloody stools GU: No dysuria, frequency, hematuria, or flank pain.  MSK: No myalgias, back pain, joint swelling, arthralgias  Psych: No depression symptoms. No SI or SA.    Objective:  Physical Exam: Filed Vitals:   05/31/14 1349 05/31/14 1414  BP: 154/74 112/63  Pulse: 85 76  Temp: 97.6 F (36.4 C)   TempSrc: Oral   Weight: 114 lb 11.2 oz (52.028 kg)   SpO2: 100%    General: Well nourished. No acute distress.  HEENT: Normal oral mucosa. MMM.  Lungs: CTA bilaterally. Heart: RRR; no extra sounds or murmurs  Abdomen: Non-distended, normal bowel sounds, soft, nontender; no hepatosplenomegaly  Extremities: No pedal edema. No joint swelling or tenderness.  Neurologic: Normal EOM,  Alert and oriented x3. No obvious neurologic/cranial nerve deficits.  Assessment & Plan:  Discussed case with my attending in the clinic, Dr. Eppie Gibson. See problem based charting.

## 2014-06-01 NOTE — Progress Notes (Signed)
Case discussed with Dr. Kazibwe soon after the resident saw the patient.  We reviewed the resident's history and exam and pertinent patient test results.  I agree with the assessment, diagnosis, and plan of care documented in the resident's note. 

## 2014-07-12 ENCOUNTER — Encounter: Payer: Self-pay | Admitting: *Deleted

## 2014-07-21 ENCOUNTER — Other Ambulatory Visit: Payer: Self-pay | Admitting: *Deleted

## 2014-07-21 DIAGNOSIS — I10 Essential (primary) hypertension: Secondary | ICD-10-CM

## 2014-07-21 MED ORDER — CARVEDILOL 25 MG PO TABS: 25.0000 mg | ORAL_TABLET | Freq: Two times a day (BID) | ORAL | Status: DC

## 2014-09-08 ENCOUNTER — Encounter: Payer: Medicare Other | Admitting: Internal Medicine

## 2014-09-10 ENCOUNTER — Telehealth: Payer: Self-pay | Admitting: Internal Medicine

## 2014-09-10 ENCOUNTER — Telehealth: Payer: Self-pay | Admitting: *Deleted

## 2014-09-10 NOTE — Telephone Encounter (Signed)
Call to patient to confirm appointment for 09/13/14 at 1:45. LMTCB

## 2014-09-10 NOTE — Telephone Encounter (Signed)
Pt called - having head congestion, white productive cough and denies temp at this time. Son got pt OTC med - has heart on box. Suggest for pt to call pharmacy to check. Suggest if no better to call Monday 09/13/14 AM for an appt. Elizabeth Woodward Baylee Mccorkel RN 09/10/14 3:55PM

## 2014-09-13 ENCOUNTER — Encounter: Payer: Medicare Other | Admitting: Internal Medicine

## 2014-09-27 ENCOUNTER — Other Ambulatory Visit: Payer: Self-pay | Admitting: Internal Medicine

## 2014-09-28 ENCOUNTER — Other Ambulatory Visit: Payer: Self-pay | Admitting: *Deleted

## 2014-09-28 MED ORDER — HYDROCHLOROTHIAZIDE 25 MG PO TABS: 25.0000 mg | ORAL_TABLET | Freq: Every day | ORAL | Status: DC

## 2014-09-29 ENCOUNTER — Other Ambulatory Visit: Payer: Self-pay | Admitting: Internal Medicine

## 2014-10-06 ENCOUNTER — Encounter: Payer: Self-pay | Admitting: Internal Medicine

## 2014-10-06 ENCOUNTER — Ambulatory Visit (INDEPENDENT_AMBULATORY_CARE_PROVIDER_SITE_OTHER): Payer: Medicare Other | Admitting: Internal Medicine

## 2014-10-06 VITALS — BP 173/77 | HR 58 | Temp 97.8°F | Resp 20 | Ht <= 58 in | Wt 116.0 lb

## 2014-10-06 DIAGNOSIS — F411 Generalized anxiety disorder: Secondary | ICD-10-CM

## 2014-10-06 DIAGNOSIS — I10 Essential (primary) hypertension: Secondary | ICD-10-CM

## 2014-10-06 MED ORDER — ALPRAZOLAM 0.5 MG PO TABS: ORAL_TABLET | ORAL | Status: DC

## 2014-10-06 NOTE — Assessment & Plan Note (Signed)
BP Readings from Last 3 Encounters:  10/06/14 173/77  05/31/14 112/63  05/18/14 165/87    Lab Results  Component Value Date   NA 136 07/08/2013   K 4.6 07/08/2013   CREATININE 0.99 07/08/2013    Assessment: Blood pressure control: moderately elevated Progress toward BP goal:  deteriorated Comments: BP elevated today which she ascribes to feeling anxious about the issues with her clonazepam and the script not filling.   Plan: Medications:  continue current medications coreg 25 mg bid, HCTZ 25 mg daily, lisinopril 40 mg daily Educational resources provided: brochure Self management tools provided: home blood pressure logbook Other plans: RTC end of month to assess anxiety and weaning off clonazepam and recheck BP at that time

## 2014-10-06 NOTE — Assessment & Plan Note (Signed)
Elizabeth Woodward has anxiety. She sent a request for clonazepam 0.5 mg dailyprn #30 which was supposed to be filled last week. I filled her prescription but she had an issue picking this up from [facility]. They said they did not have it. We had RN call today and they do have the prescription. She has not taken clonzepam in 6 days and says she feels anxious but better than she expected. She says she wants to be off the medication and asked for help discontinuing it. We have called her pharmacy to cancel the clonazepam and will give her xanax Rx as it is shorter acting than clonazepam. She is instructed to take xanax every other day as needed but not if needed. I will see her back at the end of the month and we will reassess if she can be off benzodiazepines permanently. The window for withdrawal can be up to 2 weeks so she will have xanax in meantime. She did not have any signs of withdraw other than feeling anxious today which she said improved during the course of our interview. -alprazolam 0.5 mg qodprn -reassess at end of month and consider discontinuing permanently

## 2014-10-06 NOTE — Progress Notes (Signed)
Internal Medicine Clinic Attending  Case discussed with Dr. Rothman at the time of the visit.  We reviewed the resident's history and exam and pertinent patient test results.  I agree with the assessment, diagnosis, and plan of care documented in the resident's note. 

## 2014-10-06 NOTE — Progress Notes (Signed)
   Subjective:    Patient ID: Elizabeth Woodward, female    DOB: 03/09/1941, 74 y.o.   MRN: [id number]  HPI  Ms Finan is a 74 year old woman with HTN, HL, GERD, anxiety presenting for routine follow-up. Please see problem-based charting assessment and plan note for further details of medical issues addressed at today's visit.   Review of Systems  Constitutional: Negative for fever, chills and diaphoresis.  Respiratory: Negative for cough and shortness of breath.   Cardiovascular: Negative for chest pain and palpitations.  Gastrointestinal: Negative for nausea, vomiting, abdominal pain, diarrhea and constipation.  Neurological: Negative for dizziness, tremors, weakness, numbness and headaches.  Psychiatric/Behavioral: Negative for decreased concentration and agitation. The patient is nervous/anxious.        Objective:   Physical Exam  Constitutional: She is oriented to person, place, and time. She appears well-developed and well-nourished. No distress.  HENT:  Head: Normocephalic and atraumatic.  Mouth/Throat: Oropharynx is clear and moist.  Eyes: EOM are normal. Pupils are equal, round, and reactive to light.  Cardiovascular: Normal rate, regular rhythm and intact distal pulses.  Exam reveals no gallop and no friction rub.   No murmur heard. Pulmonary/Chest: Effort normal and breath sounds normal. No respiratory distress. She has no wheezes.  Abdominal: Soft. Bowel sounds are normal. She exhibits no distension. There is no tenderness.  Neurological: She is alert and oriented to person, place, and time.  Skin: She is not diaphoretic.  Psychiatric: She has a normal mood and affect. Her behavior is normal.  Vitals reviewed.         Assessment & Plan:

## 2014-10-06 NOTE — Patient Instructions (Signed)
It was a pleasure to see you today. We are stopping your clonazepam (klonipin) and will be weaning you off. You may take one xanax pill every other day as needed for anxiety. Please return to clinic or seek medical attention if you have any new or worsening trouble breathing, hallucinations, or other worrisome medical condition. We look forward to seeing you again in one month.  Lottie Mussel, MD   General Instructions:   Thank you for bringing your medicines today. This helps [place] keep you safe from mistakes.   Progress Toward Treatment Goals:  Treatment Goal 10/06/2014  Blood pressure deteriorated    Self Care Goals & Plans:  Self Care Goal 10/06/2014  Manage my medications take my medicines as prescribed; bring my medications to every visit; refill my medications on time  Monitor my health bring my blood pressure log to each visit  Eat healthy foods eat baked foods instead of fried foods; eat foods that are low in salt; drink diet soda or water instead of juice or soda  Be physically active take a walk every day    No flowsheet data found.   Care Management & Community Referrals:  Referral 05/31/2014  Referrals made for care management support none needed

## 2014-10-24 ENCOUNTER — Other Ambulatory Visit: Payer: Self-pay | Admitting: Internal Medicine

## 2014-11-02 ENCOUNTER — Telehealth: Payer: Self-pay | Admitting: Internal Medicine

## 2014-11-02 NOTE — Telephone Encounter (Signed)
Call to patient to confirm appointment for 11/03/14 at 2:30. lmtcb

## 2014-11-03 ENCOUNTER — Ambulatory Visit (INDEPENDENT_AMBULATORY_CARE_PROVIDER_SITE_OTHER): Payer: Medicare Other | Admitting: Internal Medicine

## 2014-11-03 ENCOUNTER — Encounter: Payer: Self-pay | Admitting: Internal Medicine

## 2014-11-03 VITALS — BP 169/81 | HR 71 | Temp 97.6°F | Ht <= 58 in | Wt 114.2 lb

## 2014-11-03 DIAGNOSIS — F411 Generalized anxiety disorder: Secondary | ICD-10-CM

## 2014-11-03 DIAGNOSIS — I10 Essential (primary) hypertension: Secondary | ICD-10-CM | POA: Diagnosis not present

## 2014-11-03 MED ORDER — ALPRAZOLAM 0.5 MG PO TABS: ORAL_TABLET | ORAL | Status: DC

## 2014-11-03 NOTE — Progress Notes (Signed)
Case discussed with Dr. Rothman at time of visit. We reviewed the resident's history and exam and pertinent patient test results. I agree with the assessment, diagnosis, and plan of care documented in the resident's note. 

## 2014-11-03 NOTE — Patient Instructions (Addendum)
It was a pleasure to see you today. Congratulations on cutting back on the anxiety medicines. We will give you ten pills of xanax that you can take every three days as needed for anxiety. Do not take it if you do not feel like you need it. We will see you back in a month and please bring your blood pressure cuff at that time. Please return to clinic or seek medical attention if you have any new or worsening chest pain, trouble breathing, or other worrisome medical condition. We look forward to seeing you again in one month.  Lottie Mussel, MD  General Instructions:   Thank you for bringing your medicines today. This helps [place] keep you safe from mistakes.   Progress Toward Treatment Goals:  Treatment Goal 11/03/2014  Blood pressure unchanged    Self Care Goals & Plans:  Self Care Goal 11/03/2014  Manage my medications take my medicines as prescribed; bring my medications to every visit; refill my medications on time  Monitor my health keep track of my blood pressure  Eat healthy foods eat more vegetables; eat foods that are low in salt; eat baked foods instead of fried foods  Be physically active take a walk every day    No flowsheet data found.   Care Management & Community Referrals:  Referral 05/31/2014  Referrals made for care management support none needed

## 2014-11-03 NOTE — Progress Notes (Signed)
   Subjective:    Patient ID: Elizabeth Woodward, female    DOB: [birth year], 74 y.o.   MRN: [id number]  HPI  Elizabeth Woodward is a 74 year old woman with anxiety, HTN, HL, GERD here for follow-up on her HTN and anxiety. Please see problem-based charting assessment and plan note for further details of medical issues addressed at today's visit.   Review of Systems  Constitutional: Negative for fever, chills and diaphoresis.  Respiratory: Negative for shortness of breath.   Cardiovascular: Negative for chest pain.  Gastrointestinal: Negative for nausea, vomiting, abdominal pain, diarrhea and constipation.  Neurological: Negative for dizziness, weakness, light-headedness and numbness.       Objective:   Physical Exam  Constitutional: She is oriented to person, place, and time. She appears well-developed and well-nourished. No distress.  HENT:  Head: Normocephalic and atraumatic.  Mouth/Throat: Oropharynx is clear and moist.  Eyes: EOM are normal. Pupils are equal, round, and reactive to light.  Cardiovascular: Normal rate, regular rhythm, normal heart sounds and intact distal pulses.  Exam reveals no gallop and no friction rub.   No murmur heard. Pulmonary/Chest: Effort normal and breath sounds normal. No respiratory distress. She has no wheezes.  Abdominal: Soft. Bowel sounds are normal. She exhibits no distension. There is no tenderness.  Neurological: She is alert and oriented to person, place, and time.  Skin: She is not diaphoretic.  Vitals reviewed.         Assessment & Plan:

## 2014-11-03 NOTE — Assessment & Plan Note (Signed)
BP Readings from Last 3 Encounters:  11/03/14 169/81  10/06/14 173/77  05/31/14 112/63    Lab Results  Component Value Date   NA 136 07/08/2013   K 4.6 07/08/2013   CREATININE 0.99 07/08/2013    Assessment: Blood pressure control: mildly elevated Progress toward BP goal:  unchanged Comments: BP elevated again today at 169/81 (was 173/77 at last visit earlier this month). She brought home BP log and today it shows generally in the 100s-140s/50s-80s. She could have "white coat HTN" as it is he systolic that is elevated and she has anxiety. She is on coreg 25 mg bid, HCTZ 25 mg daily, and lisinopril 40 mg daily  Plan: Medications:  continue current medications Educational resources provided:   Self management tools provided: home blood pressure logbook Other plans: bring BP cuff for calibration when return in one month for anxiety assessment

## 2014-11-03 NOTE — Assessment & Plan Note (Addendum)
At last visit on 3/2, we stopped her clonazepam 0.5mg prn as she wants to wean off of it and switched to shorter acting xanax which was prescribed as xanax 0.5 mg qodprn #15 with no refills. Since our last visit, she feels better. She has been taking it every other day. I reminded her she did not have to take it if she didn't feel anxious and she said I told her she could take it every two days so that is what she is doing. She does still note some anxiety. She thinks she still feels anxious if she is around a crowd of people. She has one xanax pill left so she has appropriately been taking it qod. She does not want to take any medicine. Given her personality, I think a slow wean off the benzodiazepene would have best chance of success -xanax 0.5 mg every three days as needed #10, no refills -RTC in one month for further weaning if successful

## 2014-12-02 ENCOUNTER — Encounter: Payer: Self-pay | Admitting: *Deleted

## 2014-12-02 ENCOUNTER — Telehealth: Payer: Self-pay | Admitting: Internal Medicine

## 2014-12-02 NOTE — Telephone Encounter (Signed)
Call to patient to confirm appointment for 12/03/14 at 2:15 lmtcb

## 2014-12-03 ENCOUNTER — Ambulatory Visit (INDEPENDENT_AMBULATORY_CARE_PROVIDER_SITE_OTHER): Payer: Medicare Other | Admitting: Internal Medicine

## 2014-12-03 ENCOUNTER — Encounter: Payer: Self-pay | Admitting: Internal Medicine

## 2014-12-03 VITALS — BP 168/77 | HR 87 | Temp 98.2°F | Wt 114.5 lb

## 2014-12-03 DIAGNOSIS — D518 Other vitamin B12 deficiency anemias: Secondary | ICD-10-CM | POA: Diagnosis not present

## 2014-12-03 DIAGNOSIS — E785 Hyperlipidemia, unspecified: Secondary | ICD-10-CM | POA: Diagnosis not present

## 2014-12-03 DIAGNOSIS — M8580 Other specified disorders of bone density and structure, unspecified site: Secondary | ICD-10-CM

## 2014-12-03 DIAGNOSIS — I10 Essential (primary) hypertension: Secondary | ICD-10-CM | POA: Diagnosis not present

## 2014-12-03 DIAGNOSIS — F411 Generalized anxiety disorder: Secondary | ICD-10-CM | POA: Diagnosis not present

## 2014-12-03 DIAGNOSIS — J309 Allergic rhinitis, unspecified: Secondary | ICD-10-CM

## 2014-12-03 DIAGNOSIS — D519 Vitamin B12 deficiency anemia, unspecified: Secondary | ICD-10-CM

## 2014-12-03 DIAGNOSIS — Z0000 Encounter for general adult medical examination without abnormal findings: Secondary | ICD-10-CM

## 2014-12-03 DIAGNOSIS — K219 Gastro-esophageal reflux disease without esophagitis: Secondary | ICD-10-CM | POA: Diagnosis not present

## 2014-12-03 LAB — BASIC METABOLIC PANEL WITH GFR
BUN: 18 mg/dL (ref 6–23)
CO2: 22 mEq/L (ref 19–32)
Calcium: 9.6 mg/dL (ref 8.4–10.5)
Chloride: 98 mEq/L (ref 96–112)
Creat: 0.94 mg/dL (ref 0.50–1.10)
GFR, Est African American: 70 mL/min
GFR, Est Non African American: 60 mL/min
Glucose, Bld: 97 mg/dL (ref 70–99)
Potassium: 4.7 mEq/L (ref 3.5–5.3)
Sodium: 131 mEq/L — ABNORMAL LOW (ref 135–145)

## 2014-12-03 LAB — CBC
HCT: 33.6 % — ABNORMAL LOW (ref 36.0–46.0)
Hemoglobin: 11.3 g/dL — ABNORMAL LOW (ref 12.0–15.0)
MCH: 30.2 pg (ref 26.0–34.0)
MCHC: 33.6 g/dL (ref 30.0–36.0)
MCV: 89.8 fL (ref 78.0–100.0)
MPV: 9.5 fL (ref 8.6–12.4)
Platelets: 323 10*3/uL (ref 150–400)
RBC: 3.74 MIL/uL — ABNORMAL LOW (ref 3.87–5.11)
RDW: 14.6 % (ref 11.5–15.5)
WBC: 8.4 10*3/uL (ref 4.0–10.5)

## 2014-12-03 MED ORDER — HYDROCHLOROTHIAZIDE 25 MG PO TABS: 12.5000 mg | ORAL_TABLET | Freq: Every day | ORAL | Status: DC

## 2014-12-03 NOTE — Patient Instructions (Signed)
It was a pleasure seeing you today, Elizabeth Woodward.  - Decrease HCTZ to 12.5 mg daily - You have done a great job on getting off of your anxiety medications. Keep up the great work! - Please complete the stool cards before your next visit   General Instructions:   Thank you for bringing your medicines today. This helps [place] keep you safe from mistakes.   Progress Toward Treatment Goals:  Treatment Goal 11/03/2014  Blood pressure unchanged    Self Care Goals & Plans:  Self Care Goal 11/03/2014  Manage my medications take my medicines as prescribed; bring my medications to every visit; refill my medications on time  Monitor my health keep track of my blood pressure  Eat healthy foods eat more vegetables; eat foods that are low in salt; eat baked foods instead of fried foods  Be physically active take a walk every day    No flowsheet data found.   Care Management & Community Referrals:  Referral 05/31/2014  Referrals made for care management support none needed

## 2014-12-05 NOTE — Assessment & Plan Note (Signed)
Continue Pepcid  

## 2014-12-05 NOTE — Assessment & Plan Note (Signed)
-   Patient refused colonoscopy, but agreed to do stool cards and bring back to her next visit in 3 months - Refused mammogram

## 2014-12-05 NOTE — Progress Notes (Signed)
   Subjective:    Patient ID: Elizabeth Woodward, female    DOB: [birth year], 74 y.o.   MRN: [id number]  HPI Elizabeth Woodward is a 74yo woman with PMHx of HTN, GERD, allergic rhinitis, hyperlipidemia, and anxiety who presents today for the following:  HTN: BP 168/77 today. However, her home BP log shows systolic BPs consistently in the 80s-100s with only 1 or 2 readings in the 140s. She brought her home BP cuff and it was accurate with our office cuff. She currently takes Coreg 25 mg BID, HCTZ 25 mg daily, and Lisinopril 40 mg daily. She notes dizziness, especially when her systolic BP is in the 40J or low 90s.   Anxiety: Patient was successfully weaned off of clonazepam after being on this medication for more than 8 years. She is currently taking Xanax 0.5 mg every 3 days as needed and trying to be weaned off of this medication as well. She reports she has only taken 3 pills this month and would like to try to completely stop taking Xanax. She was previously noted to have anxiety while in large groups, but today reports she is able to go to shopping/grocery store without any difficulties.     Review of Systems General: Denies fever, chills, night sweats, changes in weight, changes in appetite HEENT: Denies headaches, ear pain, changes in vision, rhinorrhea, sore throat CV: Denies CP, palpitations, SOB, orthopnea Pulm: Denies SOB, cough, wheezing GI: Denies abdominal pain, nausea, vomiting, diarrhea, constipation, melena, hematochezia GU: Denies dysuria, hematuria, frequency Msk: Denies muscle cramps, joint pains Neuro: Denies weakness, numbness, tingling Skin: Denies rashes, bruising    Objective:   Physical Exam Orthostatics negative   General: appears younger than stated age, sitting up in chair, NAD HEENT: /AT, EOMI, sclera anicteric, mucus membranes moist CV: RRR, no m/g/r  Pulm: CTA bilaterally, moving normal volumes of air Abd: BS+, soft, non-tender, non-distended Ext: warm, no  edema, moves all  Neuro: alert and oriented x 3, no focal deficits     Assessment & Plan:  Please refer to A&P documentation.

## 2014-12-05 NOTE — Assessment & Plan Note (Addendum)
Patient has significant allergy to statins, noting hives and angioedema. Will not check lipid panel as patient refuses all treatments for hyperlipidemia.  - Continue omega 3 fatty acids

## 2014-12-05 NOTE — Assessment & Plan Note (Addendum)
BP Readings from Last 3 Encounters:  12/03/14 168/77  11/03/14 169/81  10/06/14 173/77    Lab Results  Component Value Date   NA 131* 12/03/2014   K 4.7 12/03/2014   CREATININE 0.94 12/03/2014    Assessment: Blood pressure control: mildly elevated Progress toward BP goal:  unchanged Comments: BP elevated here in office, but systolic BPs at home are very low with some in 80-90s. Likely that she has "white coat" hypertension. I am concerned since she is having dizziness with such low pressures and since has has a hx of osteopenia. Orthostatics negative. Will titrate down BP meds to prevent falls.   Plan: Medications:  Decrease HCTZ to 12.5 mg daily. Continue Coreg 25 mg BID and Lisinopril 40 mg daily.  Self management tools provided: home blood pressure logbook, instructions for home blood pressure monitoring Other plans: - BP recheck at next visit - Pt instructed to check BP once weekly and then to check daily for 1 week prior to next visit  - bmet today

## 2014-12-05 NOTE — Assessment & Plan Note (Signed)
Patient denies any current symptoms.

## 2014-12-05 NOTE — Assessment & Plan Note (Signed)
Check CBC today>> Hbg 11.3, at baseline

## 2014-12-05 NOTE — Assessment & Plan Note (Signed)
Successfully weaned off clonazepam and making great progress weaning off of Xanax. Instructed patient to continue Xanax 0.5 mg every 3 days AS NEEDED. She states she does not want me to refill her Xanax prescription in hopes to completely stop.

## 2014-12-05 NOTE — Assessment & Plan Note (Addendum)
Patient has hx of not tolerating IV bisphosphonates. I discussed starting PO bisphosphonates with the patient given her history of osteopenia and the risk of hip fracture if she were to have a fall, but she refused stating she does not want to take other medications. Will not proceed with DEXA scan as patient is unwilling to take bisphosphonates.  - Continue calcium and Vitamin D supplementation

## 2014-12-06 NOTE — Progress Notes (Signed)
Internal Medicine Clinic Attending  Case discussed with Dr. Rivet at the time of the visit.  We reviewed the resident's history and exam and pertinent patient test results.  I agree with the assessment, diagnosis, and plan of care documented in the resident's note.  

## 2015-01-22 ENCOUNTER — Other Ambulatory Visit: Payer: Self-pay | Admitting: Internal Medicine

## 2015-03-22 ENCOUNTER — Ambulatory Visit (INDEPENDENT_AMBULATORY_CARE_PROVIDER_SITE_OTHER): Payer: Medicare Other | Admitting: Internal Medicine

## 2015-03-22 ENCOUNTER — Encounter: Payer: Self-pay | Admitting: Internal Medicine

## 2015-03-22 VITALS — BP 180/72 | HR 86 | Temp 98.6°F | Ht <= 58 in | Wt 115.5 lb

## 2015-03-22 DIAGNOSIS — B9689 Other specified bacterial agents as the cause of diseases classified elsewhere: Secondary | ICD-10-CM | POA: Diagnosis not present

## 2015-03-22 DIAGNOSIS — F411 Generalized anxiety disorder: Secondary | ICD-10-CM | POA: Diagnosis not present

## 2015-03-22 DIAGNOSIS — N764 Abscess of vulva: Secondary | ICD-10-CM

## 2015-03-22 DIAGNOSIS — N762 Acute vulvitis: Secondary | ICD-10-CM | POA: Insufficient documentation

## 2015-03-22 DIAGNOSIS — I10 Essential (primary) hypertension: Secondary | ICD-10-CM | POA: Diagnosis not present

## 2015-03-22 MED ORDER — DOXYCYCLINE HYCLATE 50 MG PO CAPS: 50.0000 mg | ORAL_CAPSULE | Freq: Two times a day (BID) | ORAL | Status: DC

## 2015-03-22 NOTE — Assessment & Plan Note (Addendum)
Patient reports she had a "boil" present for several months on the top portion of her vulva. She states the boil then became painful over the last month. She denies any drainage, foul odor, vaginal discharge, or itching. She states she has not been sexually active since 2002. On exam, there appears to be inflammation in the top portion of the vulva, similar to a cellulitis. No foul odor or vaginal discharge present. Will treat as a skin infection with Doxycycline 100 mg BID for 7 days. Patient instructed to call the clinic if her symptoms do not resolve after completion of the antibiotics. Can consider doing wet prep if symptoms do not improve.

## 2015-03-22 NOTE — Progress Notes (Signed)
   Subjective:    Patient ID: Colleen Can, female    DOB: January 12, 1941, 74 y.o.   MRN: [id number]  HPI Ms. Pita is a 74yo woman with PMHx of HTN, GERD, hyperlipidemia, and anxiety who presents today for a routine visit.   Please refer to A&P documentation.    Review of Systems General: Denies fever, chills, night sweats, changes in weight, changes in appetite HEENT: Denies headaches, ear pain, changes in vision, rhinorrhea, sore throat CV: Denies CP, palpitations, SOB, orthopnea Pulm: Denies SOB, cough, wheezing GI: Denies abdominal pain, nausea, vomiting, diarrhea, constipation, melena, hematochezia GU: Denies dysuria, hematuria, frequency Msk: Denies muscle cramps, joint pains Neuro: Denies weakness, numbness, tingling Skin: Denies bruising    Objective:   Physical Exam General: alert, sitting up in chair, NAD HEENT: [place]/AT, EOMI, sclera anicteric, mucus membranes moist CV: RRR, no m/g/r Pulm: CTA bilaterally, breaths non-labored Abd: BS+, soft, non-tender GU: There is erythema and inflammation present at the top portion of the labia majora. Appears similar to a cellulitis. No vaginal discharge or foul odor present.  Ext: warm, 1+ bilateral peripheral edema Neuro: alert and oriented x 3, no focal deficits    Assessment & Plan:  Refer to A&P documentation.

## 2015-03-22 NOTE — Assessment & Plan Note (Signed)
BP Readings from Last 3 Encounters:  03/22/15 180/72  12/03/14 168/77  11/03/14 169/81    Lab Results  Component Value Date   NA 131* 12/03/2014   K 4.7 12/03/2014   CREATININE 0.94 12/03/2014    Assessment: Blood pressure control: moderately elevated Progress toward BP goal:  unable to assess Comments: BP elevated today. Patient has hx of white coat HTN. She did not bring her BP log with her today, but notes her BP has been in the 110s-130s at home. She has brought her BP cuff and BP log at prior visits and this is consistent.   Plan: Medications:  Continue Coreg 25 mg BID, HCTZ 12.5 mg daily, Lisinopril 40 mg daily Educational resources provided: brochure Self management tools provided: home blood pressure logbook Other plans:  - Instructed patient to bring BP log with her to next visit

## 2015-03-22 NOTE — Assessment & Plan Note (Signed)
Patient has successfully weaned herself off of Xanax. She has been under a lot of stress with multiple deaths of family and friends, but states she has been doing well without medication. She reports she stopped taking Xanax soon after our last visit. She appears to be coping with her anxiety well.

## 2015-03-22 NOTE — Patient Instructions (Signed)
-   Take doxycycline 100 mg twice a day for 7 days - If the inflammation does not get better please call the clinic - Bring your blood pressure log to your next visit  General Instructions:   Thank you for bringing your medicines today. This helps [place] keep you safe from mistakes.   Progress Toward Treatment Goals:  Treatment Goal 12/03/2014  Blood pressure unchanged    Self Care Goals & Plans:  Self Care Goal 03/22/2015  Manage my medications take my medicines as prescribed; bring my medications to every visit; refill my medications on time  Monitor my health keep track of my blood glucose  Eat healthy foods drink diet soda or water instead of juice or soda; eat more vegetables; eat foods that are low in salt  Be physically active take a walk every day    No flowsheet data found.   Care Management & Community Referrals:  Referral 05/31/2014  Referrals made for care management support none needed

## 2015-03-23 NOTE — Progress Notes (Signed)
Internal Medicine Clinic Attending  Case discussed with Dr. Rivet soon after the resident saw the patient.  We reviewed the resident's history and exam and pertinent patient test results.  I agree with the assessment, diagnosis, and plan of care documented in the resident's note.  

## 2015-06-28 ENCOUNTER — Encounter: Payer: Self-pay | Admitting: Student

## 2015-07-18 ENCOUNTER — Other Ambulatory Visit: Payer: Self-pay | Admitting: Internal Medicine

## 2015-07-19 ENCOUNTER — Other Ambulatory Visit: Payer: Self-pay | Admitting: Internal Medicine

## 2016-01-10 ENCOUNTER — Other Ambulatory Visit: Payer: Self-pay | Admitting: Internal Medicine

## 2016-01-24 ENCOUNTER — Encounter: Payer: Self-pay | Admitting: Internal Medicine

## 2016-01-24 ENCOUNTER — Ambulatory Visit (INDEPENDENT_AMBULATORY_CARE_PROVIDER_SITE_OTHER): Payer: Medicare Other | Admitting: Internal Medicine

## 2016-01-24 VITALS — BP 172/75 | HR 82 | Temp 98.4°F | Wt 117.7 lb

## 2016-01-24 DIAGNOSIS — L989 Disorder of the skin and subcutaneous tissue, unspecified: Secondary | ICD-10-CM

## 2016-01-24 DIAGNOSIS — E785 Hyperlipidemia, unspecified: Secondary | ICD-10-CM | POA: Diagnosis not present

## 2016-01-24 DIAGNOSIS — R202 Paresthesia of skin: Secondary | ICD-10-CM | POA: Diagnosis not present

## 2016-01-24 DIAGNOSIS — D649 Anemia, unspecified: Secondary | ICD-10-CM | POA: Diagnosis not present

## 2016-01-24 DIAGNOSIS — I10 Essential (primary) hypertension: Secondary | ICD-10-CM | POA: Diagnosis not present

## 2016-01-24 DIAGNOSIS — M7989 Other specified soft tissue disorders: Secondary | ICD-10-CM | POA: Diagnosis not present

## 2016-01-24 DIAGNOSIS — Z0000 Encounter for general adult medical examination without abnormal findings: Secondary | ICD-10-CM

## 2016-01-24 DIAGNOSIS — K219 Gastro-esophageal reflux disease without esophagitis: Secondary | ICD-10-CM

## 2016-01-24 DIAGNOSIS — M25461 Effusion, right knee: Secondary | ICD-10-CM

## 2016-01-24 MED ORDER — CARVEDILOL 25 MG PO TABS: ORAL_TABLET | ORAL | Status: DC

## 2016-01-24 MED ORDER — HYDROCHLOROTHIAZIDE 25 MG PO TABS: 12.5000 mg | ORAL_TABLET | Freq: Every day | ORAL | Status: DC

## 2016-01-24 NOTE — Progress Notes (Signed)
   Subjective:    Patient ID: Elizabeth Woodward, female    DOB: [birth year], 75 y.o.   MRN: [id number]  HPI Elizabeth Woodward is a 75yo woman with PMHx of HTN, hyperlipidemia, and osteopenia who presents today for follow up of her hypertension.  HTN: BP elevated today at 172/75. She does have a hx of "white coat HTN." Reports her BPs at home have been in the [id number] systolic. She takes Coreg 25 mg BID, HCTZ 12.5 mg daily, and Lisinopril 40 mg daily.   GERD: Reports she is taking Pepcid 20 mg BID which alleviates her symptoms.   Hyperlipidemia: Reports she is no longer taking fish oil.   Swelling of Bilateral Arms and Right Lower Extremity: Reports for the last few months she has noticed swelling in both of her upper extremities and her right lower extremity. She states for her RLE the swelling is mostly in the thigh. She feels that her pants fit tighter on this side. She denies any pain or redness in her arms or leg. She denies any chest pain, SOB, palpitations, dizziness, or near syncope.   Tingling in Right Foot: Reports for the last 2-3 months she has had tingling in her right foot. She has tried taking Tylenol 325 mg BID which seems to alleviate the discomfort. Denies any tingling or numbness in her left foot. Denies any color changes in her foot. Denies any positional changes or weakness. States the tingling does not last long and is infrequent. Denies any association with shoes or tight-fitting socks.    Review of Systems General: Denies fever, chills, night sweats, changes in weight, changes in appetite HEENT: Denies headaches, ear pain, changes in vision, rhinorrhea, sore throat CV: Denies orthopnea Pulm: Denies cough, wheezing GI: Denies abdominal pain, nausea, vomiting, diarrhea, constipation, melena, hematochezia GU: Denies dysuria, hematuria, frequency Msk: Denies muscle cramps Neuro: See HPI Skin: Denies rashes, bruising Psych: Denies depression, anxiety, hallucinations      Objective:   Physical Exam General: alert, sitting up, NAD HEENT: [place]/AT, EOMI, sclera anicteric, mucus membranes moist CV: RRR, no m/g/r Pulm: CTA bilaterally, breaths non-labored Abd: BS+, soft, non-tender Ext: No swelling appreciated in the upper extremities or right thigh. She had mild swelling of her left knee but this does not extend. No tenderness to palpation of entire leg. No calf tenderness. Distal pulses 2+ bilaterally. No color changes.  Neuro: alert and oriented x 3. Mildly decreased sensation to pinprick from right ankle down to foot compared to left side and on palmar aspect of right foot.  Skin: There is a 3-4 mm soft, non-pigmented, circular lesion on her upper left shoulder. Lesion is not painful. Varicose veins present on both lower extremities.     Assessment & Plan:  Please refer to A&P documentation.

## 2016-01-24 NOTE — Patient Instructions (Signed)
General Instructions: - Start taking Acetaminophen 325 mg 2 pills up to three times daily for your foot pain - Please complete your stool cards and bring them back - Continue your blood pressure medications as you have been doing - Referral to Dermatology for your back lesion. The office will call you to schedule an appointment. - Lab work today  Thank you for bringing your medicines today. This helps [place] keep you safe from mistakes.   Progress Toward Treatment Goals:  Treatment Goal 03/22/2015  Blood pressure unable to assess    Self Care Goals & Plans:  Self Care Goal 01/24/2016  Manage my medications take my medicines as prescribed; bring my medications to every visit; refill my medications on time  Monitor my health keep track of my blood pressure  Eat healthy foods eat more vegetables; eat foods that are low in salt  Be physically active find an activity I enjoy    No flowsheet data found.   Care Management & Community Referrals:  Referral 05/31/2014  Referrals made for care management support none needed

## 2016-01-25 ENCOUNTER — Other Ambulatory Visit: Payer: Self-pay | Admitting: Internal Medicine

## 2016-01-25 ENCOUNTER — Telehealth: Payer: Self-pay | Admitting: Internal Medicine

## 2016-01-25 LAB — BMP8+ANION GAP
Anion Gap: 17 mmol/L (ref 10.0–18.0)
BUN/Creatinine Ratio: 19 (ref 12–28)
BUN: 22 mg/dL (ref 8–27)
CO2: 24 mmol/L (ref 18–29)
Calcium: 10.4 mg/dL — ABNORMAL HIGH (ref 8.7–10.3)
Chloride: 88 mmol/L — ABNORMAL LOW (ref 96–106)
Creatinine, Ser: 1.16 mg/dL — ABNORMAL HIGH (ref 0.57–1.00)
GFR calc Af Amer: 54 mL/min/{1.73_m2} — ABNORMAL LOW (ref >59)
GFR calc non Af Amer: 46 mL/min/{1.73_m2} — ABNORMAL LOW (ref >59)
Glucose: 106 mg/dL — ABNORMAL HIGH (ref 65–99)
Potassium: 4.3 mmol/L (ref 3.5–5.2)
Sodium: 129 mmol/L — ABNORMAL LOW (ref 134–144)

## 2016-01-25 LAB — FERRITIN: Ferritin: 195 ng/mL — ABNORMAL HIGH (ref 15–150)

## 2016-01-25 LAB — CBC
Hematocrit: 33.5 % — ABNORMAL LOW (ref 34.0–46.6)
Hemoglobin: 11.1 g/dL (ref 11.1–15.9)
MCH: 30.2 pg (ref 26.6–33.0)
MCHC: 33.1 g/dL (ref 31.5–35.7)
MCV: 91 fL (ref 79–97)
Platelets: 357 10*3/uL (ref 150–379)
RBC: 3.68 x10E6/uL — ABNORMAL LOW (ref 3.77–5.28)
RDW: 13.6 % (ref 12.3–15.4)
WBC: 8.3 10*3/uL (ref 3.4–10.8)

## 2016-01-25 NOTE — Telephone Encounter (Signed)
carvedilol (COREG) 25 MG tablet  was sent to incorrect pharmacy needs to go to [place]

## 2016-01-25 NOTE — Telephone Encounter (Signed)
Called carvedilol to [facility]

## 2016-01-26 DIAGNOSIS — M170 Bilateral primary osteoarthritis of knee: Secondary | ICD-10-CM | POA: Insufficient documentation

## 2016-01-26 DIAGNOSIS — L989 Disorder of the skin and subcutaneous tissue, unspecified: Secondary | ICD-10-CM | POA: Insufficient documentation

## 2016-01-26 DIAGNOSIS — D649 Anemia, unspecified: Secondary | ICD-10-CM | POA: Insufficient documentation

## 2016-01-26 DIAGNOSIS — R202 Paresthesia of skin: Secondary | ICD-10-CM | POA: Insufficient documentation

## 2016-01-26 NOTE — Assessment & Plan Note (Signed)
She has no swelling of her upper extremities that I can appreciate nor swelling in her right leg other than her right knee. It is most likely related to OA. There is no swelling or tenderness to palpation of the knee, making infection or an inflammatory condition less likely. Additionally, there is no swelling, erythema, or tenderness of her right lower leg so I highly doubt DVT. She is not tachycardic or hypoxic on exam. Recommended to continue Tylenol as needed if she has any pain. Will continue to monitor.

## 2016-01-26 NOTE — Assessment & Plan Note (Signed)
Appears to have anemia for quite some time. MCV normal, in the 80s-90s. Will check a CBC and ferritin.

## 2016-01-26 NOTE — Assessment & Plan Note (Signed)
BP elevated here in the office, but controlled at home. She brought her home logbook and all BPs are in good range. Will continue Coreg 25 mg BID, HCTZ 12.5 mg daily, and Lisinopril 40 mg daily.

## 2016-01-26 NOTE — Assessment & Plan Note (Signed)
No longer taking fish oil. Given her age and reluctance to take medications, I am okay with her not taking any anti-cholesterol medications. She has no hx of CAD, stroke, PAD.

## 2016-01-26 NOTE — Assessment & Plan Note (Signed)
Tingling is in her right foot. She did have some decreased sensation to pinprick on her right foot. She states it does not really bother her much and seems to be infrequent. I am not quite sure what is causing the sensation. Typically with diabetes or vitamin B12 deficiency would expect findings to be bilateral. Will continue to monitor for now. Advised her to return to clinic sooner if it starts to bother her more.

## 2016-01-26 NOTE — Assessment & Plan Note (Signed)
Unclear the etiology of the lesion. I had Dr. Lovena Le take a look at it and he does not think it is a cyst or malignant given the shape, texture, and no associated pain/discoloration. She had reported having a cyst in a similar area that she had lanced here in the clinic a few years ago. Will refer her to dermatology.

## 2016-01-26 NOTE — Assessment & Plan Note (Signed)
Gave stool cards today. She will bring them back prior to next visit.

## 2016-01-26 NOTE — Assessment & Plan Note (Signed)
Symptoms controlled. Continue Pepcid 20 mg BID.

## 2016-01-31 NOTE — Progress Notes (Signed)
Internal Medicine Clinic Attending  Case discussed with Dr. Rivet at the time of the visit.  We reviewed the resident's history and exam and pertinent patient test results.  I agree with the assessment, diagnosis, and plan of care documented in the resident's note.  

## 2016-02-15 DIAGNOSIS — X32XXXD Exposure to sunlight, subsequent encounter: Secondary | ICD-10-CM | POA: Diagnosis not present

## 2016-02-15 DIAGNOSIS — L570 Actinic keratosis: Secondary | ICD-10-CM | POA: Diagnosis not present

## 2016-02-15 DIAGNOSIS — L723 Sebaceous cyst: Secondary | ICD-10-CM | POA: Diagnosis not present

## 2016-02-15 DIAGNOSIS — D044 Carcinoma in situ of skin of scalp and neck: Secondary | ICD-10-CM | POA: Diagnosis not present

## 2016-02-24 DIAGNOSIS — D0439 Carcinoma in situ of skin of other parts of face: Secondary | ICD-10-CM | POA: Diagnosis not present

## 2016-03-09 DIAGNOSIS — Z85828 Personal history of other malignant neoplasm of skin: Secondary | ICD-10-CM | POA: Diagnosis not present

## 2016-03-09 DIAGNOSIS — Z08 Encounter for follow-up examination after completed treatment for malignant neoplasm: Secondary | ICD-10-CM | POA: Diagnosis not present

## 2016-04-10 DIAGNOSIS — Z85828 Personal history of other malignant neoplasm of skin: Secondary | ICD-10-CM | POA: Diagnosis not present

## 2016-04-10 DIAGNOSIS — Z08 Encounter for follow-up examination after completed treatment for malignant neoplasm: Secondary | ICD-10-CM | POA: Diagnosis not present

## 2016-04-10 DIAGNOSIS — L570 Actinic keratosis: Secondary | ICD-10-CM | POA: Diagnosis not present

## 2016-04-10 DIAGNOSIS — L723 Sebaceous cyst: Secondary | ICD-10-CM | POA: Diagnosis not present

## 2016-04-10 DIAGNOSIS — X32XXXD Exposure to sunlight, subsequent encounter: Secondary | ICD-10-CM | POA: Diagnosis not present

## 2016-07-05 ENCOUNTER — Other Ambulatory Visit: Payer: Self-pay | Admitting: Internal Medicine

## 2016-07-31 ENCOUNTER — Other Ambulatory Visit: Payer: Self-pay | Admitting: Internal Medicine

## 2016-10-22 ENCOUNTER — Telehealth: Payer: Self-pay | Admitting: Internal Medicine

## 2016-10-22 NOTE — Telephone Encounter (Signed)
APT. REMINDER CALL, LMTCB °

## 2016-10-22 NOTE — Progress Notes (Signed)
CC: Follow-up for hypertension  HPI:  [name] is a 76 y.o. woman with past medical history as noted below who presents today for follow-up of her hypertension.  HTN: BP today is 182/72. She has a history of whitecoat hypertension. She brought her home BP log and BPs range in the 676H-209O systolic and 70J-62E diastolic. She is taking Coreg 25 mg twice daily, HCTZ 12.5 mg daily, and lisinopril 40 mg daily.  GERD: Reports she typically takes Pepcid 20 mg twice daily but sometimes does not take this medication. She bases this decision on the foods that she eats. She states foods such as spaghetti give her reflux and she will take Pepcid twice daily in that situation.   Osteoporosis: She had a DEXA scan in 2010 which showed a T score of -2.4. She had a severe adverse reaction to IV bisphosphonates. I had discussed taking PO bisphosphonates with the patient previously but she refused, stating she did not want to take another medication. She is currently taking calcium and vitamin D supplementation. She still does not wish to take PO bisphosphonates. We discussed the risk of fractures if she were to have a fall.   Osteoarthritis in Knees: She reports occasional pain in her bilateral knees. Pain occurs after walking for prolonged periods of time and sometimes at rest. Patient denies any particular pattern. She reports taking Tylenol 250 mg 1-2 times daily which relieves her pain. She denies any difficulties in her day to day activities.   Normocytic Anemia: Hx of normocytic anemia with Hgb in 11 range. Her Hgb was last checked in June 2017 and was 11.1. She denies any melena or hematochezia currently. She completed stool cards in 2014 that were negative. She reports having a colonoscopy 20+ years ago that showed hemorrhoids.   Anxiety: Reports she has been tolerating her anxiety well without medications. She typically goes for a walk or watches TV to relieve her anxiety. Denies any depressive  symptoms.  Past Medical History:  Diagnosis Date  . Angioedema    secondary to niacin  . Anxiety   . GERD (gastroesophageal reflux disease)   . History of melanoma    status post excision of lesion 2006, dermatology  re-eva 2011  . Hypercalcemia    possibloe association with HCTZ but patient wants to continue diuretic  . Hyperlipidemia     statin intolerance severe, myopathy  . Hypertension   . Hypomagnesemia    secondary to bisphosphonate IV  . Intravenous bisphosphonates causing adverse effect in therapeutic use    Glu-like illness and electrolyte disturbance  . Osteoporosis   . Raynaud's phenomenon    negative ANA, ESR    Review of Systems:   General: Denies fever, chills, night sweats, changes in weight, changes in appetite HEENT: Denies headaches, ear pain, changes in vision, rhinorrhea, sore throat CV: Denies CP, palpitations, SOB, orthopnea Pulm: Denies SOB, cough, wheezing GI: Denies abdominal pain, nausea, vomiting, diarrhea, constipation GU: Denies dysuria, hematuria, frequency Msk: Denies muscle cramps Neuro: Denies weakness, numbness, tingling Skin: Denies rashes, bruising Psych: Denies depression, hallucinations  Physical Exam:  Vitals:   10/23/16 1320  BP: (!) 182/72  Pulse: 82  Temp: 97.6 F (36.4 C)  TempSrc: Oral  SpO2: 100%  Weight: 116 lb 3.2 oz (52.7 kg)  Height: '4\' 10"'  (1.473 m)   General: Well-nourished elderly woman sitting up, NAD HEENT: [place]/AT, EOMI, sclera anicteric, mucus membranes moist CV: RRR, no m/g/r Pulm: CTA bilaterally, breaths non-labored Abd: BS+, soft,  non-tender Ext: warm, mild swelling at ankles Neuro: alert and oriented x 3, strength intact  Assessment & Plan:   See Encounters Tab for problem based charting.  Patient discussed with Dr. Angelia Mould

## 2016-10-23 ENCOUNTER — Ambulatory Visit (INDEPENDENT_AMBULATORY_CARE_PROVIDER_SITE_OTHER): Payer: Medicare Other | Admitting: Internal Medicine

## 2016-10-23 ENCOUNTER — Encounter: Payer: Self-pay | Admitting: Internal Medicine

## 2016-10-23 VITALS — BP 179/78 | HR 64 | Temp 97.6°F | Ht <= 58 in | Wt 116.2 lb

## 2016-10-23 DIAGNOSIS — Z1211 Encounter for screening for malignant neoplasm of colon: Secondary | ICD-10-CM

## 2016-10-23 DIAGNOSIS — Z79899 Other long term (current) drug therapy: Secondary | ICD-10-CM

## 2016-10-23 DIAGNOSIS — I10 Essential (primary) hypertension: Secondary | ICD-10-CM

## 2016-10-23 DIAGNOSIS — M810 Age-related osteoporosis without current pathological fracture: Secondary | ICD-10-CM | POA: Diagnosis not present

## 2016-10-23 DIAGNOSIS — F411 Generalized anxiety disorder: Secondary | ICD-10-CM

## 2016-10-23 DIAGNOSIS — M170 Bilateral primary osteoarthritis of knee: Secondary | ICD-10-CM | POA: Diagnosis not present

## 2016-10-23 DIAGNOSIS — Z87891 Personal history of nicotine dependence: Secondary | ICD-10-CM

## 2016-10-23 DIAGNOSIS — Z0000 Encounter for general adult medical examination without abnormal findings: Secondary | ICD-10-CM

## 2016-10-23 DIAGNOSIS — D649 Anemia, unspecified: Secondary | ICD-10-CM | POA: Diagnosis not present

## 2016-10-23 DIAGNOSIS — K219 Gastro-esophageal reflux disease without esophagitis: Secondary | ICD-10-CM | POA: Diagnosis not present

## 2016-10-23 NOTE — Assessment & Plan Note (Signed)
Denies any bleeding. We will check a CBC today.

## 2016-10-23 NOTE — Assessment & Plan Note (Signed)
Hx of osteoporosis diagnosed by DEXA in 2010. She did not tolerate IV bisphosphonates due to a severe adverse reaction. She does not wish to take PO bisphosphonates. We discussed the potential risk of fracture if she does have a fall. Advised to continue calcium and vitamin D supplementation.

## 2016-10-23 NOTE — Assessment & Plan Note (Signed)
Her anxiety is well-controlled while off medication. Encouraged her to continue walking and watching TV to help keep her anxiety under control.

## 2016-10-23 NOTE — Patient Instructions (Signed)
General Instructions: - Complete stool cards and mail in or bring to clinic lab - We will get some blood work today to check your electrolytes, kidney function, and blood counts - Check and record your blood pressures in the morning when you get up and bring to next visit - You can increase Tylenol if needed for your knee pain   Thank you for bringing your medicines today. This helps [place] keep you safe from mistakes.   Progress Toward Treatment Goals:  Treatment Goal 03/22/2015  Blood pressure unable to assess    Self Care Goals & Plans:  Self Care Goal 10/23/2016  Manage my medications take my medicines as prescribed; bring my medications to every visit; refill my medications on time  Monitor my health keep track of my blood pressure  Eat healthy foods eat more vegetables; eat foods that are low in salt; eat baked foods instead of fried foods  Be physically active find an activity I enjoy    No flowsheet data found.   Care Management & Community Referrals:  Referral 05/31/2014  Referrals made for care management support none needed

## 2016-10-23 NOTE — Assessment & Plan Note (Signed)
Reports occasional pain in both knees. Advised her to take Tylenol since this relieved her pain and that she can increase the dose up to 3000 mg daily as needed.

## 2016-10-23 NOTE — Assessment & Plan Note (Signed)
Well controlled on Pepcid 20 mg 1-2 times daily.

## 2016-10-23 NOTE — Assessment & Plan Note (Signed)
Given stool cards today. Patient to return cards once completed.

## 2016-10-23 NOTE — Assessment & Plan Note (Signed)
BP well controlled at home. She has a well known hx of white coat hypertension. Will continue her current regimen of Coreg 25 mg BID, HCTZ 12.5 mg daily, and Lisinopril 40 mg daily. Advised her to check her BPs in the morning (has been checking in the evenings) and bring log to next visit.

## 2016-10-24 LAB — BMP8+ANION GAP
Anion Gap: 19 mmol/L — ABNORMAL HIGH (ref 10.0–18.0)
BUN/Creatinine Ratio: 20 (ref 12–28)
BUN: 18 mg/dL (ref 8–27)
CO2: 24 mmol/L (ref 18–29)
Calcium: 10.1 mg/dL (ref 8.7–10.3)
Chloride: 86 mmol/L — ABNORMAL LOW (ref 96–106)
Creatinine, Ser: 0.88 mg/dL (ref 0.57–1.00)
GFR calc Af Amer: 74 mL/min/{1.73_m2} (ref >59)
GFR calc non Af Amer: 64 mL/min/{1.73_m2} (ref >59)
Glucose: 90 mg/dL (ref 65–99)
Potassium: 4.2 mmol/L (ref 3.5–5.2)
Sodium: 129 mmol/L — ABNORMAL LOW (ref 134–144)

## 2016-10-24 LAB — CBC
Hematocrit: 33.7 % — ABNORMAL LOW (ref 34.0–46.6)
Hemoglobin: 11.5 g/dL (ref 11.1–15.9)
MCH: 30.6 pg (ref 26.6–33.0)
MCHC: 34.1 g/dL (ref 31.5–35.7)
MCV: 90 fL (ref 79–97)
Platelets: 391 10*3/uL — ABNORMAL HIGH (ref 150–379)
RBC: 3.76 x10E6/uL — ABNORMAL LOW (ref 3.77–5.28)
RDW: 13.3 % (ref 12.3–15.4)
WBC: 8 10*3/uL (ref 3.4–10.8)

## 2016-10-25 ENCOUNTER — Telehealth: Payer: Self-pay | Admitting: Internal Medicine

## 2016-10-25 NOTE — Telephone Encounter (Signed)
Tried calling patient with lab results. She needs work up for her hyponatremia.

## 2016-10-29 NOTE — Progress Notes (Signed)
Internal Medicine Clinic Attending  Case discussed with Dr. Rivet soon after the resident saw the patient.  We reviewed the resident's history and exam and pertinent patient test results.  I agree with the assessment, diagnosis, and plan of care documented in the resident's note.  

## 2016-11-13 ENCOUNTER — Telehealth: Payer: Self-pay | Admitting: Internal Medicine

## 2016-11-13 NOTE — Telephone Encounter (Signed)
Left message for patient to call and schedule an appointment for her hyponatremia likely due to HCTZ medication. Can we please call her to schedule an appointment if she does not call in the next day or two? Thanks!

## 2016-11-20 ENCOUNTER — Encounter: Payer: Self-pay | Admitting: Internal Medicine

## 2016-11-20 ENCOUNTER — Ambulatory Visit (INDEPENDENT_AMBULATORY_CARE_PROVIDER_SITE_OTHER): Payer: Medicare Other | Admitting: Internal Medicine

## 2016-11-20 DIAGNOSIS — E871 Hypo-osmolality and hyponatremia: Secondary | ICD-10-CM

## 2016-11-20 DIAGNOSIS — Z79899 Other long term (current) drug therapy: Secondary | ICD-10-CM | POA: Diagnosis not present

## 2016-11-20 DIAGNOSIS — Z87891 Personal history of nicotine dependence: Secondary | ICD-10-CM

## 2016-11-20 DIAGNOSIS — I10 Essential (primary) hypertension: Secondary | ICD-10-CM

## 2016-11-20 NOTE — Assessment & Plan Note (Signed)
A few home BPs were elevated in the 140s-150s. Will have her stop HCTZ due to hyponatremia and continue Lisinopril 40 mg daily and Coreg 25 mg BID. She may need an additional antihypertensive since HCTZ is being stopped. Advised her to bring her BP log to her next visit and will determine if an additional agent such as Amlodipine should be started. Follow up in 2 months.

## 2016-11-20 NOTE — Assessment & Plan Note (Signed)
Her hyponatremia is most likely due to her HCTZ. She has had mild hyponatremia for several years and has been on HCTZ since 2005. Will have her discontinue HCTZ at this time and follow up in 2 months to recheck her sodium level. If her sodium level stays persistently low while off of HCTZ then will pursue further work up with serum osmolality, urine osmolality, and urine sodium. She does not appear volume overloaded on exam.

## 2016-11-20 NOTE — Patient Instructions (Signed)
General Instructions: - Stop HCTZ - Continue your Lisinopril and Coreg - Please bring your blood pressure log to your next visit as you always do - Follow up in 2 months. We will check your sodium level then.  Thank you for bringing your medicines today. This helps [place] keep you safe from mistakes.   Progress Toward Treatment Goals:  Treatment Goal 03/22/2015  Blood pressure unable to assess    Self Care Goals & Plans:  Self Care Goal 10/23/2016  Manage my medications take my medicines as prescribed; bring my medications to every visit; refill my medications on time  Monitor my health keep track of my blood pressure  Eat healthy foods eat more vegetables; eat foods that are low in salt; eat baked foods instead of fried foods  Be physically active find an activity I enjoy    No flowsheet data found.   Care Management & Community Referrals:  Referral 05/31/2014  Referrals made for care management support none needed

## 2016-11-20 NOTE — Progress Notes (Signed)
   CC: Follow up for hyponatremia  HPI:  [name] is a 76 y.o. woman with PMHx as noted below who presents today for follow up of hyponatremia.   Hyponatremia: Patient has had a low-normal sodium level since 2008. Most recently her sodium level has been stable at 131 in April 2016, 129 in June 2017, and 129 in March 2018. She is currently on HCTZ for blood pressure control. She reports feeling mild fatigue. She denies nausea, vomiting, dizziness, or muscle cramps. She denies any alcohol use. She reports eating 3 meals per day. She will typically eat cereal with milk and eggs for breakfast, sandwiches for lunch, and salad with protein or grilled vegetables with protein for dinner.   HTN: She has a known hx of white coat HTN. She is currently taking Lisinopril 40 mg daily, HCTZ 12.5 mg daily, and Coreg 25 mg BID. Her home BP log shows BPs in the 169-450T systolic with a few in the 888K systolic which she reports are taken before she takes her BP medications.    Past Medical History:  Diagnosis Date  . Angioedema    secondary to niacin  . Anxiety   . GERD (gastroesophageal reflux disease)   . History of melanoma    status post excision of lesion 2006, dermatology  re-eva 2011  . Hypercalcemia    possibloe association with HCTZ but patient wants to continue diuretic  . Hyperlipidemia     statin intolerance severe, myopathy  . Hypertension   . Hypomagnesemia    secondary to bisphosphonate IV  . Intravenous bisphosphonates causing adverse effect in therapeutic use    Glu-like illness and electrolyte disturbance  . Osteoporosis   . Raynaud's phenomenon    negative ANA, ESR    Review of Systems:   General: Denies fever, chills, night sweats, changes in weight, changes in appetite HEENT: Denies headaches, ear pain, changes in vision, rhinorrhea, sore throat CV: Denies CP, palpitations, SOB, orthopnea Pulm: Denies SOB, cough, wheezing GI: Denies abdominal pain, diarrhea,  constipation, melena, hematochezia GU: Denies dysuria, hematuria, frequency Msk: Denies muscle cramps, joint pains Neuro: Denies weakness, numbness, tingling Skin: Denies rashes, bruising Psych: Denies depression, anxiety, hallucinations  Physical Exam:  Vitals:   11/20/16 0947  BP: (!) 180/78  Pulse: 80  Temp: 97.9 F (36.6 C)  TempSrc: Oral  SpO2: 100%  Weight: 117 lb (53.1 kg)  Height: _0  (1.473 m)   General: Elderly woman in NAD CV: RRR, no m/g/r Pulm: CTA bilaterally, breaths non-labored  Ext: trace peripheral edema, unchanged  Assessment & Plan:   See Encounters Tab for problem based charting.  Patient discussed with Dr. Lynnae January

## 2016-11-21 NOTE — Progress Notes (Signed)
Internal Medicine Clinic Attending  Case discussed with Dr. Rivet at the time of the visit.  We reviewed the resident's history and exam and pertinent patient test results.  I agree with the assessment, diagnosis, and plan of care documented in the resident's note.  

## 2016-12-28 ENCOUNTER — Other Ambulatory Visit: Payer: Self-pay | Admitting: Internal Medicine

## 2017-01-15 ENCOUNTER — Encounter: Payer: Self-pay | Admitting: *Deleted

## 2017-01-15 ENCOUNTER — Encounter: Payer: Self-pay | Admitting: Internal Medicine

## 2017-01-15 ENCOUNTER — Ambulatory Visit (INDEPENDENT_AMBULATORY_CARE_PROVIDER_SITE_OTHER): Payer: Medicare Other | Admitting: Internal Medicine

## 2017-01-15 VITALS — BP 189/84 | HR 86 | Temp 98.2°F | Ht <= 58 in | Wt 118.4 lb

## 2017-01-15 DIAGNOSIS — E871 Hypo-osmolality and hyponatremia: Secondary | ICD-10-CM | POA: Diagnosis not present

## 2017-01-15 DIAGNOSIS — I10 Essential (primary) hypertension: Secondary | ICD-10-CM

## 2017-01-15 DIAGNOSIS — Z87891 Personal history of nicotine dependence: Secondary | ICD-10-CM | POA: Diagnosis not present

## 2017-01-15 DIAGNOSIS — Z0000 Encounter for general adult medical examination without abnormal findings: Secondary | ICD-10-CM

## 2017-01-15 NOTE — Patient Instructions (Signed)
General Instructions: - We will check your sodium level today and I will let you know if we need to do any further work up - Continue taking Lisinopril and Coreg for your blood pressure  Thank you for being such a wonderful patient. I wish you the best and will miss you! Dr. Arcelia Jew  Thank you for bringing your medicines today. This helps [place] keep you safe from mistakes.   Progress Toward Treatment Goals:  Treatment Goal 03/22/2015  Blood pressure unable to assess    Self Care Goals & Plans:  Self Care Goal 01/15/2017  Manage my medications take my medicines as prescribed; bring my medications to every visit; refill my medications on time  Monitor my health keep track of my blood pressure  Eat healthy foods eat foods that are low in salt; eat baked foods instead of fried foods  Be physically active find an activity I enjoy    No flowsheet data found.   Care Management & Community Referrals:  Referral 05/31/2014  Referrals made for care management support none needed

## 2017-01-15 NOTE — Progress Notes (Signed)
   CC: Follow up for hyponatremia  HPI:  [name] is a 76 y.o. woman with PMHx as noted below who presents today for follow up of hyponatremia.  Hyponatremia: Na 129 at last visit. Her Na level has been stable in the 128-130 range for many years. She was advised to stop her HCTZ at her last visit. She denies any headaches, nausea, dizziness, or lethargy.   HTN: BP elevated here at 189/84. She has a known hx of white-coat hypertension. She brought her home BP log today and BPs are in the 976-734 systolic range. She is taking Lisinopril 40 mg daily and Coreg 25 mg BID.   Past Medical History:  Diagnosis Date  . Angioedema    secondary to niacin  . Anxiety   . GERD (gastroesophageal reflux disease)   . History of melanoma    status post excision of lesion 2006, dermatology  re-eva 2011  . Hypercalcemia    possibloe association with HCTZ but patient wants to continue diuretic  . Hyperlipidemia     statin intolerance severe, myopathy  . Hypertension   . Hypomagnesemia    secondary to bisphosphonate IV  . Intravenous bisphosphonates causing adverse effect in therapeutic use    Glu-like illness and electrolyte disturbance  . Osteoporosis   . Raynaud's phenomenon    negative ANA, ESR    Review of Systems:   All negative except per HPI  Physical Exam:  Vitals:   01/15/17 1459  BP: (!) 189/84  Pulse: 86  Temp: 98.2 F (36.8 C)  TempSrc: Oral  SpO2: 98%  Weight: 118 lb 6.4 oz (53.7 kg)  Height: '4\' 10"'$  (1.473 m)   General: Elderly woman in NAD CV: RRR, no m/g/r Ext: no peripheral edema  Assessment & Plan:   See Encounters Tab for problem based charting.  Patient discussed with Dr. Daryll Drown

## 2017-01-16 LAB — BMP8+ANION GAP
Anion Gap: 17 mmol/L (ref 10.0–18.0)
BUN/Creatinine Ratio: 16 (ref 12–28)
BUN: 14 mg/dL (ref 8–27)
CO2: 26 mmol/L (ref 20–29)
Calcium: 9.9 mg/dL (ref 8.7–10.3)
Chloride: 96 mmol/L (ref 96–106)
Creatinine, Ser: 0.87 mg/dL (ref 0.57–1.00)
GFR calc Af Amer: 75 mL/min/{1.73_m2} (ref >59)
GFR calc non Af Amer: 65 mL/min/{1.73_m2} (ref >59)
Glucose: 87 mg/dL (ref 65–99)
Potassium: 3.9 mmol/L (ref 3.5–5.2)
Sodium: 139 mmol/L (ref 134–144)

## 2017-01-16 NOTE — Assessment & Plan Note (Signed)
Her hyponatremia is most likely secondary to diuretic use. She has stopped her HCTZ for 8 weeks now. Will recheck a bmet today to evaluate her sodium level. If sodium is not corrected, then will pursue further work up of her hyponatremia.  >> Na has improved to 139 since stopping HCTZ

## 2017-01-16 NOTE — Assessment & Plan Note (Signed)
Patient reports sending in stools cards. Will follow up results.

## 2017-01-16 NOTE — Assessment & Plan Note (Signed)
BPs well controlled at home. She has well known white coat hypertension. Will have her continue Lisinopril and Coreg. She was advised to continue bringing her BP log to every visit.

## 2017-01-17 ENCOUNTER — Telehealth: Payer: Self-pay | Admitting: *Deleted

## 2017-01-17 NOTE — Telephone Encounter (Signed)
-----   Message from Juliet Rude, MD sent at 01/16/2017  7:45 PM EDT ----- [name],  Could you please let Ms. Polack know that her sodium level is now completely normal? Thanks!  Carly

## 2017-01-17 NOTE — Telephone Encounter (Signed)
Pt called / informed "her sodium level is now completely normal" per Dr Arcelia Jew.

## 2017-01-21 NOTE — Progress Notes (Signed)
Internal Medicine Clinic Attending  Case discussed with Dr. Rivet at the time of the visit.  We reviewed the resident's history and exam and pertinent patient test results.  I agree with the assessment, diagnosis, and plan of care documented in the resident's note.  

## 2017-02-20 ENCOUNTER — Other Ambulatory Visit: Payer: Self-pay | Admitting: Internal Medicine

## 2017-02-20 ENCOUNTER — Other Ambulatory Visit: Payer: Self-pay | Admitting: *Deleted

## 2017-02-20 MED ORDER — CARVEDILOL 25 MG PO TABS: 25.0000 mg | ORAL_TABLET | Freq: Two times a day (BID) | ORAL | 3 refills | Status: DC

## 2017-02-20 NOTE — Telephone Encounter (Signed)
Refill request sent to pcp earlier today in a separate encounter.  Request pending review, will close this request and contact pt back once refill is approved.Despina Hidden Cassady7/18/201812:19 PM

## 2017-02-20 NOTE — Telephone Encounter (Signed)
Refill Request carvedilol (COREG) 25 MG tablet

## 2017-07-01 ENCOUNTER — Other Ambulatory Visit: Payer: Self-pay | Admitting: *Deleted

## 2017-07-01 MED ORDER — LISINOPRIL 40 MG PO TABS: 40.0000 mg | ORAL_TABLET | Freq: Every day | ORAL | 1 refills | Status: DC

## 2017-08-27 ENCOUNTER — Other Ambulatory Visit: Payer: Self-pay

## 2017-08-27 ENCOUNTER — Encounter: Payer: Self-pay | Admitting: Internal Medicine

## 2017-08-27 ENCOUNTER — Ambulatory Visit (INDEPENDENT_AMBULATORY_CARE_PROVIDER_SITE_OTHER): Payer: Medicare Other | Admitting: Internal Medicine

## 2017-08-27 VITALS — BP 205/94 | HR 81 | Temp 97.7°F | Ht <= 58 in | Wt 113.1 lb

## 2017-08-27 DIAGNOSIS — Z0000 Encounter for general adult medical examination without abnormal findings: Secondary | ICD-10-CM

## 2017-08-27 DIAGNOSIS — Z79899 Other long term (current) drug therapy: Secondary | ICD-10-CM | POA: Diagnosis not present

## 2017-08-27 DIAGNOSIS — M25561 Pain in right knee: Secondary | ICD-10-CM

## 2017-08-27 DIAGNOSIS — I10 Essential (primary) hypertension: Secondary | ICD-10-CM | POA: Diagnosis not present

## 2017-08-27 DIAGNOSIS — Z87891 Personal history of nicotine dependence: Secondary | ICD-10-CM | POA: Diagnosis not present

## 2017-08-27 DIAGNOSIS — M170 Bilateral primary osteoarthritis of knee: Secondary | ICD-10-CM | POA: Diagnosis not present

## 2017-08-27 MED ORDER — CARVEDILOL 25 MG PO TABS: 12.5000 mg | ORAL_TABLET | Freq: Two times a day (BID) | ORAL | 3 refills | Status: DC

## 2017-08-27 MED ORDER — AMLODIPINE BESYLATE 5 MG PO TABS: 5.0000 mg | ORAL_TABLET | Freq: Every day | ORAL | 1 refills | Status: DC

## 2017-08-27 NOTE — Progress Notes (Signed)
   CC: Follow up of hypertension   HPI:  [name] is a 77 y.o. F with a past medical history as described below who presents to the clinic for follow up of her HTN and chronic medical conditions.   HTN: Ms. Ground reports increased pain in her right knee which she has attributed to carvedilol as joint pain is a adverse effect listed for correct.  She decreased her Coreg from 25 twice daily to 25 once a day and her joint pain seems to have improved. She takes Tylenol occasionally for the pain which provides relief. She checks her blood pressure regularly and record the values in a log which she brought today.  The majority of values are in the 130s with occasional higher values of 140s.  There does not appear to be a significant change after the date and she indicated decreasing her carvedilol. She is otherwise doing well with no complaints   Past Medical History:  Diagnosis Date  . Angioedema    secondary to niacin  . Anxiety   . GERD (gastroesophageal reflux disease)   . History of melanoma    status post excision of lesion 2006, dermatology  re-eva 2011  . Hypercalcemia    possibloe association with HCTZ but patient wants to continue diuretic  . Hyperlipidemia     statin intolerance severe, myopathy  . Hypertension   . Hypomagnesemia    secondary to bisphosphonate IV  . Intravenous bisphosphonates causing adverse effect in therapeutic use    Glu-like illness and electrolyte disturbance  . Osteoporosis   . Raynaud's phenomenon    negative ANA, ESR   Review of Systems:  Review of Systems  Eyes: Negative for blurred vision and double vision.  Cardiovascular: Negative for chest pain.  Musculoskeletal: Positive for joint pain.  Neurological: Negative for dizziness and headaches.     Physical Exam:  Vitals:   08/27/17 1528 08/27/17 1544  BP: (!) 208/103 (!) 205/94  Pulse: 99 81  Temp: 97.7 F (36.5 C)   TempSrc: Oral   SpO2: 99%   Weight: 113 lb 1.6 oz (51.3 kg)    Height: '4\' 10"'$  (1.473 m)    General: Elderly female resting in chair comfortably, no acute distress HEENT: Normal conjuctiva, PERRL  CV: Regular rate and rhythm  Resp: CTAB, normal work of breathing, no distress  Extr: No LE edema, no crepitus, full ROM to bilateral knees, mild TTP of L knee primarily over patella  Neuro: Alert and oriented x3  Skin: Warm, dry      Assessment & Plan:   See Encounters Tab for problem based charting.  Patient discussed with Dr. Daryll Drown

## 2017-08-27 NOTE — Patient Instructions (Addendum)
Nice to meet you today [name].   For your blood pressure medicines, we will do the Coreg just 12.5 mg twice a day like you were before having the joint pain issue. We will also add a medicine called Amlodipine 5 mg daily to help with your blood pressure (this is also on the $4 list) and then can maybe take off the coreg altogether at your next visit. Keep doing a great job keeping track of your blood pressures at home. Continue taking Tylenol for any pain in your joints

## 2017-08-28 NOTE — Assessment & Plan Note (Signed)
She declined flu vaccination today. She has also declined repeat mammograms in the past after having them performed at regular intervals until around 2013, will re-visit this discussion.

## 2017-08-28 NOTE — Assessment & Plan Note (Signed)
As previously documented, she presents with an elevated blood pressure up to 208/103, however her home blood pressure log shows the majority of her blood pressures in systolics of 768G and occasional values in 140s.  She states she has previously had her blood pressure cuff examined in the clinic and it was consistent with readings here.  She has decreased her carvedilol dosing to 25 once a day due to perceived side effects of joint pain.  On imaging several years ago it does appear she has degenerative changes consistent with osteoarthritis in her knees.  Although this may be the underlying cause of her knee pain, carvedilol may be exacerbating this.  We will decrease the dosing to 12.5 twice a day and also add amlodipine 5 mg.  At follow-up, we will plan to discontinue carvedilol if her home blood pressure logs continue to indicate her blood pressure is well controlled.  --Carvedilol 12.5 mg BID, Amlodipine 5 mg daily, Lisinopril 40 mg daily

## 2017-08-29 NOTE — Progress Notes (Signed)
Internal Medicine Clinic Attending  Case discussed with Dr. Harden at the time of the visit.  We reviewed the resident's history and exam and pertinent patient test results.  I agree with the assessment, diagnosis, and plan of care documented in the resident's note.  

## 2017-11-26 ENCOUNTER — Other Ambulatory Visit: Payer: Self-pay

## 2017-11-26 ENCOUNTER — Ambulatory Visit (INDEPENDENT_AMBULATORY_CARE_PROVIDER_SITE_OTHER): Payer: Medicare Other | Admitting: Internal Medicine

## 2017-11-26 ENCOUNTER — Encounter (INDEPENDENT_AMBULATORY_CARE_PROVIDER_SITE_OTHER): Payer: Self-pay

## 2017-11-26 VITALS — BP 147/80 | HR 93 | Temp 98.2°F | Ht <= 58 in | Wt 114.0 lb

## 2017-11-26 DIAGNOSIS — Z0000 Encounter for general adult medical examination without abnormal findings: Secondary | ICD-10-CM

## 2017-11-26 DIAGNOSIS — I10 Essential (primary) hypertension: Secondary | ICD-10-CM | POA: Diagnosis not present

## 2017-11-26 DIAGNOSIS — K219 Gastro-esophageal reflux disease without esophagitis: Secondary | ICD-10-CM | POA: Diagnosis not present

## 2017-11-26 DIAGNOSIS — Z87891 Personal history of nicotine dependence: Secondary | ICD-10-CM | POA: Diagnosis not present

## 2017-11-26 DIAGNOSIS — R42 Dizziness and giddiness: Secondary | ICD-10-CM | POA: Diagnosis not present

## 2017-11-26 DIAGNOSIS — M170 Bilateral primary osteoarthritis of knee: Secondary | ICD-10-CM | POA: Diagnosis not present

## 2017-11-26 DIAGNOSIS — Z79899 Other long term (current) drug therapy: Secondary | ICD-10-CM | POA: Diagnosis not present

## 2017-11-26 DIAGNOSIS — M810 Age-related osteoporosis without current pathological fracture: Secondary | ICD-10-CM | POA: Diagnosis not present

## 2017-11-26 MED ORDER — AMLODIPINE BESYLATE 10 MG PO TABS: 10.0000 mg | ORAL_TABLET | Freq: Every day | ORAL | 3 refills | Status: DC

## 2017-11-26 MED ORDER — LISINOPRIL 40 MG PO TABS: 40.0000 mg | ORAL_TABLET | Freq: Every day | ORAL | 1 refills | Status: DC

## 2017-11-26 NOTE — Progress Notes (Signed)
   CC: HTN follow up   HPI:  [name] is a 77 y.o. F with a past medical history of HTN, OA to bilateral knees, GERD, osteoporosis who presents to the clinic for follow up of her hypertension.   HTN: She again has a log of home blood pressures which she takes regularly.  Since her last visit, she reports less joint pain which she previously attributed to carvedilol.  She does note several instances of lightheadedness upon standing which resolves with laying down.  Lowest blood pressure readings have been 98, 99 systolic on 2 occasions.  OA: She reports her joint pain is overall well controlled with taking Tylenol as needed.  She does note flares of her pain every 2 to 3 months that resolved with increasing Tylenol frequency.  Past Medical History:  Diagnosis Date  . Angioedema    secondary to niacin  . Anxiety   . GERD (gastroesophageal reflux disease)   . History of melanoma    status post excision of lesion 2006, dermatology  re-eva 2011  . Hypercalcemia    possibloe association with HCTZ but patient wants to continue diuretic  . Hyperlipidemia     statin intolerance severe, myopathy  . Hypertension   . Hypomagnesemia    secondary to bisphosphonate IV  . Intravenous bisphosphonates causing adverse effect in therapeutic use    Glu-like illness and electrolyte disturbance  . Osteoporosis   . Raynaud's phenomenon    negative ANA, ESR   Review of Systems:  Review of Systems  Respiratory: Negative for shortness of breath.   Cardiovascular: Negative for orthopnea.  Musculoskeletal: Negative for falls.  Neurological: Positive for dizziness. Negative for loss of consciousness and headaches.     Physical Exam:  Vitals:   11/26/17 1628 11/26/17 1638  BP: (!) 167/75 (!) 147/80  Pulse: (!) 109 93  Temp: 98.2 F (36.8 C)   TempSrc: Oral   SpO2: 98%   Weight: 114 lb (51.7 kg)   Height:  _0  (1.473 m)   General: Elderly female sitting in chair comfortably, no  acute distress CV: RRR Resp: Clear breath sounds bilaterally, normal work of breathing, no distress  Extr: No LE edema, normal gait  Neuro: Alert and oriented x3 Skin: Warm, dry    Assessment & Plan:   See Encounters Tab for problem based charting.  Patient discussed with Dr. Eppie Gibson

## 2017-11-26 NOTE — Patient Instructions (Addendum)
Good to see you again Elizabeth Woodward.   For your blood pressure, we stopped the carvedilol as this may have been causing your leg pains and dizziness.   Instead, take amlodipine 10 mg in the mornings, and Lisinopril 40 mg in the evenings. These should be a good combination to keep your blood pressure in a normal range while not affecting your joints and not causing dizziness. If you are still getting dizzy, give [place] a call and we can take another look.   For tomorrow morning only, take your last two of the 5 mg pills before switching to the 10 mg after picking them up.

## 2017-12-02 ENCOUNTER — Encounter: Payer: Self-pay | Admitting: Internal Medicine

## 2017-12-02 NOTE — Assessment & Plan Note (Signed)
Discussed with patient today, and confirmed that she had appropriate risk/benefit discussion with prior provider and has decided not to undergo mammogram, colonoscopy going forward.  Would benefit from Prevnar vaccination at follow-up

## 2017-12-02 NOTE — Progress Notes (Signed)
Case discussed with Dr. Johny Chess at the time of the visit. We reviewed the resident's history and exam and pertinent patient test results. I agree with the assessment, diagnosis, and plan of care documented in the resident's note.

## 2017-12-02 NOTE — Assessment & Plan Note (Signed)
Knee pain is occasional and well-controlled with Tylenol taken when needed.  Frequency of pain has decreased since reduction and now discontinuation of carvedilol

## 2017-12-02 NOTE — Assessment & Plan Note (Addendum)
She again presents with an elevated blood pressure here at 167/75, repeat improved to 147/80 with home blood pressure values on patient recorded log much better with majority of values in the 110s-120s (highest value 280 systolic).  This is consistent with previous presentations of the clinic, she has previously had her blood pressure cuff examined/calibrated in the clinic to ensure accuracy.  At last visit, carvedilol dosing was decreased due to potential side effects.  We will discontinue Coreg as her blood pressures have overall been controlled but will increase amlodipine to 10 mg to account for this and hopefully provide extra benefit of minimizing high values associated with anxiety, such as these values seen in clinic.  She was also advised to take lisinopril at night, amlodipine in the morning.  --Discontinue carvedilol, increase amlodipine to 10 mg daily --Continue lisinopril 40

## 2017-12-27 ENCOUNTER — Other Ambulatory Visit: Payer: Self-pay | Admitting: Internal Medicine

## 2017-12-27 MED ORDER — LISINOPRIL 40 MG PO TABS: 40.0000 mg | ORAL_TABLET | Freq: Every day | ORAL | 3 refills | Status: DC

## 2018-02-24 ENCOUNTER — Encounter: Payer: Self-pay | Admitting: *Deleted

## 2018-06-10 ENCOUNTER — Ambulatory Visit (INDEPENDENT_AMBULATORY_CARE_PROVIDER_SITE_OTHER): Payer: Medicare Other | Admitting: Internal Medicine

## 2018-06-10 ENCOUNTER — Other Ambulatory Visit: Payer: Self-pay

## 2018-06-10 ENCOUNTER — Encounter: Payer: Self-pay | Admitting: Internal Medicine

## 2018-06-10 VITALS — BP 169/76 | HR 136 | Temp 97.9°F | Ht 59.0 in | Wt 110.6 lb

## 2018-06-10 DIAGNOSIS — K644 Residual hemorrhoidal skin tags: Secondary | ICD-10-CM | POA: Diagnosis not present

## 2018-06-10 DIAGNOSIS — F419 Anxiety disorder, unspecified: Secondary | ICD-10-CM | POA: Diagnosis not present

## 2018-06-10 DIAGNOSIS — D649 Anemia, unspecified: Secondary | ICD-10-CM

## 2018-06-10 DIAGNOSIS — Z87891 Personal history of nicotine dependence: Secondary | ICD-10-CM

## 2018-06-10 DIAGNOSIS — Z79899 Other long term (current) drug therapy: Secondary | ICD-10-CM

## 2018-06-10 DIAGNOSIS — Z23 Encounter for immunization: Secondary | ICD-10-CM

## 2018-06-10 DIAGNOSIS — E785 Hyperlipidemia, unspecified: Secondary | ICD-10-CM

## 2018-06-10 DIAGNOSIS — I10 Essential (primary) hypertension: Secondary | ICD-10-CM | POA: Diagnosis not present

## 2018-06-10 DIAGNOSIS — K219 Gastro-esophageal reflux disease without esophagitis: Secondary | ICD-10-CM

## 2018-06-10 DIAGNOSIS — M810 Age-related osteoporosis without current pathological fracture: Secondary | ICD-10-CM

## 2018-06-10 DIAGNOSIS — R002 Palpitations: Secondary | ICD-10-CM

## 2018-06-10 DIAGNOSIS — K5909 Other constipation: Secondary | ICD-10-CM | POA: Diagnosis not present

## 2018-06-10 DIAGNOSIS — N8110 Cystocele, unspecified: Secondary | ICD-10-CM

## 2018-06-10 MED ORDER — POLYETHYLENE GLYCOL 3350 17 G PO PACK: 17.0000 g | PACK | Freq: Every day | ORAL | 0 refills | Status: AC | PRN

## 2018-06-10 NOTE — Patient Instructions (Signed)
Elizabeth Woodward  Thank you for coming in to the clinic. Here are the recommendations based on what we discussed:  - Please continue to take your medications as prescribed - I will give you a call and inform you of your blood tests results when they come - Please bring your blood pressure machine for your next visit. - We will see you back in 6 months.  Thank you for choosing [facility]

## 2018-06-11 LAB — BMP8+ANION GAP
Anion Gap: 18 mmol/L (ref 10.0–18.0)
BUN/Creatinine Ratio: 14 (ref 12–28)
BUN: 15 mg/dL (ref 8–27)
CO2: 21 mmol/L (ref 20–29)
Calcium: 11.2 mg/dL — ABNORMAL HIGH (ref 8.7–10.3)
Chloride: 96 mmol/L (ref 96–106)
Creatinine, Ser: 1.04 mg/dL — ABNORMAL HIGH (ref 0.57–1.00)
GFR calc Af Amer: 60 mL/min/{1.73_m2} (ref >59)
GFR calc non Af Amer: 52 mL/min/{1.73_m2} — ABNORMAL LOW (ref >59)
Glucose: 117 mg/dL — ABNORMAL HIGH (ref 65–99)
Potassium: 4.1 mmol/L (ref 3.5–5.2)
Sodium: 135 mmol/L (ref 134–144)

## 2018-06-11 LAB — CBC
Hematocrit: 35.1 % (ref 34.0–46.6)
Hemoglobin: 12 g/dL (ref 11.1–15.9)
MCH: 29.8 pg (ref 26.6–33.0)
MCHC: 34.2 g/dL (ref 31.5–35.7)
MCV: 87 fL (ref 79–97)
Platelets: 406 10*3/uL (ref 150–450)
RBC: 4.03 x10E6/uL (ref 3.77–5.28)
RDW: 12.7 % (ref 12.3–15.4)
WBC: 10.8 10*3/uL (ref 3.4–10.8)

## 2018-06-14 ENCOUNTER — Encounter: Payer: Self-pay | Admitting: Internal Medicine

## 2018-06-14 DIAGNOSIS — N8110 Cystocele, unspecified: Secondary | ICD-10-CM | POA: Insufficient documentation

## 2018-06-14 DIAGNOSIS — Z23 Encounter for immunization: Secondary | ICD-10-CM | POA: Insufficient documentation

## 2018-06-14 DIAGNOSIS — K5909 Other constipation: Secondary | ICD-10-CM | POA: Insufficient documentation

## 2018-06-14 MED ORDER — LISINOPRIL 40 MG PO TABS: 40.0000 mg | ORAL_TABLET | Freq: Every day | ORAL | 3 refills | Status: DC

## 2018-06-14 MED ORDER — AMLODIPINE BESYLATE 10 MG PO TABS: 10.0000 mg | ORAL_TABLET | Freq: Every day | ORAL | 3 refills | Status: DC

## 2018-06-14 NOTE — Assessment & Plan Note (Signed)
Presenting with complaints of 'vaginal mass' and groin fullness and discomfort of gradual onset for last couple years. She states she needed some time to build up courage to discuss the issue with a physician as she has high anxiety when seeing new providers. Denies any dysuria, pruritus, urinary incontinence, vaginal discharge. No recent sexual history. Gyn history significant for 2 children born via C-sections. Physical exam reveals vaginal wall prolapse with increased abdominal pressure. Suspected age-related vaginal wall prolapse w or w/o uterine prolapse.  - Discussed option of referral for ob/gyn but patient states she is reluctant to see another new provider and defer the referral at this time.  - Counseled on kegal exercises

## 2018-06-14 NOTE — Progress Notes (Signed)
Patient ID: Colleen Can, female   DOB: 01/14/1941, 77 y.o.   MRN: [id number]  I saw and evaluated the patient. I personally confirmed the key portions of Dr. Marguerita Beards history and exam and reviewed pertinent patient test results. The assessment, diagnosis, and plan were formulated together and I agree with the documentation in the resident's note.  Given the hypercalcemia we will discontinue the calcium-vitamin D and recheck the calcium level before working up further.

## 2018-06-14 NOTE — Progress Notes (Signed)
CC: Vaginal mass  HPI: Elizabeth Woodward is a 77 y.o. F w/ PMH of HTN, GERD, HLD, and osteoporosis presenting to the clinic with complaints of palpable vaginal mass. She was in her usual state of health until couple years ago she began to feel groin discomfort and 'heaviness.' She states it has been gradually more bothersome and she was concerned about its identity. She states she has not been sexually active for more than 10 years and denies any vaginal discharge, dysuria, bleeding, burning pain or pruritus. Denies any urinary frequency, retention, or incontinence. She states this mass appears to move around and while she describes it as vaginal mass, it's position is described as 'inside and kind of front.' She states she has not been able to visualize it well but describes it as a smooth mobile mass. She has not seen ob/gyn.   She denies any family history of ovarian or uterine cancers. She has 2 children born via C-section and has never had a vaginal birth.   Past Medical History:  Diagnosis Date  . Angioedema    secondary to niacin  . Anxiety   . GERD (gastroesophageal reflux disease)   . History of melanoma    status post excision of lesion 2006, dermatology  re-eva 2011  . Hypercalcemia    possibloe association with HCTZ but patient wants to continue diuretic  . Hyperlipidemia     statin intolerance severe, myopathy  . Hypertension   . Hypomagnesemia    secondary to bisphosphonate IV  . Intravenous bisphosphonates causing adverse effect in therapeutic use    Glu-like illness and electrolyte disturbance  . Osteoporosis   . Raynaud's phenomenon    negative ANA, ESR    Review of Systems: Review of Systems  Constitutional: Negative for fever, malaise/fatigue and weight loss.  Respiratory: Negative for cough, shortness of breath and wheezing.   Cardiovascular: Positive for palpitations. Negative for chest pain and leg swelling.  Gastrointestinal: Negative for constipation,  diarrhea, nausea and vomiting.  Genitourinary: Negative for dysuria, flank pain, frequency, hematuria and urgency.  Musculoskeletal: Negative for falls and joint pain.  Skin: Negative for itching and rash.  Neurological: Negative for dizziness, sensory change and weakness.  Psychiatric/Behavioral: The patient is nervous/anxious.      Physical Exam: Vitals:   06/10/18 1432 06/10/18 1526 06/10/18 1529  BP: (!) 169/62  (!) 169/76  Pulse: (!) 148 (!) (P) 135 (!) 136  Temp: 97.9 F (36.6 C)    TempSrc: Oral    SpO2: 99% (P) 99% 99%  Weight: 110 lb 9.6 oz (50.2 kg)    Height: '4\' 11"'  (1.499 m)      Physical Exam  Constitutional: She appears well-developed and well-nourished.  Anxious-appearing  HENT:  Head: Normocephalic and atraumatic.  Mouth/Throat: Oropharynx is clear and moist.  Eyes: Pupils are equal, round, and reactive to light. Conjunctivae and EOM are normal. No scleral icterus.  Neck: Normal range of motion. Neck supple. No JVD present.  Cardiovascular: Regular rhythm, normal heart sounds and intact distal pulses.  tachycardic  Respiratory: Effort normal and breath sounds normal. She has no wheezes. She has no rales.  GI: Soft. Bowel sounds are normal. She exhibits no distension. There is no tenderness. There is no guarding.  Genitourinary: Vagina normal. No vaginal discharge found.  Genitourinary Comments: External and internal labia normal. Vulva normal. No rash, skin changes, ulcers or mass visualized. On bearing down, able to visualize protusion of vaginal wall through vagina. Difficult to visualize  cervix due to collapse of vaginal wall + external hemorrhoids    Assessment & Plan:   Essential hypertension BP Readings from Last 3 Encounters:  06/10/18 (!) 169/76  11/26/17 (!) 147/80  08/27/17 (!) 205/94   Mrs.Chason has a significant history of white coat hypertension but she brought a meticulous notebook of home bps: systolic 606~770H, diastolic 40~35. Chart  review reveals this is similar presentation to prior visits. Denies any chest pain, palpitations, headaches, blurry vision, dizziness or light-headedness.  - Request that she brings her blood pressure cuff to next visit. - C/w amlodipine 59m daily and lisinopril 467mdaily - BMP today to assess kidney function  Normocytic anemia Hx of normocytic anemia in 2017. Denies any fatigue, dyspnea, or light-headedness. Does mention some occasional blood on toilet paper due to known hemorrhoids confirmed on physical exam.  - Will check cbc today  Other constipation Requesting stool softener for more regular bowel movements.  - Miralax 17g daily as needed  Vaginal wall prolapse Presenting with complaints of 'vaginal mass' and groin fullness and discomfort of gradual onset for last couple years. She states she needed some time to build up courage to discuss the issue with a physician as she has high anxiety when seeing new providers. Denies any dysuria, pruritus, urinary incontinence, vaginal discharge. No recent sexual history. Gyn history significant for 2 children born via C-sections. Physical exam reveals vaginal wall prolapse with increased abdominal pressure. Suspected age-related vaginal wall prolapse w or w/o uterine prolapse.  - Discussed option of referral for ob/gyn but patient states she is reluctant to see another new provider and defer the referral at this time.  - Counseled on kegal exercises    Patient seen with Dr. KlEppie Gibson -JoGilberto BetterPGY1

## 2018-06-14 NOTE — Assessment & Plan Note (Signed)
BP Readings from Last 3 Encounters:  06/10/18 (!) 169/76  11/26/17 (!) 147/80  08/27/17 (!) 205/94   Elizabeth Woodward has a significant history of white coat hypertension but she brought a meticulous notebook of home bps: systolic 691~675U, diastolic 12~54. Chart review reveals this is similar presentation to prior visits. Denies any chest pain, palpitations, headaches, blurry vision, dizziness or light-headedness.  - Request that she brings her blood pressure cuff to next visit. - C/w amlodipine 10mg  daily and lisinopril 40mg  daily - BMP today to assess kidney function

## 2018-06-14 NOTE — Assessment & Plan Note (Addendum)
Hx of normocytic anemia in 2017. Denies any fatigue, dyspnea, or light-headedness. Does mention some occasional blood on toilet paper due to known hemorrhoids confirmed on physical exam.  - Will check cbc today

## 2018-06-14 NOTE — Assessment & Plan Note (Signed)
Requesting stool softener for more regular bowel movements.  - Miralax 17g daily as needed

## 2018-06-16 ENCOUNTER — Telehealth: Payer: Self-pay | Admitting: Internal Medicine

## 2018-06-16 NOTE — Telephone Encounter (Signed)
Called and left voicemail on answering service about patient's lab results, including elevated calcium level.

## 2018-07-10 ENCOUNTER — Telehealth: Payer: Self-pay | Admitting: Internal Medicine

## 2018-07-10 NOTE — Telephone Encounter (Signed)
Called patient's home phone number and left voice mail about stopping her calcium supplementation as her last bmp showed hypercalcemia. Asked her to call back.

## 2018-07-11 ENCOUNTER — Telehealth: Payer: Self-pay | Admitting: *Deleted

## 2018-07-11 NOTE — Telephone Encounter (Signed)
Thank you for the message

## 2018-07-11 NOTE — Telephone Encounter (Signed)
Pt called back and stated she was cooking supper when dr Truman Hayward called, states she got the message and understands to stop the calcium supplement but feels real bad that she couldn't get to the phone when dr Truman Hayward called. She was reassured that it is quite alright and as long as she understands what to do that is the important thing. Call ended

## 2019-01-06 ENCOUNTER — Other Ambulatory Visit: Payer: Self-pay | Admitting: Internal Medicine

## 2019-01-06 DIAGNOSIS — I10 Essential (primary) hypertension: Secondary | ICD-10-CM

## 2019-01-20 ENCOUNTER — Encounter: Payer: Medicare Other | Admitting: Internal Medicine

## 2019-02-03 ENCOUNTER — Ambulatory Visit (INDEPENDENT_AMBULATORY_CARE_PROVIDER_SITE_OTHER): Payer: Medicare Other | Admitting: Internal Medicine

## 2019-02-03 ENCOUNTER — Other Ambulatory Visit: Payer: Self-pay

## 2019-02-03 DIAGNOSIS — Z0000 Encounter for general adult medical examination without abnormal findings: Secondary | ICD-10-CM

## 2019-02-03 DIAGNOSIS — R21 Rash and other nonspecific skin eruption: Secondary | ICD-10-CM

## 2019-02-03 DIAGNOSIS — M810 Age-related osteoporosis without current pathological fracture: Secondary | ICD-10-CM

## 2019-02-03 DIAGNOSIS — F419 Anxiety disorder, unspecified: Secondary | ICD-10-CM | POA: Diagnosis not present

## 2019-02-03 DIAGNOSIS — Z79899 Other long term (current) drug therapy: Secondary | ICD-10-CM | POA: Diagnosis not present

## 2019-02-03 DIAGNOSIS — R000 Tachycardia, unspecified: Secondary | ICD-10-CM

## 2019-02-03 DIAGNOSIS — K5900 Constipation, unspecified: Secondary | ICD-10-CM | POA: Diagnosis not present

## 2019-02-03 DIAGNOSIS — I10 Essential (primary) hypertension: Secondary | ICD-10-CM

## 2019-02-03 NOTE — Progress Notes (Signed)
   CC: Hypertension  HPI: Elizabeth Woodward is a 78 y.o. F w/ PMH of HTN, Osteoporosis and anxiety presenting to [facility] clinic for management of her chronic conditions. She states since her last visit 6 months ago she feels well and has no acute complaints at this time. She states she is currently living with her son and has been staying home mostly due to the pandemic. She denies any chest pain, palpitations, lower extremity edema. She mentions that she has stopped her calcium supplementation since her last visit and denies any significant constipation, bone pain or kidney stones. She does take Metamucil for her constipation.  Past Medical History:  Diagnosis Date  . Angioedema    secondary to niacin  . Anxiety   . GERD (gastroesophageal reflux disease)   . History of melanoma    status post excision of lesion 2006, dermatology  re-eva 2011  . Hypercalcemia    possibloe association with HCTZ but patient wants to continue diuretic  . Hyperlipidemia     statin intolerance severe, myopathy  . Hypertension   . Hypomagnesemia    secondary to bisphosphonate IV  . Intravenous bisphosphonates causing adverse effect in therapeutic use    Glu-like illness and electrolyte disturbance  . Osteoporosis   . Raynaud's phenomenon    negative ANA, ESR   Review of Systems: Review of Systems  Constitutional: Negative for chills, fever and malaise/fatigue.  Respiratory: Negative for shortness of breath.   Cardiovascular: Negative for chest pain and leg swelling.  Gastrointestinal: Negative for constipation, diarrhea, nausea and vomiting.  Musculoskeletal: Negative for joint pain.  Neurological: Negative for dizziness and headaches.    Physical Exam: Vitals:   02/03/19 1416 02/03/19 1436  BP: (!) 150/70 136/62  Pulse: (!) 140   Temp: 98 F (36.7 C)   TempSrc: Oral   SpO2: 100%   Weight: 108 lb 11.2 oz (49.3 kg)    Physical Exam  Constitutional: She appears well-developed and well-nourished.  No distress.  Cardiovascular: Regular rhythm, normal heart sounds and intact distal pulses.  tachycardic  Respiratory: Effort normal and breath sounds normal. She has no wheezes. She has no rales.  GI: Soft. Bowel sounds are normal. There is no abdominal tenderness.  Musculoskeletal: Normal range of motion.        General: No edema.  Skin: Skin is warm and dry. Rash (Forehead rash) noted.    Assessment & Plan:   Osteoporosis Noted to be hypercalcemic on last lab check while on calcium and vitamin D-3 supplementation for her osteoporosis. She has hx of osteoporosis confirmed on DEXA scan w/ T score of -2.4 in 2010 but was unable to tolerate bisphosphanates and refused alternative therapies except calcium and vit D supplements. Calcium stopped after found to have asymptomatic hypercalcemia on lab check.   - BMP, PTH, Vit metabolite today  Preventative health care Discussed need for tetanus vaccine. Requested defer of vaccination against next visit.  - Will re-offer TDAP at next visit  Essential hypertension BP Readings from Last 3 Encounters:  02/03/19 136/62  06/10/18 (!) 169/76  11/26/17 (!) 147/80   Again noted to be hypertensive and tachycardiac, but home notebook reviewed with systolic bps in 659-935T and diastolic bps in 01-77L. Denies any chest pain, palpitations, headache, blurry vision.  - C/w amlodipine 61m, lisinopril 452m- BMP today    Patient discussed with Dr. RaRebeca Alert -JoGilberto BetterPGY1

## 2019-02-03 NOTE — Patient Instructions (Signed)
Thank you for allowing [place] to provide your care today. Today we discussed your blood pressure   I have ordered PTH, Bmp and Vitamin D labs for you. I will call if any are abnormal.    Today we made no changes to your medications.    Please follow-up in 12 months.    Should you have any questions or concerns please call the internal medicine clinic at [phone].

## 2019-02-04 ENCOUNTER — Encounter: Payer: Self-pay | Admitting: Internal Medicine

## 2019-02-04 LAB — PTH, INTACT AND CALCIUM
Calcium: 10.3 mg/dL (ref 8.7–10.3)
PTH: 29 pg/mL (ref 15–65)

## 2019-02-04 NOTE — Assessment & Plan Note (Signed)
Noted to be hypercalcemic on last lab check while on calcium and vitamin D-3 supplementation for her osteoporosis. She has hx of osteoporosis confirmed on DEXA scan w/ T score of -2.4 in 2010 but was unable to tolerate bisphosphanates and refused alternative therapies except calcium and vit D supplements. Calcium stopped after found to have asymptomatic hypercalcemia on lab check.   - BMP, PTH, Vit metabolite today

## 2019-02-04 NOTE — Assessment & Plan Note (Addendum)
Discussed need for tetanus vaccine. Requested defer of vaccination against next visit.  - Will re-offer TDAP at next visit

## 2019-02-04 NOTE — Assessment & Plan Note (Signed)
BP Readings from Last 3 Encounters:  02/03/19 136/62  06/10/18 (!) 169/76  11/26/17 (!) 147/80   Again noted to be hypertensive and tachycardiac, but home notebook reviewed with systolic bps in 184-037V and diastolic bps in 43-60O. Denies any chest pain, palpitations, headache, blurry vision.  - C/w amlodipine 10mg , lisinopril 40mg  - BMP today

## 2019-02-05 NOTE — Progress Notes (Signed)
Internal Medicine Clinic Attending  Case discussed with Dr. Lee at the time of the visit.  We reviewed the resident's history and exam and pertinent patient test results.  I agree with the assessment, diagnosis, and plan of care documented in the resident's note.  Alexander Raines, M.D., Ph.D.  

## 2019-02-09 LAB — BMP8+ANION GAP
Anion Gap: 18 mmol/L (ref 10.0–18.0)
BUN/Creatinine Ratio: 13 (ref 12–28)
BUN: 19 mg/dL (ref 8–27)
CO2: 21 mmol/L (ref 20–29)
Calcium: 10.8 mg/dL — ABNORMAL HIGH (ref 8.7–10.3)
Chloride: 96 mmol/L (ref 96–106)
Creatinine, Ser: 1.45 mg/dL — ABNORMAL HIGH (ref 0.57–1.00)
GFR calc Af Amer: 40 mL/min/{1.73_m2} — ABNORMAL LOW (ref >59)
GFR calc non Af Amer: 35 mL/min/{1.73_m2} — ABNORMAL LOW (ref >59)
Glucose: 122 mg/dL — ABNORMAL HIGH (ref 65–99)
Potassium: 4.3 mmol/L (ref 3.5–5.2)
Sodium: 135 mmol/L (ref 134–144)

## 2019-02-09 LAB — VITAMIN D 1,25 DIHYDROXY
Vitamin D 1, 25 (OH)2 Total: 49 pg/mL
Vitamin D2 1, 25 (OH)2: <10 pg/mL
Vitamin D3 1, 25 (OH)2: 49 pg/mL

## 2019-07-07 ENCOUNTER — Other Ambulatory Visit: Payer: Self-pay | Admitting: Internal Medicine

## 2019-07-07 DIAGNOSIS — I10 Essential (primary) hypertension: Secondary | ICD-10-CM

## 2019-09-28 ENCOUNTER — Other Ambulatory Visit: Payer: Self-pay | Admitting: Internal Medicine

## 2019-09-28 DIAGNOSIS — I10 Essential (primary) hypertension: Secondary | ICD-10-CM

## 2019-12-28 ENCOUNTER — Other Ambulatory Visit: Payer: Self-pay | Admitting: Internal Medicine

## 2019-12-28 DIAGNOSIS — I10 Essential (primary) hypertension: Secondary | ICD-10-CM

## 2020-01-06 ENCOUNTER — Other Ambulatory Visit: Payer: Self-pay | Admitting: Internal Medicine

## 2020-01-06 DIAGNOSIS — I10 Essential (primary) hypertension: Secondary | ICD-10-CM

## 2020-03-14 ENCOUNTER — Encounter: Payer: Self-pay | Admitting: Internal Medicine

## 2020-03-14 ENCOUNTER — Other Ambulatory Visit: Payer: Self-pay

## 2020-03-14 ENCOUNTER — Ambulatory Visit (HOSPITAL_COMMUNITY)
Admission: RE | Admit: 2020-03-14 | Discharge: 2020-03-14 | Disposition: A | Discharge status: Home / Self Care | Payer: Medicare Other | Source: Ambulatory Visit | Attending: Internal Medicine | Admitting: Internal Medicine

## 2020-03-14 ENCOUNTER — Ambulatory Visit (INDEPENDENT_AMBULATORY_CARE_PROVIDER_SITE_OTHER): Payer: Medicare Other | Admitting: Internal Medicine

## 2020-03-14 VITALS — BP 163/72 | HR 130 | Temp 98.4°F | Wt 106.3 lb

## 2020-03-14 DIAGNOSIS — R000 Tachycardia, unspecified: Secondary | ICD-10-CM | POA: Insufficient documentation

## 2020-03-14 DIAGNOSIS — R739 Hyperglycemia, unspecified: Secondary | ICD-10-CM

## 2020-03-14 DIAGNOSIS — G250 Essential tremor: Secondary | ICD-10-CM | POA: Diagnosis not present

## 2020-03-14 DIAGNOSIS — I10 Essential (primary) hypertension: Secondary | ICD-10-CM | POA: Diagnosis not present

## 2020-03-14 DIAGNOSIS — E782 Mixed hyperlipidemia: Secondary | ICD-10-CM | POA: Diagnosis not present

## 2020-03-14 DIAGNOSIS — W57XXXA Bitten or stung by nonvenomous insect and other nonvenomous arthropods, initial encounter: Secondary | ICD-10-CM

## 2020-03-14 LAB — GLUCOSE, CAPILLARY: Glucose-Capillary: 113 mg/dL — ABNORMAL HIGH (ref 70–99)

## 2020-03-14 MED ORDER — HYDROCORTISONE 1 % EX CREA: TOPICAL_CREAM | CUTANEOUS | 1 refills | Status: AC

## 2020-03-14 MED ORDER — PROPRANOLOL HCL ER 80 MG PO CP24: 80.0000 mg | ORAL_CAPSULE | Freq: Every day | ORAL | 2 refills | Status: DC

## 2020-03-14 NOTE — Assessment & Plan Note (Signed)
Patient is not currently taking any statin due to intolerance.  Assessment/plan: Lipid panel has not been evaluated in a couple years so we will repeat today.  Will evaluate ASCVD score at that time and assess for need of a statin.  -Lipid panel pending

## 2020-03-14 NOTE — Assessment & Plan Note (Signed)
Previous BMPs notable for mild elevation in glucose.  Patient has not been tested for diabetes, so we will do so today.  Assessment/plan: -A1c pending

## 2020-03-14 NOTE — Assessment & Plan Note (Signed)
Elizabeth Woodward states that she has had a chronic tremor of her upper extremities that seems to be worse during moments of anxiety.  She denies any stiffness though.  She denies previous work-up for this.  Assessment/plan: On examination, tremor is most noticeable at rest and improves with movement.  Neurological exam intact without any evidence of cogwheel rigidity or pill-rolling behavior to suggest other causes of her tremor.  We will be starting propranolol for tachycardia and hypertension, which will surely help with this tremor as well.  Continue to monitor

## 2020-03-14 NOTE — Assessment & Plan Note (Addendum)
Patient has been noted to have tachycardia during office visits dating back to 2019.  At home, patient states that she checks her blood pressure regularly and occasionally this does tell her heart rate.  Her average heart rate at home is in the mid 90s but she has seen a spike up to 114 when she is at rest.  Occasionally she does feel palpitations, but is mostly asymptomatic.  She denies any dizziness, vision changes or loss of consciousness.  Assessment/plan: EKG obtained during this visit and shows sinus tachycardia without any evidence of LVH, ischemia or infarct.  On examination, patient was with a regular rhythm.  Likely secondary to patient's significant anxiety coming into an office visit and possibly baseline anxiety even at home given that she is still on the high side there.  We will also evaluate for anemia, although patient denies any recent bleeding.  I am reassured that there is no LVH on EKG to suggest tachycardia cardiomyopathy.  We will begin treatment with propranolol.  -Propranolol 80 mg extended release daily

## 2020-03-14 NOTE — Progress Notes (Addendum)
   CC: HTN medication  HPI:  Ms.Catori R Riga is a 79 y.o. with a PMHx as listed below who presents to the clinic for HTN medication.   Please see the Encounters tab for problem-based Assessment & Plan regarding status of patient's acute and chronic conditions.  Past Medical History:  Diagnosis Date  . Angioedema    secondary to niacin  . Anxiety   . GERD (gastroesophageal reflux disease)   . History of melanoma    status post excision of lesion 2006, dermatology  re-eva 2011  . Hypercalcemia    possibloe association with HCTZ but patient wants to continue diuretic  . Hyperlipidemia     statin intolerance severe, myopathy  . Hypertension   . Hypomagnesemia    secondary to bisphosphonate IV  . Intravenous bisphosphonates causing adverse effect in therapeutic use    Glu-like illness and electrolyte disturbance  . Osteoporosis   . Raynaud's phenomenon    negative ANA, ESR   Review of Systems: Review of Systems  Constitutional: Positive for weight loss. Negative for chills and fever.  Respiratory: Negative for cough and shortness of breath.   Cardiovascular: Positive for chest pain (very infrequently, can't remember last episode) and palpitations. Negative for leg swelling.  Gastrointestinal: Negative for abdominal pain, diarrhea, nausea and vomiting.  Skin: Positive for itching and rash.  Neurological: Positive for tremors. Negative for dizziness, focal weakness and headaches.  Psychiatric/Behavioral: The patient is nervous/anxious.    Physical Exam:  Vitals:   03/14/20 1351  BP: (!) 163/72  Pulse: (!) 130  Temp: 98.4 F (36.9 C)  TempSrc: Oral  SpO2: 98%  Weight: 106 lb 4.8 oz (48.2 kg)   Physical Exam Vitals and nursing note reviewed.  Constitutional:      General: She is not in acute distress.    Appearance: She is normal weight.  Cardiovascular:     Rate and Rhythm: Regular rhythm. Tachycardia present.     Heart sounds: No murmur heard.   Pulmonary:      Effort: Pulmonary effort is normal. No respiratory distress.     Breath sounds: No wheezing or rales.  Musculoskeletal:     Right lower leg: No edema.     Left lower leg: No edema.  Skin:    General: Skin is warm and dry.     Comments: Bilateral palms are clammy Small 1-2 mm areas on the lower abdomen and throughout the back that are slightly erythematous with a central opening most consistent with a bug bite  Neurological:     General: No focal deficit present.     Mental Status: She is alert and oriented to person, place, and time.     Coordination: Finger-Nose-Finger Test normal.     Gait: Gait is intact.     Comments: Tremor at rest of bilateral upper extremities. No pill-rolling features of tremor. No cogwheel rigidity. Strength 5/5 in bilateral upper extremities.   Psychiatric:        Mood and Affect: Mood is anxious.        Speech: Speech normal.        Behavior: Behavior normal.        Thought Content: Thought content normal.     Assessment & Plan:   See Encounters Tab for problem based charting.  Patient discussed with Dr. Heber [place]

## 2020-03-14 NOTE — Assessment & Plan Note (Signed)
Elizabeth Woodward states that for approximately 2 weeks now, she has had numerous itchy areas throughout her trunk and back.  She describes these as small red lesions but cannot recall any known triggers.  She does note that she was recently treated her dogs for fleas and had to give them baths.  She denies any known sources for bedbugs or known bedbugs.  Assessment/plan:

## 2020-03-14 NOTE — Assessment & Plan Note (Addendum)
[name] states that she has been able to take her amlodipine and lisinopril daily without any issues. She is concerned about how many potential side effects of amlodipine were listed on the pamphlet provided to her by her pharmacy.  She is wondering if the amlodipine may be contributing to her elevated heart rate.  She endorses occasional chest pain with exertion and rest that is so infrequent she cannot member the last episode.  She denies any dizziness or vision changes.  She denies any lower extremity swelling.  [name] states that she does check her blood pressure regularly at home, but for forgot her log.  She notes at home her SBP averages 130.  Assessment/plan: Patient was reassured regarding the use of amlodipine and she is in agreement to continue it. Her blood pressure in the office visits always seem to be higher than her home readings as noted on previous assessments, likely secondary to patient's extreme anxiety in regards to coming to office visits.  However, she would benefit from tighter control at this point.  We will go with a beta-blocker, which will help with her tachycardia and tremor as well.  Given that patient had evidence of elevated creatinine at last visit over 1 year ago, will repeat BMP today.   -Continue amlodipine 10 mg daily -Continue lisinopril 40 mg daily -Start propranolol 80 mg extended release -28-month follow-up -BMP pending

## 2020-03-14 NOTE — Patient Instructions (Addendum)
It was nice seeing you today! Thank you for choosing [facility] for your Primary Care.    Today we talked about:   1. High blood pressure: Please continue taking Amlodipine and Lisinopril daily. These are both very well tolerated medications. In addition, I would like to add Propanolol, which will help both your blood pressure and heart rate.   2. Heart rate: We obtained an EKG today that looked within normal limits. Please continue monitoring your rates at home and contact the clinic if your rate is ever above 125 at home.   3. I will call you if any lab work is abnormal

## 2020-03-15 LAB — CBC
Hematocrit: 36.5 % (ref 34.0–46.6)
Hemoglobin: 12.4 g/dL (ref 11.1–15.9)
MCH: 30.5 pg (ref 26.6–33.0)
MCHC: 34 g/dL (ref 31.5–35.7)
MCV: 90 fL (ref 79–97)
Platelets: 416 10*3/uL (ref 150–450)
RBC: 4.06 x10E6/uL (ref 3.77–5.28)
RDW: 13.2 % (ref 11.7–15.4)
WBC: 11.4 10*3/uL — ABNORMAL HIGH (ref 3.4–10.8)

## 2020-03-15 LAB — BMP8+ANION GAP
Anion Gap: 22 mmol/L — ABNORMAL HIGH (ref 10.0–18.0)
BUN/Creatinine Ratio: 17 (ref 12–28)
BUN: 17 mg/dL (ref 8–27)
CO2: 16 mmol/L — ABNORMAL LOW (ref 20–29)
Calcium: 11.1 mg/dL — ABNORMAL HIGH (ref 8.7–10.3)
Chloride: 90 mmol/L — ABNORMAL LOW (ref 96–106)
Creatinine, Ser: 0.99 mg/dL (ref 0.57–1.00)
GFR calc Af Amer: 63 mL/min/{1.73_m2} (ref >59)
GFR calc non Af Amer: 55 mL/min/{1.73_m2} — ABNORMAL LOW (ref >59)
Glucose: 114 mg/dL — ABNORMAL HIGH (ref 65–99)
Potassium: 4.8 mmol/L (ref 3.5–5.2)
Sodium: 128 mmol/L — ABNORMAL LOW (ref 134–144)

## 2020-03-15 LAB — LIPID PANEL
Chol/HDL Ratio: 3.5 ratio (ref 0.0–4.4)
Cholesterol, Total: 272 mg/dL — ABNORMAL HIGH (ref 100–199)
HDL: 78 mg/dL (ref >39)
LDL Chol Calc (NIH): 172 mg/dL — ABNORMAL HIGH (ref 0–99)
Triglycerides: 124 mg/dL (ref 0–149)
VLDL Cholesterol Cal: 22 mg/dL (ref 5–40)

## 2020-03-16 NOTE — Progress Notes (Signed)
Internal Medicine Clinic Attending  Case discussed with Dr. Basaraba  At the time of the visit.  We reviewed the resident's history and exam and pertinent patient test results.  I agree with the assessment, diagnosis, and plan of care documented in the resident's note.  

## 2020-03-22 ENCOUNTER — Telehealth: Payer: Self-pay | Admitting: Internal Medicine

## 2020-03-22 NOTE — Telephone Encounter (Addendum)
Contacted patient to discuss lab work from last week's visit. Requested she make a follow up visit in the next week for re-evaluation and possibly repeat labs. Elizabeth Woodward is amenable to this plan. Will send message to front desk to schedule.  She notes that she has been feeling well and making sure to hydrate. She denies any cough, SOB or chest pain. She has continued to check her BP regularly and reports they range from 120-136/69-76 with HR between 59-76.

## 2020-06-03 ENCOUNTER — Encounter: Payer: Medicare Other | Admitting: Internal Medicine

## 2020-06-24 ENCOUNTER — Other Ambulatory Visit: Payer: Self-pay | Admitting: Internal Medicine

## 2020-06-24 DIAGNOSIS — R000 Tachycardia, unspecified: Secondary | ICD-10-CM

## 2020-06-24 DIAGNOSIS — G250 Essential tremor: Secondary | ICD-10-CM

## 2020-06-24 DIAGNOSIS — I10 Essential (primary) hypertension: Secondary | ICD-10-CM

## 2020-06-27 ENCOUNTER — Other Ambulatory Visit: Payer: Self-pay | Admitting: Internal Medicine

## 2020-06-27 DIAGNOSIS — I10 Essential (primary) hypertension: Secondary | ICD-10-CM

## 2020-07-26 ENCOUNTER — Other Ambulatory Visit: Payer: Self-pay | Admitting: Internal Medicine

## 2020-07-26 DIAGNOSIS — I10 Essential (primary) hypertension: Secondary | ICD-10-CM

## 2020-08-02 ENCOUNTER — Encounter: Payer: Medicare Other | Admitting: Internal Medicine

## 2020-08-08 ENCOUNTER — Other Ambulatory Visit: Payer: Self-pay | Admitting: Internal Medicine

## 2020-08-08 DIAGNOSIS — I10 Essential (primary) hypertension: Secondary | ICD-10-CM

## 2020-08-08 DIAGNOSIS — G250 Essential tremor: Secondary | ICD-10-CM

## 2020-08-08 DIAGNOSIS — R000 Tachycardia, unspecified: Secondary | ICD-10-CM

## 2020-09-06 ENCOUNTER — Other Ambulatory Visit: Payer: Self-pay | Admitting: Internal Medicine

## 2020-09-06 DIAGNOSIS — I10 Essential (primary) hypertension: Secondary | ICD-10-CM

## 2020-09-06 DIAGNOSIS — R000 Tachycardia, unspecified: Secondary | ICD-10-CM

## 2020-09-06 DIAGNOSIS — G250 Essential tremor: Secondary | ICD-10-CM

## 2020-09-28 ENCOUNTER — Encounter: Payer: Self-pay | Admitting: Student

## 2020-09-28 ENCOUNTER — Ambulatory Visit (INDEPENDENT_AMBULATORY_CARE_PROVIDER_SITE_OTHER): Payer: Medicare Other | Admitting: Student

## 2020-09-28 ENCOUNTER — Other Ambulatory Visit: Payer: Self-pay

## 2020-09-28 ENCOUNTER — Ambulatory Visit (HOSPITAL_COMMUNITY)
Admission: RE | Admit: 2020-09-28 | Discharge: 2020-09-28 | Disposition: A | Discharge status: Home / Self Care | Payer: Medicare Other | Source: Ambulatory Visit | Attending: Student | Admitting: Student

## 2020-09-28 VITALS — BP 165/79 | HR 105 | Temp 98.4°F | Ht 60.0 in | Wt 109.0 lb

## 2020-09-28 DIAGNOSIS — R0789 Other chest pain: Secondary | ICD-10-CM | POA: Insufficient documentation

## 2020-09-28 DIAGNOSIS — E785 Hyperlipidemia, unspecified: Secondary | ICD-10-CM

## 2020-09-28 DIAGNOSIS — M810 Age-related osteoporosis without current pathological fracture: Secondary | ICD-10-CM | POA: Diagnosis not present

## 2020-09-28 DIAGNOSIS — R079 Chest pain, unspecified: Secondary | ICD-10-CM

## 2020-09-28 DIAGNOSIS — F419 Anxiety disorder, unspecified: Secondary | ICD-10-CM | POA: Diagnosis not present

## 2020-09-28 DIAGNOSIS — R739 Hyperglycemia, unspecified: Secondary | ICD-10-CM

## 2020-09-28 DIAGNOSIS — I10 Essential (primary) hypertension: Secondary | ICD-10-CM | POA: Diagnosis not present

## 2020-09-28 MED ORDER — CARBAMIDE PEROXIDE 6.5 % OT SOLN: 5.0000 [drp] | Freq: Two times a day (BID) | OTIC | 0 refills | Status: AC

## 2020-09-28 MED ORDER — EZETIMIBE 10 MG PO TABS: 10.0000 mg | ORAL_TABLET | Freq: Every day | ORAL | 3 refills | Status: DC

## 2020-09-28 MED ORDER — DULOXETINE HCL 20 MG PO CPEP: 20.0000 mg | ORAL_CAPSULE | Freq: Every day | ORAL | 2 refills | Status: DC

## 2020-09-28 NOTE — Progress Notes (Signed)
CC: HTN, HLD, Chest Pain, Anxiety  HPI:  [name] is a 80 y.o. female with a past medical history stated below and presents today for  HTN, HLD, Chest Pain, Anxiety. Please see problem based assessment and plan for additional details.  Past Medical History:  Diagnosis Date  . Angioedema    secondary to niacin  . Anxiety   . GERD (gastroesophageal reflux disease)   . History of melanoma    status post excision of lesion 2006, dermatology  re-eva 2011  . Hypercalcemia    possibloe association with HCTZ but patient wants to continue diuretic  . Hyperlipidemia     statin intolerance severe, myopathy  . Hypertension   . Hypomagnesemia    secondary to bisphosphonate IV  . Intravenous bisphosphonates causing adverse effect in therapeutic use    Glu-like illness and electrolyte disturbance  . Osteoporosis   . Raynaud's phenomenon    negative ANA, ESR    Current Outpatient Medications on File Prior to Visit  Medication Sig Dispense Refill  . amLODipine (NORVASC) 10 MG tablet Take 1 tablet by mouth once daily with breakfast 90 tablet 0  . aspirin 81 MG tablet Take 81 mg by mouth daily.      . Cholecalciferol (VITAMIN D-3 PO) Take 2 tablets by mouth daily.     . famotidine (PEPCID) 20 MG tablet Take 20 mg by mouth 2 (two) times daily. Take in AM and PM     . hydrocortisone cream 1 % Apply to affected area (bug bites) 2 times daily 30 g 1  . lisinopril (ZESTRIL) 40 MG tablet TAKE 1 TABLET (40 MG TOTAL) BY MOUTH DAILY WITH SUPPER. 90 tablet 1  . Multiple Vitamin (MULTIVITAMIN) tablet Take 1 tablet by mouth daily.      . Omega 3-6-9 Fatty Acids (OMEGA-3 & OMEGA-6 FISH OIL PO) Take 1 tablet by mouth daily. Reported on 01/24/2016    . propranolol ER (INDERAL LA) 80 MG 24 hr capsule Take 1 capsule by mouth once daily 30 capsule 0  . Wheat Dextrin (BENEFIBER) TABS Take 1 tablet by mouth 2 (two) times daily. Reported on 01/24/2016     No current facility-administered medications on  file prior to visit.    Family History  Problem Relation Age of Onset  . Heart disease Mother   . Hypertension Mother   . Heart disease Father   . Cancer Brother 19       prostate and lung    Social History   Socioeconomic History  . Marital status: Single    Spouse name: Not on file  . Number of children: Not on file  . Years of education: Not on file  . Highest education level: Not on file  Occupational History  . Occupation: retired    Fish farm manager: RETIRED  Tobacco Use  . Smoking status: Former Smoker    Packs/day: 0.25    Years: 5.00    Pack years: 1.25    Types: Cigarettes    Quit date: 05/06/2010    Years since quitting: 10.4  . Smokeless tobacco: Never Used  Substance and Sexual Activity  . Alcohol use: No    Alcohol/week: 0.0 standard drinks  . Drug use: No  . Sexual activity: Never  Other Topics Concern  . Not on file  Social History Narrative  . Not on file   Social Determinants of Health   Financial Resource Strain: Not on file  Food Insecurity: Not on file  Transportation  Needs: Not on file  Physical Activity: Not on file  Stress: Not on file  Social Connections: Not on file  Intimate Partner Violence: Not on file    Review of Systems: ROS negative except for what is noted on the assessment and plan.  Vitals:   09/28/20 1036  BP: (!) 165/79  Pulse: (!) 105  Temp: 98.4 F (36.9 C)  TempSrc: Oral  SpO2: 99%  Weight: 109 lb (49.4 kg)  Height: 5' (1.524 m)     Physical Exam: Constitutional: well-appearing, sitting in in chair HENT: normocephalic atraumatic Eyes: conjunctiva non-erythematous Neck: supple Cardiovascular: regular rate and rhythm, systolic murmur present. Best heard over right 2nd intercostal space. Pulmonary/Chest: normal work of breathing on room air Abdominal: soft, non-tender, non-distended MSK: normal bulk and tone Neurological: alert & oriented x 3 Skin: warm and dry Psych: Normal mood   Assessment & Plan:    See Encounters Tab for problem based charting.  Patient discussed with Dr. Fanny Bien, D.O. [facility], PGY-1 Pager: [phone], Phone: [phone] Date 09/28/2020 Time 4:50 PM
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
## 2020-09-28 NOTE — Assessment & Plan Note (Addendum)
Lab Results  Component Value Date   CHOL 272 (H) 03/14/2020   HDL 78 03/14/2020   LDLCALC 172 (H) 03/14/2020   TRIG 124 03/14/2020   CHOLHDL 3.5 03/14/2020   Assessment: 10 year ASCVD risk score for home BP readings over 30%. Patient in high risk category. Patient unable to tolerate statin in the past. Will start with zetia and plan to reassess in 6 weeks  Plan: -start zedia 10 mg daily -repeat LDL in 6 weeks

## 2020-09-28 NOTE — Assessment & Plan Note (Signed)
Assessment: Patient endorses intermittent episodes of central chest pain that she describes as a tightening.Denies radiation of the pain. Notes tylenol improves the pain. Denies reproducibility of the pain with palpation. Denies shortness of breath or limited activity recently due to chest pain. States pain is sporadic. Does have episodes of palpitations that she describes as fluttering that resolve on their own.  With her elevated ASCVD risk score, Hx of HTN, and hypercalcemia, do worry that may be having episodes of angina. EKG ordered as well as echocardiogram in setting of new systolic murmur.   EKG with rate of 96, normal sinus rhythm. Suspected incomplete RBBB. No evidence of q waves or ST segment abnormalities.  Plan: -Echo pending

## 2020-09-28 NOTE — Assessment & Plan Note (Addendum)
Assessment: Patient with recent labs, elevated calcium of 11.1 6 months ago and appeared to have remained high after stopping stopping calcium supplementation. Pt with hx of osteoperosis and was calcium supplementation that was stopped secondary to hypercalcemia in 2020. PTH at that time was normal. Since, patient has remained hypercalcemic. Will repeat calclium levels as well as PTH.   Plan: -PTH,CMET ordered -CBC w/ diff also ordered  Addendum: Patient with elevated calcium, however, normalizes when corrected for albumin. PTH is normal. Because of her age, risk factors, and lab abnormalities will order urinary calcium, ionized calcium and PTHrH to assure that there is no other underlying etiology present.

## 2020-09-28 NOTE — Assessment & Plan Note (Signed)
Assessment: A1c not drawn today, does not appear future lab order worked. Will request it be drawn at next visit.    Plan: -CMP today, will assess glucose -A1c at next visit

## 2020-09-28 NOTE — Assessment & Plan Note (Signed)
BP Readings from Last 3 Encounters:  09/28/20 (!) 165/79  03/14/20 (!) 163/72  02/03/19 136/62   Assessment: Patient with Hx of HTN and adherence to her medication. Known to have white coat syndrome. She brought in BP readings from home that ranged from 815-947 systolic and 07-61'H diastolic. She is adherent to current regimen of amlodipine 10 mg, lisinopril 40 mg. She also takes propranolol ER 80 mg for her tachycardia. No changes at this time, she is doing well without episodes of dizziness or lightheadedness.   Plan: -continue amlodipine 10 mg, lisinopril 40 mg, and propranolol 80 mg. -instruct pt to continue to check BP's at home every 2 weeks or so

## 2020-09-28 NOTE — Patient Instructions (Addendum)
Thank you, Ms.Colleen Can for allowing [place] to provide your care today. Today we discussed:  High Blood Pressure - Your blood pressure looks good, please continue check your blood pressure every 2 weeks and write them down. Please bring them to your next visit. Continue taking your medicines  High Cholesterol - Your cholesterol levels are high, we will be starting you on a medication called zetia (not a statin, as you were allergic in the past.)  Chest Pain - We will checking an EKG today and I ordered an echo (this will take a picture of your heart to assess the pumping function)  Anxiety  - We will be starting you on a medication called duloxetine today. I have given you my card, if anything comes up, please call and ask to speak with me.   Ear Wax - Please use the drops prescribed two times a day for 4 days.   High Calcium - your calcium levels are high so we will be doing some blood work today to figure out why  I have ordered the following labs for you:   Lab Orders     CBC with Diff     CMP14 + Anion Gap     Parathyroid Hormone, Intact w/Ca   Referrals ordered today:   Referral Orders  No referral(s) requested today     I have ordered the following medication/changed the following medications:   Stop the following medications: There are no discontinued medications.   Start the following medications: No orders of the defined types were placed in this encounter.    Follow up: 2 months    Remember: Call us if you need anything or have any questions!  Should you have any questions or concerns please call the internal medicine clinic at [phone].     Sanjuana Letters, D.O. [place]

## 2020-09-29 LAB — CMP14 + ANION GAP
ALT: 11 IU/L (ref 0–32)
AST: 16 IU/L (ref 0–40)
Albumin/Globulin Ratio: 1.7 (ref 1.2–2.2)
Albumin: 5 g/dL — ABNORMAL HIGH (ref 3.7–4.7)
Alkaline Phosphatase: 130 IU/L — ABNORMAL HIGH (ref 44–121)
Anion Gap: 20 mmol/L — ABNORMAL HIGH (ref 10.0–18.0)
BUN/Creatinine Ratio: 22 (ref 12–28)
BUN: 22 mg/dL (ref 8–27)
Bilirubin Total: 0.2 mg/dL (ref 0.0–1.2)
CO2: 21 mmol/L (ref 20–29)
Calcium: 10.9 mg/dL — ABNORMAL HIGH (ref 8.7–10.3)
Chloride: 96 mmol/L (ref 96–106)
Creatinine, Ser: 1 mg/dL (ref 0.57–1.00)
GFR calc Af Amer: 62 mL/min/{1.73_m2} (ref >59)
GFR calc non Af Amer: 54 mL/min/{1.73_m2} — ABNORMAL LOW (ref >59)
Globulin, Total: 3 g/dL (ref 1.5–4.5)
Glucose: 106 mg/dL — ABNORMAL HIGH (ref 65–99)
Potassium: 4.7 mmol/L (ref 3.5–5.2)
Sodium: 137 mmol/L (ref 134–144)
Total Protein: 8 g/dL (ref 6.0–8.5)

## 2020-09-29 LAB — PTH, INTACT AND CALCIUM
Calcium: 10.8 mg/dL — ABNORMAL HIGH (ref 8.7–10.3)
PTH: 27 pg/mL (ref 15–65)

## 2020-09-29 LAB — CBC WITH DIFFERENTIAL/PLATELET
Basophils Absolute: 0.1 10*3/uL (ref 0.0–0.2)
Basos: 1 %
EOS (ABSOLUTE): 0.1 10*3/uL (ref 0.0–0.4)
Eos: 1 %
Hematocrit: 40.6 % (ref 34.0–46.6)
Hemoglobin: 13.5 g/dL (ref 11.1–15.9)
Immature Grans (Abs): 0.1 10*3/uL (ref 0.0–0.1)
Immature Granulocytes: 1 %
Lymphocytes Absolute: 2.2 10*3/uL (ref 0.7–3.1)
Lymphs: 22 %
MCH: 30.1 pg (ref 26.6–33.0)
MCHC: 33.3 g/dL (ref 31.5–35.7)
MCV: 90 fL (ref 79–97)
Monocytes Absolute: 0.7 10*3/uL (ref 0.1–0.9)
Monocytes: 7 %
Neutrophils Absolute: 6.8 10*3/uL (ref 1.4–7.0)
Neutrophils: 68 %
Platelets: 405 10*3/uL (ref 150–450)
RBC: 4.49 x10E6/uL (ref 3.77–5.28)
RDW: 13.1 % (ref 11.7–15.4)
WBC: 9.9 10*3/uL (ref 3.4–10.8)

## 2020-09-29 NOTE — Assessment & Plan Note (Addendum)
T = -2.4 in 2010. Unable to tolerate bisphosphonates in the past. Will consider starting patient on alternative therapy to bisphosphonates.

## 2020-09-29 NOTE — Addendum Note (Signed)
Addended by: Riesa Pope on: 09/29/2020 04:31 PM   Modules accepted: Orders

## 2020-10-03 ENCOUNTER — Other Ambulatory Visit (INDEPENDENT_AMBULATORY_CARE_PROVIDER_SITE_OTHER): Payer: Medicare Other

## 2020-10-03 DIAGNOSIS — R739 Hyperglycemia, unspecified: Secondary | ICD-10-CM | POA: Diagnosis not present

## 2020-10-03 LAB — GLUCOSE, CAPILLARY: Glucose-Capillary: 98 mg/dL (ref 70–99)

## 2020-10-03 LAB — POCT GLYCOSYLATED HEMOGLOBIN (HGB A1C): Hemoglobin A1C: 5.5 % (ref 4.0–5.6)

## 2020-10-03 NOTE — Addendum Note (Signed)
Addended by: Truddie Crumble on: 10/03/2020 11:58 AM   Modules accepted: Orders

## 2020-10-04 LAB — CALCIUM, IONIZED: Calcium, Ion: 5.2 mg/dL (ref 4.5–5.6)

## 2020-10-07 NOTE — Progress Notes (Signed)
Internal Medicine Clinic Attending  Case discussed with Dr. Katsadouros  At the time of the visit.  We reviewed the resident's history and exam and pertinent patient test results.  I agree with the assessment, diagnosis, and plan of care documented in the resident's note.  

## 2020-10-09 LAB — PTH-RELATED PEPTIDE: PTH-related peptide: <2 pmol/L

## 2020-10-14 ENCOUNTER — Other Ambulatory Visit: Payer: Self-pay | Admitting: Internal Medicine

## 2020-10-14 DIAGNOSIS — I10 Essential (primary) hypertension: Secondary | ICD-10-CM

## 2020-10-14 DIAGNOSIS — G250 Essential tremor: Secondary | ICD-10-CM

## 2020-10-14 DIAGNOSIS — R000 Tachycardia, unspecified: Secondary | ICD-10-CM

## 2020-10-18 ENCOUNTER — Ambulatory Visit (HOSPITAL_COMMUNITY)
Admission: RE | Admit: 2020-10-18 | Discharge: 2020-10-18 | Disposition: A | Discharge status: Home / Self Care | Payer: Medicare Other | Source: Ambulatory Visit | Attending: Internal Medicine | Admitting: Internal Medicine

## 2020-10-18 ENCOUNTER — Other Ambulatory Visit: Payer: Self-pay

## 2020-10-18 DIAGNOSIS — I361 Nonrheumatic tricuspid (valve) insufficiency: Secondary | ICD-10-CM | POA: Diagnosis not present

## 2020-10-18 DIAGNOSIS — E785 Hyperlipidemia, unspecified: Secondary | ICD-10-CM | POA: Diagnosis not present

## 2020-10-18 DIAGNOSIS — I7300 Raynaud's syndrome without gangrene: Secondary | ICD-10-CM | POA: Insufficient documentation

## 2020-10-18 DIAGNOSIS — R079 Chest pain, unspecified: Secondary | ICD-10-CM | POA: Insufficient documentation

## 2020-10-18 DIAGNOSIS — I071 Rheumatic tricuspid insufficiency: Secondary | ICD-10-CM | POA: Diagnosis not present

## 2020-10-18 DIAGNOSIS — Z87891 Personal history of nicotine dependence: Secondary | ICD-10-CM | POA: Diagnosis not present

## 2020-10-18 NOTE — Progress Notes (Signed)
  Echocardiogram 2D Echocardiogram with 3D has been performed.  Elizabeth Woodward M 10/18/2020, 10:42 AM

## 2020-10-19 LAB — ECHOCARDIOGRAM COMPLETE
Area-P 1/2: 3.17 cm2
P 1/2 time: 378 msec
S' Lateral: 1.6 cm

## 2020-11-01 ENCOUNTER — Other Ambulatory Visit: Payer: Self-pay | Admitting: Internal Medicine

## 2020-11-01 DIAGNOSIS — I10 Essential (primary) hypertension: Secondary | ICD-10-CM

## 2020-11-17 ENCOUNTER — Telehealth: Payer: Self-pay

## 2020-11-17 ENCOUNTER — Other Ambulatory Visit: Payer: Self-pay | Admitting: Student

## 2020-11-17 DIAGNOSIS — R000 Tachycardia, unspecified: Secondary | ICD-10-CM

## 2020-11-17 DIAGNOSIS — I10 Essential (primary) hypertension: Secondary | ICD-10-CM

## 2020-11-17 DIAGNOSIS — G250 Essential tremor: Secondary | ICD-10-CM

## 2020-11-17 NOTE — Telephone Encounter (Signed)
Pt is requesting her propranolol ER (INDERAL LA) 80 MG 24 hr capsule sent to  [facility] ([place]), [place] - [address] Phone:  [phone]  Fax:  [phone]

## 2020-11-17 NOTE — Telephone Encounter (Signed)
This RX request was received via surescripts and forwarded to MD. Laurence Compton, RN,BSN

## 2020-12-11 ENCOUNTER — Encounter: Payer: Self-pay | Admitting: *Deleted

## 2020-12-11 NOTE — Progress Notes (Signed)

## 2020-12-12 NOTE — Progress Notes (Signed)
Things That May Be Affecting Your Health:  Alcohol  Hearing loss  Pain   X Depression XXXX Home Safety  Sexual Health   Diabetes  Lack of physical activity X Stress   Difficulty with daily activities X Loneliness X Tiredness   Drug use  Medicines  Tobacco use   Falls  Motor Vehicle Safety X Weight  X Food choices  Oral Health  Other    YOUR PERSONALIZED HEALTH PLAN : 1. Schedule your next subsequent Medicare Wellness visit in one year 2. Attend all of your regular appointments to address your medical issues 3. Complete the preventative screenings and services   Annual Wellness Visit   Medicare Covered Preventative Screenings and [place] Men and Women Who How Often Need? Date of Last Service Action  Abdominal Aortic Aneurysm Adults with AAA risk factors Once      Alcohol Misuse and Counseling All Adults Screening once a year if no alcohol misuse. Counseling up to 4 face to face sessions.     Bone Density Measurement  Adults at risk for osteoporosis Once every 2 yrs      Lipid Panel Z13.6 All adults without CV disease Once every 5 yrs       Colorectal Cancer   Stool sample or  Colonoscopy All adults 67 and older   Once every year  Every 10 years        Depression All Adults Once a year  Today   Diabetes Screening Blood glucose, post glucose load, or GTT Z13.1  All adults at risk  Pre-diabetics  Once per year  Twice per year      Diabetes  Self-Management Training All adults Diabetics 10 hrs first year; 2 hours subsequent years. Requires Copay     Glaucoma  Diabetics  Family history of glaucoma  African Americans 68 yrs +  Hispanic Americans 57 yrs + Annually - requires coppay      Hepatitis C Z72.89 or F19.20  High Risk for HCV  Born between 1945 and 1965  Annually  Once x     HIV Z11.4 All adults based on risk  Annually btw ages 24 & 29 regardless of risk  Annually > 65 yrs if at increased risk      Lung Cancer  Screening Asymptomatic adults aged 82-77 with 30 pack yr history and current smoker OR quit within the last 15 yrs Annually Must have counseling and shared decision making documentation before first screen      Medical Nutrition Therapy Adults with   Diabetes  Renal disease  Kidney transplant within past 3 yrs 3 hours first year; 2 hours subsequent years     Obesity and Counseling All adults Screening once a year Counseling if BMI 30 or higher  Today   Tobacco Use Counseling Adults who use tobacco  Up to 8 visits in one year     Vaccines Z23  Hepatitis B  Influenza   Pneumonia  Adults   Once  Once every flu season  Two different vaccines separated by one year X    Next Annual Wellness Visit People with Medicare Every year  Today     Services & Screenings Women Who How Often Need  Date of Last Service Action  Mammogram  Z12.31 Women over 33 One baseline ages 33-39. Annually ager 40 yrs+      Pap tests All women Annually if high risk. Every 2 yrs for normal risk women  Screening for cervical cancer with   Pap (Z01.419 nl or Z01.411abnl) &  HPV Z11.51 Women aged 61 to 20 Once every 5 yrs     Screening pelvic and breast exams All women Annually if high risk. Every 2 yrs for normal risk women     Sexually Transmitted Diseases  Chlamydia  Gonorrhea  Syphilis All at risk adults Annually for non pregnant females at increased risk         [place] Men Who How Ofter Need  Date of Last Service Action  Prostate Cancer - DRE & PSA Men over 50 Annually.  DRE might require a copay.        Sexually Transmitted Diseases  Syphilis All at risk adults Annually for men at increased risk      Health Maintenance List Health Maintenance  Topic Date Due  . COVID-19 Vaccine (1) Never done  . Hepatitis C Screening  Never done  . TETANUS/TDAP  Never done  . INFLUENZA VACCINE  03/06/2021  . DEXA SCAN  Completed  . PNA vac Low Risk Adult  Completed  . HPV  VACCINES  Aged Out

## 2020-12-19 ENCOUNTER — Other Ambulatory Visit: Payer: Self-pay | Admitting: Student

## 2020-12-19 DIAGNOSIS — I10 Essential (primary) hypertension: Secondary | ICD-10-CM

## 2020-12-24 ENCOUNTER — Encounter: Payer: Self-pay | Admitting: *Deleted

## 2021-01-02 ENCOUNTER — Other Ambulatory Visit: Payer: Self-pay | Admitting: Student

## 2021-01-06 NOTE — Telephone Encounter (Signed)
Appt sch for 02/16/2021 @ 10:15 am with her PCP.

## 2021-02-02 ENCOUNTER — Other Ambulatory Visit: Payer: Self-pay

## 2021-02-02 MED ORDER — DULOXETINE HCL 20 MG PO CPEP: 20.0000 mg | ORAL_CAPSULE | Freq: Every day | ORAL | 0 refills | Status: DC

## 2021-02-16 ENCOUNTER — Ambulatory Visit (INDEPENDENT_AMBULATORY_CARE_PROVIDER_SITE_OTHER): Payer: Medicare Other | Admitting: Internal Medicine

## 2021-02-16 VITALS — BP 133/58 | HR 80 | Temp 97.7°F | Ht 60.0 in | Wt 114.9 lb

## 2021-02-16 DIAGNOSIS — Z0000 Encounter for general adult medical examination without abnormal findings: Secondary | ICD-10-CM | POA: Diagnosis not present

## 2021-02-16 DIAGNOSIS — E7841 Elevated Lipoprotein(a): Secondary | ICD-10-CM | POA: Diagnosis not present

## 2021-02-16 DIAGNOSIS — R739 Hyperglycemia, unspecified: Secondary | ICD-10-CM | POA: Diagnosis not present

## 2021-02-16 DIAGNOSIS — R0789 Other chest pain: Secondary | ICD-10-CM | POA: Diagnosis not present

## 2021-02-16 DIAGNOSIS — M810 Age-related osteoporosis without current pathological fracture: Secondary | ICD-10-CM

## 2021-02-16 DIAGNOSIS — I10 Essential (primary) hypertension: Secondary | ICD-10-CM | POA: Diagnosis not present

## 2021-02-16 DIAGNOSIS — F419 Anxiety disorder, unspecified: Secondary | ICD-10-CM | POA: Diagnosis not present

## 2021-02-16 DIAGNOSIS — H6121 Impacted cerumen, right ear: Secondary | ICD-10-CM

## 2021-02-16 DIAGNOSIS — R000 Tachycardia, unspecified: Secondary | ICD-10-CM

## 2021-02-16 MED ORDER — DEBROX 6.5 % OT SOLN: 5.0000 [drp] | Freq: Two times a day (BID) | OTIC | 2 refills | Status: AC

## 2021-02-16 NOTE — Patient Instructions (Signed)
Ms. Brunetti, It was wonderful to meet you today!  I'm so happy to hear that you're feeling better after the devastating loss of your son.  Grieving takes time.  It helps to be living with your daughter so that you can lean on each other.  Your blood pressure looks great and I don't have any suggestions for changes today.  We talked about the importance of preventing falls because of your risk of breaking bones from osteoporosis.  You shared with me that you were not interested in treatment at this time.  Please continue to take your vitamin D, which is important.  I will see you in 3-6 months.  You'll be due for a flu shot this Fall.  Take care and stay well!  Dr. Jimmye Norman
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
## 2021-02-23 DIAGNOSIS — H6121 Impacted cerumen, right ear: Secondary | ICD-10-CM | POA: Insufficient documentation

## 2021-02-23 NOTE — Assessment & Plan Note (Signed)
Anxiety and grief have improved considerably with initiation of low dose duloxetine.  She is pleased and doesn't feel that an increased dose is needed.  Continue and monitor.

## 2021-02-23 NOTE — Assessment & Plan Note (Signed)
Ionized calcium is normal

## 2021-02-23 NOTE — Assessment & Plan Note (Signed)
A1c 5.5 10/03/20.

## 2021-02-23 NOTE — Assessment & Plan Note (Signed)
133/58, adequate control for now on current therapy.  Home BPs are controlled.

## 2021-02-23 NOTE — Assessment & Plan Note (Signed)
Propranolol started in 2021.  HR currently controlled.  Continue and monitor.

## 2021-02-23 NOTE — Assessment & Plan Note (Signed)
Long talk about the importance of fall prevention given risk of fracture.  She is aware.  Takes vit D.  Does not wish to take bisphosphonate after discussion of risks and benefits.

## 2021-02-23 NOTE — Assessment & Plan Note (Signed)
No further episodes.  EKG unremarkable and echo in 10/2020 normal.  SHe will continue to monitor and report symptoms.

## 2021-02-23 NOTE — Assessment & Plan Note (Signed)
At next visit will offer flu, shingles, and TDAP vaccinations.

## 2021-02-23 NOTE — Assessment & Plan Note (Signed)
Due for recheck which didn't occur this visit; f/u this Fall.

## 2021-03-10 ENCOUNTER — Other Ambulatory Visit: Payer: Self-pay | Admitting: *Deleted

## 2021-03-14 ENCOUNTER — Telehealth: Payer: Self-pay

## 2021-03-14 ENCOUNTER — Other Ambulatory Visit: Payer: Self-pay | Admitting: Internal Medicine

## 2021-03-14 DIAGNOSIS — G250 Essential tremor: Secondary | ICD-10-CM

## 2021-03-14 DIAGNOSIS — R000 Tachycardia, unspecified: Secondary | ICD-10-CM

## 2021-03-14 DIAGNOSIS — I10 Essential (primary) hypertension: Secondary | ICD-10-CM

## 2021-03-14 MED ORDER — DULOXETINE HCL 20 MG PO CPEP: 20.0000 mg | ORAL_CAPSULE | Freq: Every day | ORAL | 3 refills | Status: DC

## 2021-03-14 NOTE — Telephone Encounter (Signed)
Pt is requesting her DULoxetine (CYMBALTA) 20 MG capsule sent to  [facility] ([place]), [place] - [address] Phone:  [phone]  Fax:  [phone]     ( Pt stated that her pharmacy told her that they have been sending requesting the medicine and has heard nothing back .. pt is out of her medicine for two days now )

## 2021-06-01 ENCOUNTER — Encounter: Payer: Medicare Other | Admitting: Internal Medicine

## 2021-06-13 ENCOUNTER — Other Ambulatory Visit: Payer: Self-pay | Admitting: Internal Medicine

## 2021-06-13 DIAGNOSIS — I10 Essential (primary) hypertension: Secondary | ICD-10-CM

## 2021-06-13 NOTE — Telephone Encounter (Signed)
Next appt scheduled 07/11/22 with PCP. 

## 2021-06-15 ENCOUNTER — Ambulatory Visit (INDEPENDENT_AMBULATORY_CARE_PROVIDER_SITE_OTHER): Payer: Medicare Other | Admitting: Student

## 2021-06-15 VITALS — BP 131/71 | HR 101 | Wt 105.9 lb

## 2021-06-15 DIAGNOSIS — E785 Hyperlipidemia, unspecified: Secondary | ICD-10-CM | POA: Diagnosis not present

## 2021-06-15 DIAGNOSIS — Z0000 Encounter for general adult medical examination without abnormal findings: Secondary | ICD-10-CM

## 2021-06-15 DIAGNOSIS — F419 Anxiety disorder, unspecified: Secondary | ICD-10-CM

## 2021-06-15 DIAGNOSIS — I10 Essential (primary) hypertension: Secondary | ICD-10-CM | POA: Diagnosis not present

## 2021-06-15 NOTE — Assessment & Plan Note (Addendum)
Lipid panel repeated today. Pending results.   Addendum:  Continues to have elevated total cholesterol, LDL, and triglyceride levels. Will forward results to PCP per her request.
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
## 2021-06-15 NOTE — Assessment & Plan Note (Signed)
Flu vaccine given today. 

## 2021-06-15 NOTE — Patient Instructions (Signed)
Thank you, Ms.Colleen Can for allowing [place] to provide your care today. Today we discussed .  Blood Pressure You are doing a great job, please keep up the good work and check your blood pressures weekly.   Preventative medicine We will be giving you the flu shot today  If you ever need anything call the clinic and if you feel as though you are having a medical emergency, please call 911.     I have ordered the following labs for you:   Lab Orders         Lipid Profile       Referrals ordered today:   Referral Orders  No referral(s) requested today     I have ordered the following medication/changed the following medications:   Stop the following medications: There are no discontinued medications.   Start the following medications: No orders of the defined types were placed in this encounter.    Follow up: 3 months    Should you have any questions or concerns please call the internal medicine clinic at [phone].    Sanjuana Letters, D.O. [place]

## 2021-06-15 NOTE — Progress Notes (Addendum)
CC: Follow up blood pressure  HPI:  [name] is a 80 y.o. female with a past medical history stated below and presents today for follow up blood pressure and discuss preventive health measures. Please see problem based assessment and plan for additional details.  Past Medical History:  Diagnosis Date   Angioedema    secondary to niacin   Anxiety    GERD (gastroesophageal reflux disease)    History of melanoma    status post excision of lesion 2006, dermatology  re-eva 2011   Hypercalcemia    possibloe association with HCTZ but patient wants to continue diuretic   Hyperlipidemia     statin intolerance severe, myopathy   Hypertension    Hypomagnesemia    secondary to bisphosphonate IV   Intravenous bisphosphonates causing adverse effect in therapeutic use    Glu-like illness and electrolyte disturbance   Osteoporosis    Raynaud's phenomenon    negative ANA, ESR    Current Outpatient Medications on File Prior to Visit  Medication Sig Dispense Refill   amLODipine (NORVASC) 10 MG tablet Take 1 tablet by mouth once daily with breakfast 90 tablet 2   aspirin 81 MG tablet Take 81 mg by mouth daily.       Cholecalciferol (VITAMIN D-3 PO) Take 2 tablets by mouth daily.      DULoxetine (CYMBALTA) 20 MG capsule Take 1 capsule (20 mg total) by mouth daily. 90 capsule 3   ezetimibe (ZETIA) 10 MG tablet Take 1 tablet (10 mg total) by mouth daily. 90 tablet 3   famotidine (PEPCID) 20 MG tablet Take 20 mg by mouth 2 (two) times daily. Take in AM and PM      lisinopril (ZESTRIL) 40 MG tablet TAKE 1 TABLET (40 MG TOTAL) BY MOUTH DAILY WITH SUPPER. 90 tablet 3   Multiple Vitamin (MULTIVITAMIN) tablet Take 1 tablet by mouth daily.       Omega 3-6-9 Fatty Acids (OMEGA-3 & OMEGA-6 FISH OIL PO) Take 1 tablet by mouth daily. Reported on 01/24/2016     propranolol ER (INDERAL LA) 80 MG 24 hr capsule Take 1 capsule by mouth once daily 90 capsule 3   Wheat Dextrin (BENEFIBER) TABS Take 1  tablet by mouth 2 (two) times daily. Reported on 01/24/2016     No current facility-administered medications on file prior to visit.    Family History  Problem Relation Age of Onset   Heart disease Mother    Hypertension Mother    Heart disease Father    Cancer Brother 71       prostate and lung    Social History   Socioeconomic History   Marital status: Single    Spouse name: Not on file   Number of children: Not on file   Years of education: Not on file   Highest education level: Not on file  Occupational History   Occupation: retired    Fish farm manager: RETIRED  Tobacco Use   Smoking status: Former    Packs/day: 0.25    Years: 5.00    Pack years: 1.25    Types: Cigarettes    Quit date: 05/06/2010    Years since quitting: 11.1   Smokeless tobacco: Never  Substance and Sexual Activity   Alcohol use: No    Alcohol/week: 0.0 standard drinks   Drug use: No   Sexual activity: Never  Other Topics Concern   Not on file  Social History Narrative   Not on file  Social Determinants of Health   Financial Resource Strain: Not on file  Food Insecurity: Not on file  Transportation Needs: Not on file  Physical Activity: Not on file  Stress: Not on file  Social Connections: Not on file  Intimate Partner Violence: Not on file    Review of Systems: ROS negative except for what is noted on the assessment and plan.  Vitals:   06/15/21 1312  BP: 131/71  Pulse: (!) 101  SpO2: 99%  Weight: 105 lb 14.4 oz (48 kg)   Physical Exam: Constitutional: thin, elderly appearing HENT: normocephalic atraumatic, mucous membranes moist Eyes: conjunctiva non-erythematous Neck: supple Cardiovascular: regular rate and rhythm, early systolic murmur without radiation Pulmonary/Chest: normal work of breathing on room air MSK: normal bulk and tone Neurological: alert & oriented x 3 Skin: warm and dry Psych: normal mood and thought process  Assessment & Plan:   See Encounters Tab for  problem based charting.  Patient discussed with Dr. Caffie Damme, D.O. [facility], PGY-2 Pager: [phone], Phone: [phone] Date 06/15/2021 Time 2:10 PM

## 2021-06-15 NOTE — Assessment & Plan Note (Signed)
Assessment: BP 131/71. Patient brought in home BP records, systolic is between 301-415 and diastolic 97-33. Patient's BP well controlled.  Plan: -continue current regimen of amlodipine 10 mg daily, lisinopril 40 mg daily

## 2021-06-16 LAB — LIPID PANEL
Chol/HDL Ratio: 4.7 ratio — ABNORMAL HIGH (ref 0.0–4.4)
Cholesterol, Total: 269 mg/dL — ABNORMAL HIGH (ref 100–199)
HDL: 57 mg/dL (ref >39)
LDL Chol Calc (NIH): 177 mg/dL — ABNORMAL HIGH (ref 0–99)
Triglycerides: 189 mg/dL — ABNORMAL HIGH (ref 0–149)
VLDL Cholesterol Cal: 35 mg/dL (ref 5–40)

## 2021-06-16 NOTE — Progress Notes (Signed)
Internal Medicine Clinic Attending  Case discussed with Dr. Katsadouros  At the time of the visit.  We reviewed the resident's history and exam and pertinent patient test results.  I agree with the assessment, diagnosis, and plan of care documented in the resident's note.  

## 2021-07-03 ENCOUNTER — Encounter: Payer: Self-pay | Admitting: *Deleted

## 2021-08-18 ENCOUNTER — Encounter: Payer: Self-pay | Admitting: Internal Medicine

## 2021-08-18 ENCOUNTER — Ambulatory Visit (INDEPENDENT_AMBULATORY_CARE_PROVIDER_SITE_OTHER): Payer: Medicare Other | Admitting: Internal Medicine

## 2021-08-18 ENCOUNTER — Ambulatory Visit: Payer: Self-pay | Admitting: Licensed Clinical Social Worker

## 2021-08-18 VITALS — BP 126/57 | HR 79 | Temp 97.6°F | Ht 60.0 in | Wt 111.6 lb

## 2021-08-18 DIAGNOSIS — F4321 Adjustment disorder with depressed mood: Secondary | ICD-10-CM

## 2021-08-18 DIAGNOSIS — I10 Essential (primary) hypertension: Secondary | ICD-10-CM

## 2021-08-18 DIAGNOSIS — Z639 Problem related to primary support group, unspecified: Secondary | ICD-10-CM | POA: Diagnosis not present

## 2021-08-18 DIAGNOSIS — E7841 Elevated Lipoprotein(a): Secondary | ICD-10-CM | POA: Diagnosis not present

## 2021-08-18 DIAGNOSIS — Z634 Disappearance and death of family member: Secondary | ICD-10-CM

## 2021-08-18 MED ORDER — AMLODIPINE BESYLATE 10 MG PO TABS: 10.0000 mg | ORAL_TABLET | Freq: Every day | ORAL | 3 refills | Status: DC

## 2021-08-18 NOTE — Progress Notes (Signed)
Elizabeth Elizabeth Woodward is here today expressing concern about her living situation.  To recap, she had been living with her son in the same rental home for many many years until their eviction about a year ago when the property changed ownership.  At about the same time, the son died from cancer.  She has not grieved in a healthy way.  Because she had nowhere else to go, she moved in with her daughter, with whom she has a strained relationship.  Elizabeth Elizabeth Woodward feels that her daughter does not want her in the home, and that she is doing everything possible to "drive me crazy".  She has belittled and made to feel unwanted though she has not been physically harmed.  Her daughter states that Elizabeth Elizabeth Woodward is crazy and is the problem.  Elizabeth Elizabeth Woodward does not drive-her daughter will take her to the grocery store, she cooks her own meals, and takes care of her self.  She manages her own bank account and daughter expects payment for bills.  Elizabeth Elizabeth Woodward is tearful during our encounter and says "you have to help me, I worried about what she might do".  She has no significant finances.  No other relatives with whom she could move in.  She lives on Social Security checks and does not have Medicaid.  The stress of the situation is causing her to lose sleep and to be fearful around the clock.  Very recently she says her daughter threw her bed outside for no apparent reason-Elizabeth Elizabeth Woodward has been sleeping on the sofa.  There appears to be some dysfunctional family relationships which she does not detail today.  The home in which she and her daughter are living is a home owned by Elizabeth Elizabeth Woodward.  This Elizabeth Woodward resides in another home with her husband (I believe).  Elizabeth Elizabeth Woodward does not have a good relationship with the Elizabeth Woodward, though her daughter apparently does.  Elizabeth Elizabeth Woodward does not feel that she can confide in her Elizabeth Woodward.  Elizabeth Elizabeth Woodward feels that she needs to move out soon.  She does not feel capable of living alone, as she never  has.  She does not know how to proceed with Medicaid application (I am not smart, I do not know how to do any of that).  She visualizes living in an assisted living or some other long-term care.  She has no skilled needs.  For step would be applying for Medicaid.  Elizabeth Elizabeth Woodward states that she cannot have private phone calls and that she would not be able to do counseling by phone.  We agreed that she would return in 2 weeks time to review today's blood work, at which time our Education officer, museum and behavioral  health specialist will be in the office and we can together talk about some options.  At this time, Elizabeth Elizabeth Woodward does not want to proceed with an APS referral.  She is afraid of the repercussions.  When asked if she felt physically safe to return home, she stated yes, though asked what she should do if her daughter became dangerous.  I told her that she must call 911.  54 minutes was spent face to face with patient in discussion and providing counseling.

## 2021-08-18 NOTE — Chronic Care Management (AMB) (Signed)
°  Care Management   Social Work Visit Note  08/18/2021 Name: Elizabeth Woodward MRN: [id number] DOB: [birth year]  Elizabeth Woodward is a 81 y.o. year old female who sees Jimmye Norman, Elaina Pattee, MD for primary care. The care management team was consulted for assistance with care management and care coordination needs related to Level of Care Concerns   Received message from Dr. Dorian Pod. SW opened patients chart to review.

## 2021-08-18 NOTE — Patient Instructions (Signed)
Elizabeth Woodward, It was good to catch up with you today!  We discussed your medicines, and we are updating your annual bloodwork today.  I'm sorry I was running so far behind, and that you had to be here for the entire morning!   Let's get together again in 2 weeks to review your test results and talk about some of the medical problems we didn't have a chance to address today.  Your blood pressure looks great, and I thank you for bringing in some readings for review.   Take care and stay well, Dr. Jimmye Norman

## 2021-08-18 NOTE — Progress Notes (Deleted)
Elizabeth Woodward, It was good to catch up with you today!  We discussed your medicines, and we are updating your annual bloodwork today.  I'm sorry I was running so far behind, and that you had to be here for the entire morning!  Let's get together again in 2 weeks to review your test results and talk about some of the medical problems we didn't have a chance to address today.  Your blood pressure looks great, and I thank you for bringing in some readings for review.  Take care and stay well, Dr. Jimmye Norman

## 2021-08-19 LAB — CMP14 + ANION GAP
ALT: 5 IU/L (ref 0–32)
AST: 11 IU/L (ref 0–40)
Albumin/Globulin Ratio: 1.7 (ref 1.2–2.2)
Albumin: 4.8 g/dL — ABNORMAL HIGH (ref 3.7–4.7)
Alkaline Phosphatase: 114 IU/L (ref 44–121)
Anion Gap: 18 mmol/L (ref 10.0–18.0)
BUN/Creatinine Ratio: 14 (ref 12–28)
BUN: 14 mg/dL (ref 8–27)
Bilirubin Total: 0.2 mg/dL (ref 0.0–1.2)
CO2: 21 mmol/L (ref 20–29)
Calcium: 10.5 mg/dL — ABNORMAL HIGH (ref 8.7–10.3)
Chloride: 95 mmol/L — ABNORMAL LOW (ref 96–106)
Creatinine, Ser: 0.99 mg/dL (ref 0.57–1.00)
Globulin, Total: 2.9 g/dL (ref 1.5–4.5)
Glucose: 120 mg/dL — ABNORMAL HIGH (ref 70–99)
Potassium: 4.7 mmol/L (ref 3.5–5.2)
Sodium: 134 mmol/L (ref 134–144)
Total Protein: 7.7 g/dL (ref 6.0–8.5)
eGFR: 58 mL/min/{1.73_m2} — ABNORMAL LOW (ref >59)

## 2021-08-19 LAB — LIPID PANEL
Chol/HDL Ratio: 4.1 ratio (ref 0.0–4.4)
Cholesterol, Total: 253 mg/dL — ABNORMAL HIGH (ref 100–199)
HDL: 62 mg/dL (ref >39)
LDL Chol Calc (NIH): 158 mg/dL — ABNORMAL HIGH (ref 0–99)
Triglycerides: 183 mg/dL — ABNORMAL HIGH (ref 0–149)
VLDL Cholesterol Cal: 33 mg/dL (ref 5–40)

## 2021-08-24 ENCOUNTER — Telehealth: Payer: Self-pay | Admitting: *Deleted

## 2021-08-24 ENCOUNTER — Encounter: Payer: Self-pay | Admitting: Internal Medicine

## 2021-08-24 NOTE — Chronic Care Management (AMB) (Signed)
°  Care Management   Note  08/24/2021 Name: BIANCE MONCRIEF MRN: [id number] DOB: 12-18-1940  PAYSLIE MCCAIG is a 81 y.o. year old female who is a primary care patient of Angelica Pou, MD. I reached out to Colleen Can by phone today in response to a referral sent by Ms. Tiburcio Bash Scalise's primary care provider.   Ms. Neyer was given information about care management services today including:  Care management services include personalized support from designated clinical staff supervised by her physician, including individualized plan of care and coordination with other care providers 24/7 contact phone numbers for assistance for urgent and routine care needs. The patient may stop care management services at any time by phone call to the office staff.  Patient agreed to services and verbal consent obtained.   Follow up plan: Face to Face appointment with care management team member scheduled for: 08/31/21  [facility]  Direct Dial: [phone]

## 2021-08-31 ENCOUNTER — Encounter: Payer: Medicare Other | Admitting: Internal Medicine

## 2021-08-31 ENCOUNTER — Other Ambulatory Visit: Payer: Self-pay

## 2021-08-31 ENCOUNTER — Ambulatory Visit: Payer: Medicare Other | Admitting: Licensed Clinical Social Worker

## 2021-08-31 ENCOUNTER — Ambulatory Visit (INDEPENDENT_AMBULATORY_CARE_PROVIDER_SITE_OTHER): Payer: Medicare Other | Admitting: Internal Medicine

## 2021-08-31 DIAGNOSIS — E7841 Elevated Lipoprotein(a): Secondary | ICD-10-CM

## 2021-08-31 MED ORDER — ROSUVASTATIN CALCIUM 5 MG PO TABS: 5.0000 mg | ORAL_TABLET | Freq: Every day | ORAL | 5 refills | Status: DC

## 2021-08-31 NOTE — Patient Instructions (Signed)
Visit Information  Instructions: patient will work with SW to address concerns related to [facility] Application  Patient was given the following information about care management and care coordination services today, agreed to services, and gave verbal consent: 1.care management/care coordination services include personalized support from designated clinical staff supervised by their physician, including individualized plan of care and coordination with other care providers 2. 24/7 contact phone numbers for assistance for urgent and routine care needs. 3. The patient may stop care management/care coordination services at any time by phone call to the office staff.  Patient verbalizes understanding of instructions and care plan provided today and agrees to view in [place]. Active MyChart status confirmed with patient.    The care management team will reach out to the patient again over the next 60 days.  Milus Height, [place]  Social Worker [facility] Care Management  [phone]

## 2021-08-31 NOTE — Patient Instructions (Signed)
Thank you for coming in this morning, Elizabeth Woodward.  We discussed your high cholesterol and the challenge in controlling it since your experienced side effects from the usual medications.  Since it has been a long time since you last tried a medicine, I'd like you to give a low dose a try.  Please let me know how it goes, and come back in about 3 months.  Take care and stay well!

## 2021-08-31 NOTE — Chronic Care Management (AMB) (Signed)
°  Care Management   Social Work Visit Note  08/31/2021 Name: Elizabeth Woodward MRN: [id number] DOB: 12/10/1940  Elizabeth Woodward is a 81 y.o. year old female who sees Jimmye Norman, Elaina Pattee, MD for primary care. The care management team was consulted for assistance with care management and care coordination needs related to  Initial Visit.    Patient was given the following information about care management and care coordination services today, agreed to services, and gave verbal consent: 1.care management/care coordination services include personalized support from designated clinical staff supervised by their physician, including individualized plan of care and coordination with other care providers 2. 24/7 contact phone numbers for assistance for urgent and routine care needs. 3. The patient may stop care management/care coordination services at any time by phone call to the office staff.  Engaged with patient by telephone for initial visit in response to provider referral for social work chronic care management and care coordination services.  Assessment: Review of patient history, allergies, and health status during evaluation of patient need for care management/care coordination services.    Interventions:  Patient interviewed and appropriate assessments performed Collaborated with clinical team regarding patient needs  SW outreach patient and informed patient she would benefit from [place]. SW will assist on medicaid application for patient.    SDOH (Social Determinants of Health) assessments performed: Yes     Plan:  SW will assist patient with applying for medicaid.   Milus Height, [place]  Social Worker [facility] Care Management  [phone]

## 2021-08-31 NOTE — Progress Notes (Signed)
Elizabeth Woodward is here for close f/u with intention of introducing Elizabeth to [name] and potentially to therapist [name] (working remotely today, is available for a phone introduction conducted from clinic this am).  Elizabeth Woodward states that Elizabeth Woodward "has eased up some" but isn't specific.  The letter Elizabeth Woodward received with Elizabeth appt with Elizabeth Woodward in Feb was opened by Elizabeth Woodward before it was given to [name].    Elizabeth Woodward was tremulous today "I'm nervous I'm just shaking".  Again she states that she has not been physically harmed.  She was able to meet independently with Elizabeth Woodward who will be helping Elizabeth apply for Medicaid.  Elizabeth Woodward was not on campus today but has a visit schedule with Elizabeth Woodward early next month.  I spoke to Elizabeth Woodward about Elizabeth high cholesterol - she stated that she wasn't interested in starting a new medicine.  I'll cancel the prescription.  RTC 45M, though she will be in touch with our [name] and Elizabeth staff.

## 2021-09-01 NOTE — Assessment & Plan Note (Signed)
LDL remains very elevated.  Medication recommended though she is firm in stating she doesn't wish to start a new medicine.  Rosuvastatin prescription cancelled.

## 2021-09-14 ENCOUNTER — Ambulatory Visit: Payer: Medicare Other | Admitting: Behavioral Health

## 2021-09-14 DIAGNOSIS — Z639 Problem related to primary support group, unspecified: Secondary | ICD-10-CM

## 2021-09-14 DIAGNOSIS — F4321 Adjustment disorder with depressed mood: Secondary | ICD-10-CM

## 2021-09-14 DIAGNOSIS — T7691XA Unspecified adult maltreatment, suspected, initial encounter: Secondary | ICD-10-CM

## 2021-09-14 DIAGNOSIS — F419 Anxiety disorder, unspecified: Secondary | ICD-10-CM

## 2021-09-14 DIAGNOSIS — F331 Major depressive disorder, recurrent, moderate: Secondary | ICD-10-CM

## 2021-09-19 NOTE — BH Specialist Note (Signed)
Integrated Behavioral Health via Telemedicine Visit  09/19/2021 KELAIAH ESCALONA [id number]  Number of [facility] Clinician visits: 1/6 Session Start time: 10:00am Session End time: 11:00am Total time in minutes: 60 min  Referring Provider: Dr. Dorian Pod, MD Patient/Family location: Pt is in [facility] Office for telehealth session. Her Dtr is not present.  [facility] Provider location: Working remotely All persons participating in visit: Pt & Clinician Types of Service: Individual psychotherapy  I connected with Colleen Can and/or Tiburcio Bash Mackert's  self  via  Telephone or [employer]  (Video is Caregility application) and verified that I am speaking with the correct person using two identifiers. Discussed confidentiality: Yes   I discussed the limitations of telemedicine and the availability of in person appointments.  Discussed there is a possibility of technology failure and discussed alternative modes of communication if that failure occurs.  I discussed that engaging in this telemedicine visit, they consent to the provision of behavioral healthcare and the services will be billed under their insurance.  Patient and/or legal guardian expressed understanding and consented to Telemedicine visit: Yes   Presenting Concerns: Patient and/or family reports the following symptoms/concerns: elevated anx/dep & despair over Dtr's Tx of Pt for the past year while she has resided w/her due to housing instability. Pt's 39yo Son Belinda Block died a year ago & Pt has been unable to grieve his loss. He was in Hospice care for 3 wks prior to his death due to cancer & COPD.  There is a strong medical Hx for Pt. She has 3 Str's that are all sick. Two Strs have cardiac issues a/o cancer of the rectum. The Baby Str has breathing issues & DM.   Pt describes her Dtr Suzie as "changing personalities-going from nice to mean in an instant". 30 yrs ago, her Dtr cared  for a sick Husb until he died.   Pt reports her Cymbalta helps her anx until her Dtr is harsh w/her. Pt denies Px abuse, but agrees she is emot'ly reckless. Pt reports she is miserable day to day as her Dtr has kicked her out of the bedroom she was occupying & gotten rid of her things. This occurred 3 wks ago. She now sleeps on the sofa & has no privacy.  Pt sts she feels, "unsafe w/her Dtr @ times", but would not elaborate, denying she wants to report her dtr or cause trouble for her. Dtr opens her Mother's mail. This is consistent w/lack of privacy. Dtr tells Pt, "You're not going anywhere Mom." Dtr locks the back door w/a bolt & Pt can only leave through the front door. No one visits them.  Pt is independent & showers alone, dresses herself, fixes her own food, & they go to the store tgthr for necessities.  SW Milus Height is assisting Pt to complete her [employer]. She wants her own housing & does not want to see her Dtr after she moves.   Duration of problem: approximately one year living w/her Dtr; Severity of problem: moderate  Patient and/or Family's Strengths/Protective Factors: Social and Emotional competence, Physical Health (exercise, healthy diet, medication compliance, etc.), and it is questionable whether Dtr may  compromise the safety & well being of her Mother.  Goals Addressed: Patient will:  Reduce symptoms of: anxiety, depression, stress, and highly emot'l state. Dtr tells her not to cry.    Increase knowledge and/or ability of: coping skills, stress reduction, and keeping self safe by using resources such  as 911 if she needs help    Demonstrate ability to: Increase healthy adjustment to current life circumstances, Increase adequate support systems for patient/family, and Begin healthy grieving over loss of 27yo Son Belinda Block last year  Progress towards Goals: Estb'd today; Pt will attend all psychotherapy sessions & use the suggested tools, coping skills, & self-care  practices to keep her mentally strong.  Interventions: Interventions utilized:  Solution-Focused Strategies, Mindfulness or Relaxation Training, Behavioral Activation, and Supportive Counseling Standardized Assessments completed:  screeners prn  Patient and/or Family Response: Pt is receptive today to telehealth call & requests future appts. Pt is anxious to get housing.  Assessment: Patient currently experiencing elevated emotionality & anx/dep due to her living circumstances w/her Dtr & the loss of her Son last year.   Patient may benefit from cont'd psychotherapy calls for mental health wellness & to process emotions of grief.   Pt needs [employer] resources to secure her own dwelling.   Plan: Follow up with behavioral health clinician on : Wed. 09/27/21 @ 3:00pm for 60 min f:f session Behavioral recommendations: Use emergency resources as needed. Remember our empowerment talk & how this will get better. Utilize your spiritual life prn & make friends w/the neighbors who are not loyal to your dtr. Referral(s): [place] (In Clinic) and [employer] Resources:  Finances and Housing  I discussed the assessment and treatment plan with the patient and/or parent/guardian. They were provided an opportunity to ask questions and all were answered. They agreed with the plan and demonstrated an understanding of the instructions.   They were advised to call back or seek an in-person evaluation if the symptoms worsen or if the condition fails to improve as anticipated.  Donnetta Hutching, LMFT

## 2021-09-27 ENCOUNTER — Institutional Professional Consult (permissible substitution): Payer: Medicare Other | Admitting: Behavioral Health

## 2021-10-06 ENCOUNTER — Other Ambulatory Visit: Payer: Self-pay | Admitting: Student

## 2021-10-11 ENCOUNTER — Ambulatory Visit: Payer: Medicare Other | Admitting: Behavioral Health

## 2021-10-11 DIAGNOSIS — F419 Anxiety disorder, unspecified: Secondary | ICD-10-CM

## 2021-10-11 DIAGNOSIS — F331 Major depressive disorder, recurrent, moderate: Secondary | ICD-10-CM

## 2021-10-16 NOTE — BH Specialist Note (Signed)
Integrated Behavioral Health Follow Up In-Person Visit ? ?MRN: [id number] ?Name: [name] ? ?Number of [place] Clinician visits: 2 ?Session Start time: 1500 pm ?Session End time: 1545 pm ?Total time in minutes: 45 min ? ?Types of Service: Individual psychotherapy ? ?Interpretor:No. Interpretor Name and Language: n/a ? ? ?Subjective: ?Elizabeth Woodward is a 81 y.o. female accompanied by  self; Dtr transports, but leaves to run errands ?Patient was referred by Dr. Dorian Pod, MD for mental health wellness & determination of support for Pt in tough living situation w/her older Dtr. ?Patient reports the following symptoms/concerns: Dtr is hateful to her often, she restricts where she sleeps, how often she will transport her, & will sometimes threaten her like she despises her being in the home per Pt report. Have evaluated whether Dtr would hit or be in any way violent to Pt. Pt denies. ? ?Pt sts she does not want any Report made to APS due to lack of wanting her Dtr in trouble, yet when/if she moves out of the home, Pt does not want to see her Dtr ever again.  ? ?Dtr opens Pt's mail & has thrown out her belongings & taped the doorway to her old bedroom per Pt report. Pt has her Soc Security benefit statements & her Bank Statements opened by her Dtr.  ? ?In Feb, Pt reports her Dtr took the 400.00 she pays in rent & did not give it to her other Dtr who owns the home. Pt paid an extra 150.00 for electric bill. The next mos she did not hand over the 400.00 @ the bank as Dtr asked & Dtr w/held a grocery trip. ? ?Duration of problem: one year & 3 mos since she moved in due to necessity; Severity of problem: moderate to "miserable everyday"  ? ?Objective: ?Mood: Anxious and Depressed and Affect: Appropriate ?Risk of harm to self or others: No plan to harm self or others ? ?Life Context: ?Family and Social: Pt lives w/her older Dtr 49 in her younger Dtr's home. Pt does not go out or participate  in any activities outside the home besides errands to the [employer]. ?School/Work: Pt does not work-she is ret'd & she does not attend school ?Self-Care: Pt cares for her own needs in the home. ?Life Changes: Pt lost her Son in 10-20-2020. He died after being in a [employer] for 2 wks. ? ?Pt would like to live in an [place] as soon as she is able to secure her [employer] funds pending Appl approval. Process w/Appl was begun w/the help of Milus Height, MSW initiated in late Jan 2023. ? ?Patient and/or Family's Strengths/Protective Factors: ?Social and Emotional competence, Concrete supports in place (healthy food, safe environments, etc.), Caregiver has knowledge of parenting & child development, and Parental Resilience. ? ?Pt is very decisive about her future plans to [place] as her residence.  ? ?Goals Addressed: ?Patient will: ? Reduce symptoms of: anxiety, depression, and stress  ? Increase knowledge and/or ability of: coping skills, healthy habits, self-management skills, and stress reduction  ? Demonstrate ability to: Increase healthy adjustment to current life circumstances and Increase adequate support systems for patient/family ? ?Progress towards Goals: ?Ongoing ? ?Interventions: ?Interventions utilized:  Solution-Focused Strategies and Supportive Counseling ?Standardized Assessments completed:  screeners prn ? ?Patient and/or Family Response: Pt is receptive to in person visits due to her need for privacy from her Dtr. Suzie w/whom she resides. ? ?  Patient Centered Plan: ?Patient is on the following Treatment Plan(s): Pt will need transportation to look @ Asst'd Living Facilities in order to promote her move out of her younger Dtr's home where she lives w/her older Dtr.  ?Assessment: ?Patient currently experiencing anx/dep & hurtfulness due to her Dtr's beh & speaking to her in unpleasant ways. Pt sleeps on the sofa in a 3 bedrm home. She does not have any  privacy. Pt is independent & does everything for herself besides driving.  ? ?Patient may benefit from cont'd sessions to monitor her mental health wellness & to communicate to her Dtr she has a tangible source of support @ her PCP's Office. ? ?Have discussed w/Pt that Clinician may likely be put in a position to make an APS Report. Pt is aware & highly disagrees w/this as she does not want to cause her Dtr any trouble. Pt loves her Dtr & cannot understand her treatment of Pt. Made Pt aware I will collaborate w/her PCP & decisions may need to be made @ a later date. Pt cont's to insist she is not in danger & knows to contact 911 if beh of Dtr bcms violent in any way.  ? ?Plan: ?Follow up with behavioral health clinician on : biwkly sessions for 60 min psychotherapy ?Behavioral recommendations: None today ?Referral(s): [place] (In Clinic) ?"From scale of 1-10, how likely are you to follow plan?": 7 for using 911 prn ? ?Donnetta Hutching, LMFT ? ? ?

## 2021-10-25 ENCOUNTER — Telehealth: Payer: Self-pay | Admitting: Behavioral Health

## 2021-10-25 ENCOUNTER — Institutional Professional Consult (permissible substitution): Payer: Medicare Other | Admitting: Behavioral Health

## 2021-10-25 NOTE — Telephone Encounter (Signed)
Contacted Pt by phone today. Pt has headache which has lasted 2 days. Pt is not able to have full privacy for session today either due to her Dtr being home. Told Pt Clinician will have another appt scheduled for when she feels better & can come into [facility]. ? ?Pt agreed. ? ?Dr. Theodis Shove ?

## 2021-11-08 ENCOUNTER — Ambulatory Visit: Payer: Medicare Other | Admitting: Behavioral Health

## 2021-11-08 DIAGNOSIS — F331 Major depressive disorder, recurrent, moderate: Secondary | ICD-10-CM

## 2021-11-08 DIAGNOSIS — F439 Reaction to severe stress, unspecified: Secondary | ICD-10-CM

## 2021-11-08 DIAGNOSIS — F419 Anxiety disorder, unspecified: Secondary | ICD-10-CM

## 2021-11-08 NOTE — BH Specialist Note (Addendum)
Integrated Behavioral Health Follow Up In-Person Visit ? ?MRN: [id number] ?Name: Elizabeth Woodward ? ?Number of [place] Clinician visits: 3 ?Session Start time:  1500 ?Session End time: 1600 ?Total time in minutes: 60 min ? ?Types of Service: Individual psychotherapy ? ?Interpretor:No. Interpretor Name and Language: n/a ? ? ?Subjective: ?Elizabeth Woodward is a 81 y.o. female accompanied by  self & transported by Dtr Elizabeth Woodward ?Patient was referred by Dr. Lenise Herald, MD for Eval & mental health wellness, & home stressors. ?Patient reports the following symptoms/concerns: dec in anx/dep & stress due to the change in attitude her older Dtr Elizabeth Woodward has displayed since our last f:f mtg on 10/11/2021. Pt sts after Clinician accompanied Pt to her Dtr's car for transport, they did not talk for 5 days; avoiding ea other. Since that time they have been getting along & things have, "been better". Pt's Dtr is calling her Elizabeth Woodward, speaking to her w/more kindness & respect, & she is taking her for errands she needs to accomplish (the [place]), saying, "You can stay here Mom".  ? ?Dtr related to Pt not to tell anyone what was going on in the home. Pt related incidents & events to this Clinician. Dtr did not realize her Mother would share this info w/anyone. When Clinician presented to the vehicle, dtr was upset & Pt sts, "No Doctor would come out w/you Mom". ?Duration of problem: since mid-late 2022; Severity of problem: moderate ? ?Objective: ?Mood:  neutral  and Affect: Appropriate ?Risk of harm to self or others: No plan to harm self or others ? ?Life Context: ?Family and Social: Pt lives w/oldest Dtr in her Str's home. Pt pays Dtr $400.00/month in rent. She also helps w/the electric bill & other assorted items. Pt does not have many friends & relies on her Dtr for transport.  ?School/Work: Pt does not work or attend school.  ?Self-Care: Pt is trying to tend to her own needs. ?Life Changes: Pt lost her 10yo Son  Elizabeth Woodward in Feb of 2022 after a battle w/his cancer & 2 wks in [place]. It is hurtful for Pt to recount his life & death. ? ?Patient and/or Family's Strengths/Protective Factors: ?Concrete supports in place (healthy food, safe environments, etc.), Sense of purpose, and Caregiver has knowledge of parenting & child development ? ?Goals Addressed: ?Patient will: ? Reduce symptoms of: anxiety, depression, and stress  ? Increase knowledge and/or ability of: coping skills, healthy habits, and stress reduction  ? Demonstrate ability to: Increase healthy adjustment to current life circumstances ? ?Progress towards Goals: ?Ongoing ? ?Interventions: ?Interventions utilized:  Solution-Focused Strategies and Supportive Counseling ?Standardized Assessments completed: Administration of the R-GPTS (Revised-Green Paranoid Thoughts Scale); score of Part A (Assessment of ideas of reference): 6 (Normal level of paranoia), score of Part B (Assessment of ideas of persecution): 5 (Normal level of paranoia) ? ?Patient and/or Family Response: Pt receptive to visit today reporting positive results from last f:f visit. Pt cont's to question her older Dtr's past beh. ? ?Patient Centered Plan: ?Patient is on the following Treatment Plan(s): Pt is slowly moving towards getting back in her bedroom. Her Dtr has c/o the smell in the house bc it aggrivates her allergies.  ?Assessment: ?Patient currently experiencing inc in her adjustment to living w/her older Dtr Elizabeth Woodward in Pt's Str's home. She presents as upbeat today, except when speaking about her deceased Elizabeth Woodward.  ? ?Pt able to grieve him some today once session proceeded. Pt finds it  anguishing to speak about her Son bc she misses him.  ? ?Patient may benefit from cont'd support from Cslg relationship. ? ?Plan: ?Follow up with behavioral health clinician on : 11/22/2021 @ 3:00pm for f:f psychotherapy ?Behavioral recommendations: None today ?Referral(s): [place] (In Clinic) ?"From scale of 1-10, how likely are you to follow plan?": 7 ? ?Donnetta Hutching, LMFT ? ? ?

## 2021-11-22 ENCOUNTER — Ambulatory Visit: Payer: Medicare Other | Admitting: Behavioral Health

## 2021-11-22 DIAGNOSIS — F331 Major depressive disorder, recurrent, moderate: Secondary | ICD-10-CM

## 2021-11-22 DIAGNOSIS — F419 Anxiety disorder, unspecified: Secondary | ICD-10-CM

## 2021-11-22 NOTE — BH Specialist Note (Signed)
Integrated Behavioral Health Follow Up In-Person Visit ? ?MRN: [id number] ?Name: Elizabeth Woodward ? ?Number of [place] Clinician visits: 4- Fourth Visit ? ?Session Start time: 0300 ?  ?Session End time: 4034 ? ?Total time in minutes: 50 ? ? ?Types of Service: Individual psychotherapy ? ?Interpretor:No. Interpretor Name and Language: n/a ? ? ?Subjective: ?Elizabeth Woodward is a 81 y.o. female accompanied by  self ?Patient was referred by Dr. Lenise Herald, MD for mental health wellness & reduction of home stressors living w/Elizabeth Woodward. Elizabeth Woodward. ?Patient reports the following symptoms/concerns: Pt sts things are better & have been the past 2 visits to see Clinician. Elizabeth Woodward is treating her better, calling her Mom, sitting & talking on the porch & probably returning Pt to one of the spare bedrooms so she can have privacy. Pt sts, "You scared my Elizabeth Woodward., she told me not to tell anyone & did not think I would. I told her I am seeing you to discuss my sadness over Elizabeth Woodward, but she knows you know how she treated me." ? ?With the reduction in Pt stress, she has had the opportunity to mourn & shed tears over the death of her 41yo Son in early 2022. She needs more time to tend to her feelings, & she is confident this can happen now.  ? ?Duration of problem: 15 mos of negativity & unfair treatment by Elizabeth Woodward Elizabeth Woodward; Severity of problem: moderate trending toward mild ? ?Objective: ?Mood: Anxious and Depressed and Affect: Appropriate ?Risk of harm to self or others: No plan to harm self or others ? ?Life Context: ?Family and Social: Pt lives w/her Elizabeth Woodward Elizabeth Woodward who transports her to appts & to get groceries or shop. ?School/Work: Pt does not work or attend School ?Self-Care: Pt is tending to her grief now. She thinks of her Son often & it saddens her. ?Life Changes: Pt has recently exp'd her Elizabeth Woodward's improved attitude & treatment of her. She loves her Elizabeth Woodward & knows she has been through a lot in her life. Pt requests to meet monthly to keep her  Elizabeth Woodward accountable.  ? ?Patient and/or Family's Strengths/Protective Factors: ?Social and Emotional competence, Concrete supports in place (healthy food, safe environments, etc.), and Sense of purpose ? ?Goals Addressed: ?Patient will: ? Reduce symptoms of: anxiety and depression  ? Increase knowledge and/or ability of: coping skills and stress reduction  ? Demonstrate ability to: Increase healthy adjustment to current life circumstances and Begin healthy grieving over loss ? ?Progress towards Goals: ?Ongoing ? ?Interventions: ?Interventions utilized:  Supportive Counseling and grief therapy ?Standardized Assessments completed: Not Needed ? ?Patient and/or Family Response: Pt receptive during visit today & wants monthly sessions. Pt is aware she can contact [facility] if she needs addt'l visits w/Clinician. ? ?Patient Centered Plan: ?Patient is on the following Treatment Plan(s): Pt wants to keep things moving in a positive manner. Pt wants the best for her Elizabeth Woodward. ?Assessment: ?Patient currently experiencing relief in her stress due to changes in her Elizabeth Woodward's beh. Pt has not rec'd any information from her Mcaid Application. Pt worries for her Life Ins Policy & does not want this mishandled. ? ? ?Patient may benefit from cont'd Cslg & ck-in sessions for mental health wellness. ? ?Plan: ?Follow up with behavioral health clinician on : one month for 60 min F2F visit ?Behavioral recommendations: Pt wants to move more & cont to work on relationship w/her Elizabeth Woodward Elizabeth Woodward ?Referral(s): [place] (In Clinic) & [place] for activities ?"From scale  of 1-10, how likely are you to follow plan?": 6 ? ?Donnetta Hutching, LMFT ? ? ?

## 2021-11-30 ENCOUNTER — Encounter: Payer: Medicare Other | Admitting: Internal Medicine

## 2021-12-04 ENCOUNTER — Encounter: Payer: Self-pay | Admitting: Internal Medicine

## 2021-12-04 ENCOUNTER — Ambulatory Visit (INDEPENDENT_AMBULATORY_CARE_PROVIDER_SITE_OTHER): Payer: Medicare Other | Admitting: Internal Medicine

## 2021-12-04 DIAGNOSIS — Z0000 Encounter for general adult medical examination without abnormal findings: Secondary | ICD-10-CM

## 2021-12-04 DIAGNOSIS — Z23 Encounter for immunization: Secondary | ICD-10-CM

## 2021-12-04 DIAGNOSIS — F419 Anxiety disorder, unspecified: Secondary | ICD-10-CM

## 2021-12-04 DIAGNOSIS — F32A Depression, unspecified: Secondary | ICD-10-CM

## 2021-12-04 DIAGNOSIS — I10 Essential (primary) hypertension: Secondary | ICD-10-CM | POA: Diagnosis not present

## 2021-12-04 DIAGNOSIS — E7841 Elevated Lipoprotein(a): Secondary | ICD-10-CM | POA: Diagnosis not present

## 2021-12-04 NOTE — Assessment & Plan Note (Signed)
Tdap given today 12/04/2021 ?

## 2021-12-04 NOTE — Assessment & Plan Note (Addendum)
PHQ 9 assessment score of 5.  She states she is feeling much better and looks forward to therapy sessions with Dr. Adrian Blackwater.  She has an upcoming appointment on 12/20/2021.  Things have been getting better at home and she has been getting along with her daughter.  She goes grocery shopping and prepares her own meals.  She enjoys watching her soaps on TV in her free time.  When the weather warms up she plans to get out and walk around for exercise.  She denies any changes in her appetite, eating and drinking appropriately.  She noted some weight gain of a few pounds, which is healthy for her BMI appropriate at 22.  Overall, her mood has improved and duloxetine 20 mg daily is working for her.  She plans to continue therapy with Dr. Carolynne Edouard.  Otherwise, denies any SI or HI or worsening in her mood. ? ?P: ?-Continue appointments with Dr. Carolynne Edouard ?-Continue duloxetine 20 mg daily ?- ? ?

## 2021-12-04 NOTE — Assessment & Plan Note (Addendum)
Home medications include amlodipine 10 mg and lisinopril 40 mg daily.  She is compliant with her medications.  On today's visit her blood pressure is at goal at 131/63.  She denies any complaints at this time. ? ?P: ?-Continue current regimen ?

## 2021-12-04 NOTE — Assessment & Plan Note (Signed)
She endorses side effects when she tried rosuvastatin.  Endorsing "bone pain"; she is currently taking ezetimibe 10 mg daily and endorses no side effects at this time.  She prefers to continue taking ezetimibe daily. ? ? ?P: ?-Continue ezetimibe daily ?

## 2021-12-04 NOTE — Progress Notes (Signed)
? ?CC: Blood pressure check, assess anxiety and depression ? ?HPI: ? ?[name] is a 81 y.o. female with a past medical history stated below and presents today for CC listed above. Please see problem based assessment and plan for additional details. ? ?Past Medical History:  ?Diagnosis Date  ? Angioedema   ? secondary to niacin  ? Anxiety   ? GERD (gastroesophageal reflux disease)   ? History of melanoma   ? status post excision of lesion 2006, dermatology  re-eva 2011  ? Hypercalcemia   ? possibloe association with HCTZ but patient wants to continue diuretic  ? Hyperlipidemia   ?  statin intolerance severe, myopathy  ? Hypertension   ? Hypomagnesemia   ? secondary to bisphosphonate IV  ? Intravenous bisphosphonates causing adverse effect in therapeutic use   ? Glu-like illness and electrolyte disturbance  ? Osteoporosis   ? Raynaud's phenomenon   ? negative ANA, ESR  ? ? ?Current Outpatient Medications on File Prior to Visit  ?Medication Sig Dispense Refill  ? amLODipine (NORVASC) 10 MG tablet Take 1 tablet (10 mg total) by mouth daily with breakfast. 90 tablet 3  ? Cholecalciferol (VITAMIN D-3 PO) Take 2 tablets by mouth daily.     ? DULoxetine (CYMBALTA) 20 MG capsule Take 1 capsule (20 mg total) by mouth daily. 90 capsule 3  ? ezetimibe (ZETIA) 10 MG tablet Take 1 tablet by mouth once daily 90 tablet 3  ? famotidine (PEPCID) 20 MG tablet Take 20 mg by mouth 2 (two) times daily. Take in AM and PM     ? lisinopril (ZESTRIL) 40 MG tablet TAKE 1 TABLET (40 MG TOTAL) BY MOUTH DAILY WITH SUPPER. 90 tablet 3  ? Multiple Vitamin (MULTIVITAMIN) tablet Take 1 tablet by mouth daily.      ? Omega 3-6-9 Fatty Acids (OMEGA-3 & OMEGA-6 FISH OIL PO) Take 1 tablet by mouth daily. Reported on 01/24/2016    ? propranolol ER (INDERAL LA) 80 MG 24 hr capsule Take 1 capsule by mouth once daily 90 capsule 3  ? Wheat Dextrin (BENEFIBER) TABS Take 1 tablet by mouth 2 (two) times daily. Reported on 01/24/2016    ? ?No current  facility-administered medications on file prior to visit.  ? ? ?Family History  ?Problem Relation Age of Onset  ? Heart disease Mother   ? Hypertension Mother   ? Heart disease Father   ? Cancer Brother 59  ?     prostate and lung  ? ? ?Social History  ? ?Socioeconomic History  ? Marital status: Single  ?  Spouse name: Not on file  ? Number of children: Not on file  ? Years of education: Not on file  ? Highest education level: Not on file  ?Occupational History  ? Occupation: retired  ?  Employer: RETIRED  ?Tobacco Use  ? Smoking status: Former  ?  Packs/day: 0.25  ?  Years: 5.00  ?  Pack years: 1.25  ?  Types: Cigarettes  ?  Quit date: 05/06/2010  ?  Years since quitting: 11.5  ? Smokeless tobacco: Never  ?Substance and Sexual Activity  ? Alcohol use: No  ?  Alcohol/week: 0.0 standard drinks  ? Drug use: No  ? Sexual activity: Never  ?Other Topics Concern  ? Not on file  ?Social History Narrative  ? Not on file  ? ?Social Determinants of Health  ? ?Financial Resource Strain: Not on file  ?Food Insecurity: No Food Insecurity  ?  Worried About Charity fundraiser in the Last Year: Never true  ? Ran Out of Food in the Last Year: Never true  ?Transportation Needs: No Transportation Needs  ? Lack of Transportation (Medical): No  ? Lack of Transportation (Non-Medical): No  ?Physical Activity: Not on file  ?Stress: Not on file  ?Social Connections: Not on file  ?Intimate Partner Violence: Not on file  ? ? ?Review of Systems: ?ROS negative except for what is noted on the assessment and plan. ? ?Vitals:  ? 12/04/21 1420  ?BP: 131/63  ?Pulse: 86  ?Temp: 97.6 ?F (36.4 ?C)  ?TempSrc: Oral  ?SpO2: 98%  ?Weight: 117 lb 8 oz (53.3 kg)  ?Height: 5' (1.524 m)  ? ? ? ?Physical Exam: ?Constitutional: well-appearing elderly woman sitting in the chair, in no acute distress ?HENT: normocephalic atraumatic, mucous membranes moist ?Eyes: conjunctiva non-erythematous ?Neck: supple ?Cardiovascular: regular rate and rhythm, no  m/r/g ?Pulmonary/Chest: normal work of breathing on room air, lungs clear to auscultation bilaterally ?Abdominal: soft, non-tender, non-distended ?MSK: normal bulk and tone ?Neurological: alert & oriented x 3, 5/5 strength in bilateral upper and lower extremities, normal gait ?Skin: warm and dry ?Psych: Normal mood ? ? ?Assessment & Plan:  ? ?See Encounters Tab for problem based charting. ? ?Patient discussed with Dr. Jimmye Norman ?Timothy Lasso, M.D. ?[facility] Internal Medicine, PGY-1 ?Pager: [phone], Phone: [phone] ?Date 12/04/2021 Time 2:42 PM  ?

## 2021-12-04 NOTE — Patient Instructions (Signed)
Thank you, [name] for allowing [place] to provide your care today. Today we discussed overall health.  You seem to be doing well! ?I have ordered the following labs for you: ? ?     None ? ?Tests ordered today: ? ?None ? ?Referrals ordered today:  ? ?Referral Orders  ?No referral(s) requested today  ?  ?  ? ?Follow up: 6 months  ? ?Remember: Continue taking your medications daily.  Continue your appointments with Dr. Carolynne Edouard.  You are doing great, keep up the good work! ? ?Should you have any questions or concerns please call the internal medicine clinic at [phone].   ? ?Timothy Lasso, MD ?[place] ?  ?

## 2021-12-06 ENCOUNTER — Institutional Professional Consult (permissible substitution): Payer: Medicare Other | Admitting: Behavioral Health

## 2021-12-08 NOTE — Progress Notes (Signed)
Internal Medicine Clinic Attending ? ?I saw and evaluated the patient.  I personally confirmed the key portions of the history and exam documented by Dr. Jeanice Lim and I reviewed pertinent patient test results.  The assessment, diagnosis, and plan were formulated together and I agree with the documentation in the resident?s note. I am pleased and relieved to see Ms. Littman doing so much better.   ?

## 2021-12-20 ENCOUNTER — Ambulatory Visit: Payer: Medicare Other | Admitting: Behavioral Health

## 2021-12-20 ENCOUNTER — Institutional Professional Consult (permissible substitution): Payer: Medicare Other | Admitting: Behavioral Health

## 2021-12-20 DIAGNOSIS — F32A Depression, unspecified: Secondary | ICD-10-CM

## 2021-12-20 DIAGNOSIS — F419 Anxiety disorder, unspecified: Secondary | ICD-10-CM

## 2021-12-20 NOTE — BH Specialist Note (Signed)
Integrated Behavioral Health Follow Up In-Person Visit ? ?MRN: [id number] ?Name: Elizabeth Woodward ? ?Number of [place] Clinician visits: 4 ?Session Start time: 0300 ?  ?Session End time: 9417 ? ?Total time in minutes: 50 ? ? ?Types of Service: Individual psychotherapy ? ?Interpretor:No. Interpretor Name and Language: self ? ? ?Subjective: ?Elizabeth Woodward is a 81 y.o. female accompanied by  self; Dtr drives her to visits ?Patient was referred by Dr. Lenise Herald, MD for mental health wellbeing, mourning her Son Elizabeth Woodward who died in 10-06-2019, & reduction of home stressors living w/Dtr. ?Patient reports the following symptoms/concerns: Pt is positive & hopeful about relationship improving w/Dtr in the home. ?Duration of problem: over one year; Severity of problem: moderate ? ?Objective: ?Mood: Anxious and Depressed and Affect: Appropriate ?Risk of harm to self or others: No plan to harm self or others ? ?Life Context: ?Family and Social: Pt is living in her Baby Str's home where she grew up w/her Dtr. Pt does not socialize. She goes out w/her Dtr who transports for errands/needs ?School/Work: Pt does not work nor attend school.  ?Self-Care: Pt is trying to keep her interactions w/her Dtr reasonable & contained. ?Life Changes: In 2021, Pt lost her Son who was 69yo. ? ?Patient and/or Family's Strengths/Protective Factors: ?Social and Emotional competence, Concrete supports in place (healthy food, safe environments, etc.), Caregiver has knowledge of parenting & child development, and Parental Resilience-Pt has been a Single Parent of her 2 children since their Father lft when they were 40yo & 41yo, respectively. Pt has worked 2-3 jobs since retiring.  ? ?Goals Addressed: ?Patient will: ? Reduce symptoms of: anxiety, depression, and stress  ? Increase knowledge and/or ability of: coping skills, healthy habits, and stress reduction. Pt hopes to grieve for her Son when things settle down in the home.  ?  Demonstrate ability to: Increase healthy adjustment to current life circumstances and Begin healthy grieving over loss ? ?Progress towards Goals: ?Ongoing ? ?Interventions: ?Interventions utilized:  Solution-Focused Strategies and Supportive Counseling ?Standardized Assessments completed:  screeners prn ? ?Patient and/or Family Response: Pt is receptive to appts & requests we move them out to once/month. ? ?Patient Centered Plan: ?Patient is on the following Treatment Plan(s): Pt will maintain the peace in the home, as much as she is able. Pt also sits on the porch for outdoor time, fresh air, & to watch the world. ?Assessment: ?Patient currently experiencing improvement in her anx/dep. Dtr is acting more civil. Pt feels Dtr is ashamed of her beh & misses her own Dtr who moved away 6 yrs ago w/her children & Husb. Dtr has not seen her own child since that time.  ? ?Patient may benefit from cont'd Cslg ck-ins. ? ?Plan: ?Follow up with behavioral health clinician on : one month for 60 min F2F ?Behavioral recommendations: Pt will keep her belongings safe & keep discussions w/Dtr manageable. Pt promises to alert Clinician if situation becomes unsafe or she needs inc'd resources. ?Referral(s): [place] (In Clinic) ?"From scale of 1-10, how likely are you to follow plan?": 9 ? ?Donnetta Hutching, LMFT ? ? ?

## 2022-01-16 ENCOUNTER — Institutional Professional Consult (permissible substitution): Payer: Medicare Other | Admitting: Behavioral Health

## 2022-01-17 ENCOUNTER — Institutional Professional Consult (permissible substitution): Payer: Medicare Other | Admitting: Behavioral Health

## 2022-01-23 ENCOUNTER — Institutional Professional Consult (permissible substitution): Payer: Medicare Other | Admitting: Behavioral Health

## 2022-01-23 ENCOUNTER — Ambulatory Visit: Payer: Medicare Other | Admitting: Behavioral Health

## 2022-01-23 DIAGNOSIS — F331 Major depressive disorder, recurrent, moderate: Secondary | ICD-10-CM

## 2022-01-23 DIAGNOSIS — F419 Anxiety disorder, unspecified: Secondary | ICD-10-CM

## 2022-01-23 NOTE — BH Specialist Note (Addendum)
Integrated Behavioral Health Follow Up In-Person Visit  MRN: [id number] Name: Elizabeth Woodward  Number of [facility] Clinician visits: 4- Fourth Visit  Session Start time: 0300   Session End time: 5170  Total time in minutes: 50   Types of Service: Individual psychotherapy  Interpretor:No. Interpretor Name and Language: n/a   Subjective: Elizabeth Woodward is a 81 y.o. female accompanied by  self Patient was referred by Dr. Lenise Herald, MD for mental health wellbeing & Family issues in the home invlg her Dtr. Patient reports the following symptoms/concerns: Dtr has been unkind in the past & called Pt names. Pt is uncertain if Dtr goes through or takes her mail. Pt has applied for [employer] so she can move out of the home.  Pt reports her Dtr has let her return to her bedroom as of January 04, 2022. She purchased a twin bed w/matress & has a Software engineer. They are watching some together & also sit on the porch together. Dtr is not calling her names, accusing her of anything, nor is she denying transportation. Duration of problem: over one year since the death of her Son; Severity of problem: moderate  Objective: Mood: Anxious and Depressed and Affect: Appropriate Risk of harm to self or others: No plan to harm self or others  Life Context: Family and Social: Pt lives w/her only Dtr Suzie; the relationship they share has been strained since Pt moved into home to live w/Dtr. School/Work: Pt is not in Sch & she does not work.  Self-Care: Pt puts herself last & does not tend to self-care practices often. Life Changes: Pt lost her [name] in early 2022. She has been unable to mourn his loss since that time due to moving in w/her Dtr who has been treating her negatively, removed her from her estb'd bedroom in the recent past & lft her to live on the sofa.  Patient and/or Family's Strengths/Protective Factors: Concrete supports in place (healthy food, safe environments, etc.),  Parental Resilience, and Pt has healthy sense of independence & wants a good relationship w/her Dtr.  Goals Addressed: Patient will:  Reduce symptoms of: anxiety, depression, and stress   Increase knowledge and/or ability of: coping skills, healthy habits, and stress reduction. Skills to advocate for self in the face of Dtr's neg beh towards Pt.  Demonstrate ability to: Begin healthy grieving over loss and navigate relationship w/her Dtr safely & with an advocate on board per Clinician role.  Progress towards Goals: Pt wants another session in the future.  Interventions: Interventions utilized:  Motivational Interviewing, Solution-Focused Strategies, and Supportive Counseling Standardized Assessments completed:  screeners prn  Patient and/or Family Response: Pt is receptive to visit today  Patient Centered Plan: Patient is on the following Treatment Plan(s): Cope with more skills @ home & notify Dtr things need to change. Assessment: Patient currently experiencing some dec in anx/dep due to Dtr changing her beh somewhat. Pt is more hopeful today than in the recent past.  Patient may benefit from cont'd Cslg to monitor situation & to assist Pt to cope & have support from objective outside resource.  Plan: Follow up with behavioral health clinician on : 01/16/22. Behavioral recommendations: Do more for yourself-it will raise your spirits & contribute to your well-being. Referral(s):  None today "From scale of 1-10, how likely are you to follow plan?": [place], LMFT

## 2022-01-31 ENCOUNTER — Telehealth: Payer: Self-pay | Admitting: *Deleted

## 2022-01-31 NOTE — Chronic Care Management (AMB) (Unsigned)
  Care Coordination Note  01/31/2022 Name: Elizabeth Woodward MRN: [id number] DOB: 03-29-1941  Elizabeth Woodward is a 81 y.o. year old female who is a primary care patient of Angelica Pou, MD and is actively engaged with the care management team. I reached out to Colleen Can by phone today to assist with re-scheduling a follow up visit with the BSW  Follow up plan: Unsuccessful telephone outreach attempt made. A HIPAA compliant phone message was left for the patient providing contact information and requesting a return call.  The care management team will reach out to the patient again over the next 1-2 days.  If patient returns call to provider office, please advise to call [place] at [phone].  [place] Management  Direct Dial: [phone]

## 2022-02-01 NOTE — Chronic Care Management (AMB) (Signed)
  Care Coordination Note  02/01/2022 Name: Elizabeth Woodward MRN: [id number] DOB: 09-06-1940  Elizabeth Woodward is a 81 y.o. year old female who is a primary care patient of Angelica Pou, MD and is actively engaged with the care management team. I reached out to Colleen Can by phone today to assist with scheduling a follow up visit with the BSW  Follow up plan: Telephone appointment with care management team member scheduled for:02/08/22  [place] Management  Direct Dial: [phone]

## 2022-02-08 ENCOUNTER — Ambulatory Visit: Payer: Medicare Other | Admitting: Licensed Clinical Social Worker

## 2022-02-08 NOTE — Chronic Care Management (AMB) (Signed)
  Care Management   Social Work Visit Note  02/08/2022 Name: Elizabeth Woodward MRN: [id number] DOB: [birth year]  Elizabeth Woodward is a 81 y.o. year old female who sees Jimmye Norman, Elaina Pattee, MD for primary care. The care management team was consulted for assistance with care management and care coordination needs related to [facility]    Patient was given the following information about care management and care coordination services today, agreed to services, and gave verbal consent: 1.care management/care coordination services include personalized support from designated clinical staff supervised by their physician, including individualized plan of care and coordination with other care providers 2. 24/7 contact phone numbers for assistance for urgent and routine care needs. 3. The patient may stop care management/care coordination services at any time by phone call to the office staff.  Engaged with patient by telephone for initial visit in response to provider referral for social work chronic care management and care coordination services.  Assessment: Review of patient history, allergies, and health status during evaluation of patient need for care management/care coordination services.    Interventions:  Patient interviewed and appropriate assessments performed Collaborated with clinical team regarding patient needs  Successful outreach to patient. Call was brief. SW called a few minutes earlier to remind patient our visit was on the telephone and not in person. Patient kept repeating she was on the way to the [facility]. SW advised patient not to. Patient ended call.  SW contacted patient again. Patient asked SW to contact her back because she was on the way to [facility]. SW attempted several times to clarify appointment. Patient ended call.   Plan:  SW will attempt patient again on today.  Lenor Derrick , MSW Social Worker [facility] Care Management  [phone]

## 2022-03-08 ENCOUNTER — Other Ambulatory Visit: Payer: Self-pay | Admitting: Internal Medicine

## 2022-03-08 DIAGNOSIS — I10 Essential (primary) hypertension: Secondary | ICD-10-CM

## 2022-03-08 DIAGNOSIS — R000 Tachycardia, unspecified: Secondary | ICD-10-CM

## 2022-03-08 DIAGNOSIS — G250 Essential tremor: Secondary | ICD-10-CM

## 2022-03-14 ENCOUNTER — Other Ambulatory Visit: Payer: Self-pay | Admitting: *Deleted

## 2022-03-15 MED ORDER — DULOXETINE HCL 20 MG PO CPEP: 20.0000 mg | ORAL_CAPSULE | Freq: Every day | ORAL | 3 refills | Status: DC

## 2022-05-07 DIAGNOSIS — H52223 Regular astigmatism, bilateral: Secondary | ICD-10-CM | POA: Diagnosis not present

## 2022-05-07 DIAGNOSIS — H53143 Visual discomfort, bilateral: Secondary | ICD-10-CM | POA: Diagnosis not present

## 2022-05-07 DIAGNOSIS — H5203 Hypermetropia, bilateral: Secondary | ICD-10-CM | POA: Diagnosis not present

## 2022-05-07 DIAGNOSIS — H26053 Posterior subcapsular polar infantile and juvenile cataract, bilateral: Secondary | ICD-10-CM | POA: Diagnosis not present

## 2022-06-19 ENCOUNTER — Other Ambulatory Visit: Payer: Self-pay

## 2022-06-19 DIAGNOSIS — G250 Essential tremor: Secondary | ICD-10-CM

## 2022-06-19 DIAGNOSIS — I10 Essential (primary) hypertension: Secondary | ICD-10-CM

## 2022-06-19 DIAGNOSIS — R000 Tachycardia, unspecified: Secondary | ICD-10-CM

## 2022-06-20 MED ORDER — PROPRANOLOL HCL ER 80 MG PO CP24: 80.0000 mg | ORAL_CAPSULE | Freq: Every day | ORAL | 0 refills | Status: DC

## 2022-06-20 NOTE — Telephone Encounter (Signed)
Received incoming fax from pharmacy please refill rx.

## 2022-06-22 ENCOUNTER — Encounter: Payer: Self-pay | Admitting: Internal Medicine

## 2022-06-22 ENCOUNTER — Ambulatory Visit (INDEPENDENT_AMBULATORY_CARE_PROVIDER_SITE_OTHER): Payer: Medicare Other | Admitting: Internal Medicine

## 2022-06-22 VITALS — BP 139/74 | HR 79 | Wt 120.0 lb

## 2022-06-22 DIAGNOSIS — Z87891 Personal history of nicotine dependence: Secondary | ICD-10-CM | POA: Diagnosis not present

## 2022-06-22 DIAGNOSIS — I10 Essential (primary) hypertension: Secondary | ICD-10-CM | POA: Diagnosis not present

## 2022-06-22 DIAGNOSIS — G250 Essential tremor: Secondary | ICD-10-CM

## 2022-06-22 DIAGNOSIS — E7841 Elevated Lipoprotein(a): Secondary | ICD-10-CM | POA: Diagnosis not present

## 2022-06-22 DIAGNOSIS — R770 Abnormality of albumin: Secondary | ICD-10-CM

## 2022-06-22 DIAGNOSIS — F32A Depression, unspecified: Secondary | ICD-10-CM

## 2022-06-22 DIAGNOSIS — M25561 Pain in right knee: Secondary | ICD-10-CM

## 2022-06-22 DIAGNOSIS — Z23 Encounter for immunization: Secondary | ICD-10-CM

## 2022-06-22 DIAGNOSIS — H6121 Impacted cerumen, right ear: Secondary | ICD-10-CM

## 2022-06-22 DIAGNOSIS — F419 Anxiety disorder, unspecified: Secondary | ICD-10-CM

## 2022-06-22 DIAGNOSIS — G8929 Other chronic pain: Secondary | ICD-10-CM | POA: Insufficient documentation

## 2022-06-22 DIAGNOSIS — R000 Tachycardia, unspecified: Secondary | ICD-10-CM | POA: Diagnosis not present

## 2022-06-22 DIAGNOSIS — M810 Age-related osteoporosis without current pathological fracture: Secondary | ICD-10-CM

## 2022-06-22 MED ORDER — PROPRANOLOL HCL ER 80 MG PO CP24: 80.0000 mg | ORAL_CAPSULE | Freq: Every day | ORAL | 3 refills | Status: DC

## 2022-06-22 NOTE — Progress Notes (Signed)
81 yo Elizabeth Woodward is my patient who I"ve lost touch with since her last visit with me 08/2021; she did see one of our residents in 12/2021 and I briefly spoke to her at that time, when she was doing fairly well.  She was experiencing many social stressors and we had lost her to f/u.  Today I am delighted to see her looking like a new person! She endorses feeling great.  Has gained weight now that she is eating consistently, is sleeping well, no pain, no anxiety, no anything but content! Getting excited about putting up a tree and decorating for the holidays outside.  Enjoying new relationship with her daughter with whom she has been living over the past year or so; no more stress.  Still feels pangs of sadness and grief about her deceased son, though she feels she is managing well.  She is happy, remarkably.  Walks at grocery store with cart for stability; no falls.  Does her own shopping, cooking ("we like to eat different food") and housekeeping.  No problems with thinking or memory.  Poor hearing in R ear, questions earwax.  Is not interested in hearing aides.  .    BP 139/74 (BP Location: Left Arm, Patient Position: Sitting, Cuff Size: Normal)   Pulse 79   Wt 120 lb (54.4 kg)   SpO2 100%   BMI 23.44 kg/m  Radial pulses full, heart RRR with soft syst murmur at base, lungs clear, no JVD Smiles, easy to laugh, good energy in visit.  R canal occluded with cerumen, l canal open with nml TM.  Mildly puffy ankles, R>L (her left knee is chronically larger with thickened though nontender jointline).  Superficial varicosities about ankles.  Feet in good condition, warm, no lesions, nails trimmed, no edema, no deformities or calluses, dp pulses 1+.  No tremor or tremulousness. Thickened flexed dips, nontender. Increased waist to hip ratio with abdominal obesity.  Walks with flexed gait favoring her R knee. No assistive device needed; wide based gait, cautious, good balance.    Assessment and plan:  A  remarkable improvement in both her own sense of well being and in her general appearance and affect.  Functionally she is doing okay, remaining active and independent in her ADLs and enjoying her daughter's company.  As long as she can avoid a fall with fracture (her most immediate threat), she will be doing well.    Anxiety and depression Doing exceptionally well now that her relationship with her dtr has mended - they are enjoying each other's company, looking forward to decorating for the holidays, feeling NO further anxiety or depression.  Taking duloxetine 20 mg daily, though it may not be the intervention responsible for her improvement. Continue for now.  Sinus tachycardia Recently ran out of her beta blocker and she recorded subsequent increased HR to 120s until refilled, when HR then returned to normal.  Continue management which is effective.  NO palpitations, chest pressure, or difficulty breathing.  Heart RRR today.  Hyperlipidemia Due for lipid panel; taking only ezetimibe by preference given negative experience with statins in the past.  Now that she is feeling more emotionally stable, she may be amenable to restarting a statin.    Osteoporosis Vitamin D level today.  She has not wished to start bisphosphonate.  She is a high fall risk given her gait and chronic R knee pain.  I'll continue to discuss options with her.  Hearing loss due to cerumen impaction, right Impacted  cerumen in R ear successfully removed with loop today, with immediate improvement in hearing.  Will resolve problem.  Essential tremor No tremor or tremulousness either on exam today or at home.  Continue propranolol.  Essential hypertension 139/74 today (lower readings at home) on amlodipine 10 mg and lisinopril 40 mg daily which she took this morning.  Will recheck renal fxn today before refilling lisinopril.  She is experiencing mild ankle puffiness which might be attributable to the high dose amlodipine; we  can discuss dose reduction at future visit.   Ensure bmp ok before refilling lisinopril, to be done at [facility]

## 2022-06-22 NOTE — Assessment & Plan Note (Signed)
Vitamin D level today.  She has not wished to start bisphosphonate.  She is a high fall risk given her gait and chronic R knee pain.  I'll continue to discuss options with her.

## 2022-06-22 NOTE — Assessment & Plan Note (Signed)
Recently ran out of her beta blocker and she recorded subsequent increased HR to 120s until refilled, when HR then returned to normal.  Continue management which is effective.  NO palpitations, chest pressure, or difficulty breathing.  Heart RRR today.

## 2022-06-22 NOTE — Assessment & Plan Note (Signed)
Chronic joint thickening without joint line tenderness or effusion.  Favors knee when walking.  RLE has increased mild swelling compared to L.  Uses shopping cart for stability at store, otherwise not needing a walking assistive device.  I'll discuss with her next time if she'd be open to PT or the PREP exercise program.  Her daughter would have to provide transportation.

## 2022-06-22 NOTE — Assessment & Plan Note (Signed)
Impacted cerumen in R ear successfully removed with loop today, with immediate improvement in hearing.  Will resolve problem.

## 2022-06-22 NOTE — Assessment & Plan Note (Signed)
139/74 today (lower readings at home) on amlodipine 10 mg and lisinopril 40 mg daily which she took this morning.  Will recheck renal fxn today before refilling lisinopril.  She is experiencing mild ankle puffiness which might be attributable to the high dose amlodipine; we can discuss dose reduction at future visit.

## 2022-06-22 NOTE — Assessment & Plan Note (Signed)
No tremor or tremulousness either on exam today or at home.  Continue propranolol.

## 2022-06-22 NOTE — Patient Instructions (Signed)
Elizabeth Woodward,  What a delight to see you today, and doing so well!! You've made a complete turn around and I couldn't be happier!  Thank you so much for coming in today for a check up.  We cleaned out your ear, we are checking some blood work (I'll call next week with results) and you got a flu shot.  All good!    Let's plan to get together in 6 months if everything looks good on your tests.  I"ll plan to refill your lisinopril at [facility] once I confirm that we will stay on the same dose.  Enjoy your dinner next week at [employer]!  Take care and stay well,  Dr. Jimmye Norman

## 2022-06-22 NOTE — Assessment & Plan Note (Signed)
Due for lipid panel; taking only ezetimibe by preference given negative experience with statins in the past.  Now that she is feeling more emotionally stable, she may be amenable to restarting a statin.

## 2022-06-22 NOTE — Assessment & Plan Note (Signed)
Doing exceptionally well now that her relationship with her dtr has mended - they are enjoying each other's company, looking forward to decorating for the holidays, feeling NO further anxiety or depression.

## 2022-06-24 LAB — LIPID PANEL
Chol/HDL Ratio: 6 ratio — ABNORMAL HIGH (ref 0.0–4.4)
Cholesterol, Total: 294 mg/dL — ABNORMAL HIGH (ref 100–199)
HDL: 49 mg/dL (ref >39)
LDL Chol Calc (NIH): 197 mg/dL — ABNORMAL HIGH (ref 0–99)
Triglycerides: 246 mg/dL — ABNORMAL HIGH (ref 0–149)
VLDL Cholesterol Cal: 48 mg/dL — ABNORMAL HIGH (ref 5–40)

## 2022-06-24 LAB — CMP14 + ANION GAP
ALT: 8 IU/L (ref 0–32)
AST: 15 IU/L (ref 0–40)
Albumin/Globulin Ratio: 1.6 (ref 1.2–2.2)
Albumin: 4.7 g/dL (ref 3.8–4.8)
Alkaline Phosphatase: 125 IU/L — ABNORMAL HIGH (ref 44–121)
Anion Gap: 16 mmol/L (ref 10.0–18.0)
BUN/Creatinine Ratio: 17 (ref 12–28)
BUN: 22 mg/dL (ref 8–27)
Bilirubin Total: <0.2 mg/dL (ref 0.0–1.2)
CO2: 23 mmol/L (ref 20–29)
Calcium: 10.5 mg/dL — ABNORMAL HIGH (ref 8.7–10.3)
Chloride: 95 mmol/L — ABNORMAL LOW (ref 96–106)
Creatinine, Ser: 1.29 mg/dL — ABNORMAL HIGH (ref 0.57–1.00)
Globulin, Total: 2.9 g/dL (ref 1.5–4.5)
Glucose: 107 mg/dL — ABNORMAL HIGH (ref 70–99)
Potassium: 4.7 mmol/L (ref 3.5–5.2)
Sodium: 134 mmol/L (ref 134–144)
Total Protein: 7.6 g/dL (ref 6.0–8.5)
eGFR: 42 mL/min/{1.73_m2} — ABNORMAL LOW (ref >59)

## 2022-06-24 LAB — VITAMIN D 25 HYDROXY (VIT D DEFICIENCY, FRACTURES): Vit D, 25-Hydroxy: 24.2 ng/mL — ABNORMAL LOW (ref 30.0–100.0)

## 2022-07-04 ENCOUNTER — Telehealth: Payer: Self-pay

## 2022-07-04 ENCOUNTER — Other Ambulatory Visit: Payer: Self-pay | Admitting: Internal Medicine

## 2022-07-04 DIAGNOSIS — I10 Essential (primary) hypertension: Secondary | ICD-10-CM

## 2022-07-04 DIAGNOSIS — E7841 Elevated Lipoprotein(a): Secondary | ICD-10-CM

## 2022-07-04 MED ORDER — LISINOPRIL 40 MG PO TABS: 40.0000 mg | ORAL_TABLET | Freq: Every day | ORAL | 3 refills | Status: DC

## 2022-07-04 MED ORDER — ATORVASTATIN CALCIUM 10 MG PO TABS: 10.0000 mg | ORAL_TABLET | Freq: Every day | ORAL | 5 refills | Status: DC

## 2022-07-04 NOTE — Progress Notes (Signed)
Called Elizabeth Woodward with results.  Severe hyperlipidemia.  On ezetimibe, hx of myalgias with statins tried in past, though hasn't tried atorvastatin which she agrees to do.  Encouraged to increase her fluid intake with respect to renal fxn.  Fu in [place].

## 2022-07-04 NOTE — Telephone Encounter (Signed)
Pt is requesting a call back .Marland Kitchen She is wanting her lab results  from her labs

## 2022-09-11 ENCOUNTER — Other Ambulatory Visit: Payer: Self-pay

## 2022-09-11 DIAGNOSIS — G250 Essential tremor: Secondary | ICD-10-CM

## 2022-09-11 DIAGNOSIS — I10 Essential (primary) hypertension: Secondary | ICD-10-CM

## 2022-09-11 DIAGNOSIS — R000 Tachycardia, unspecified: Secondary | ICD-10-CM

## 2022-09-11 MED ORDER — AMLODIPINE BESYLATE 10 MG PO TABS: 10.0000 mg | ORAL_TABLET | Freq: Every day | ORAL | 3 refills | Status: DC

## 2022-09-11 MED ORDER — PROPRANOLOL HCL ER 80 MG PO CP24: 80.0000 mg | ORAL_CAPSULE | Freq: Every day | ORAL | 3 refills | Status: DC

## 2022-11-13 ENCOUNTER — Other Ambulatory Visit: Payer: Self-pay

## 2022-11-13 MED ORDER — EZETIMIBE 10 MG PO TABS: 10.0000 mg | ORAL_TABLET | Freq: Every day | ORAL | 3 refills | Status: DC

## 2023-01-09 ENCOUNTER — Other Ambulatory Visit: Payer: Self-pay

## 2023-01-09 DIAGNOSIS — E7841 Elevated Lipoprotein(a): Secondary | ICD-10-CM

## 2023-01-10 MED ORDER — ATORVASTATIN CALCIUM 10 MG PO TABS
10.0000 mg | ORAL_TABLET | Freq: Every day | ORAL | 11 refills | Status: DC
Start: 2023-01-10 — End: 2024-01-16

## 2023-03-26 ENCOUNTER — Other Ambulatory Visit: Payer: Self-pay

## 2023-03-26 MED ORDER — DULOXETINE HCL 20 MG PO CPEP: 20.0000 mg | ORAL_CAPSULE | Freq: Every day | ORAL | 3 refills | Status: DC

## 2023-03-28 ENCOUNTER — Encounter: Payer: Medicare Other | Admitting: Internal Medicine

## 2023-04-09 ENCOUNTER — Ambulatory Visit (INDEPENDENT_AMBULATORY_CARE_PROVIDER_SITE_OTHER): Payer: Medicare Other | Admitting: Internal Medicine

## 2023-04-09 VITALS — BP 129/49 | HR 88 | Temp 97.5°F | Ht 60.0 in | Wt 122.2 lb

## 2023-04-09 DIAGNOSIS — E785 Hyperlipidemia, unspecified: Secondary | ICD-10-CM

## 2023-04-09 DIAGNOSIS — E7841 Elevated Lipoprotein(a): Secondary | ICD-10-CM

## 2023-04-09 DIAGNOSIS — Z87891 Personal history of nicotine dependence: Secondary | ICD-10-CM | POA: Diagnosis not present

## 2023-04-09 DIAGNOSIS — L578 Other skin changes due to chronic exposure to nonionizing radiation: Secondary | ICD-10-CM

## 2023-04-09 DIAGNOSIS — I10 Essential (primary) hypertension: Secondary | ICD-10-CM

## 2023-04-09 DIAGNOSIS — R5383 Other fatigue: Secondary | ICD-10-CM | POA: Diagnosis not present

## 2023-04-09 DIAGNOSIS — R000 Tachycardia, unspecified: Secondary | ICD-10-CM

## 2023-04-09 DIAGNOSIS — R5382 Chronic fatigue, unspecified: Secondary | ICD-10-CM

## 2023-04-09 DIAGNOSIS — M810 Age-related osteoporosis without current pathological fracture: Secondary | ICD-10-CM | POA: Diagnosis not present

## 2023-04-09 DIAGNOSIS — F419 Anxiety disorder, unspecified: Secondary | ICD-10-CM | POA: Diagnosis not present

## 2023-04-09 DIAGNOSIS — F32A Depression, unspecified: Secondary | ICD-10-CM | POA: Diagnosis not present

## 2023-04-09 NOTE — Progress Notes (Signed)
82 year old Elizabeth Woodward is here for delayed follow-up, her most recent visit with me being fall 2023 with plan for 55-month recheck.  In the interim she reports that she has been doing okay.  She continues to live with her daughter, does her own shopping and cooking (daughter dropped her off at the store; they enjoy eating different foods), and in general is content with her days, often spent sitting on the porch.  She remarks that she feels like she is moving around slower and that she has had very poor energy, not able to comfortably do her usual activities as a consequence.  No shortness of breath, no new pain (chronic right knee pain due to remote injury), eating well and maintaining weight (has actually gradually gained weight over the past couple of years, after period of losing weight due to depression, grief, and family conflict all of which has improved).  She brings her medicines today-specifically wants to eliminate ezetimibe of which she has 2 full bottles: She read the package insert which suggested that fatigue and aches and pains could be a potential side effect.  She has taken all medications today.  Patient Active Problem List   Diagnosis Date Noted   Fatigue 04/10/2023   Sun-damaged skin with history of skin cancer face 04/10/2023   Fatty liver, mild, noted on imaging 2008 04/10/2023   Chronic pain of right knee 06/22/2022   Hearing loss due to cerumen impaction, right 02/23/2021   Sinus tachycardia 03/14/2020   Essential tremor 03/14/2020   Vaginal wall prolapse 06/14/2018   Preventative health care 11/01/2011   Hyperlipidemia 06/13/2006   Anxiety and depression 06/13/2006   Essential hypertension 06/13/2006   GERD 06/13/2006   Osteoporosis 06/13/2006   Current Outpatient Medications:    amLODipine (NORVASC) 10 MG tablet, Take 1 tablet (10 mg total) by mouth daily with breakfast., Disp: 90 tablet, Rfl: 3   atorvastatin (LIPITOR) 10 MG tablet, Take 1 tablet (10 mg total) by  mouth daily., Disp: 30 tablet, Rfl: 11   Cholecalciferol (VITAMIN D-3 PO), Take 2 tablets by mouth daily. , Disp: , Rfl:    DULoxetine (CYMBALTA) 20 MG capsule, Take 1 capsule (20 mg total) by mouth daily., Disp: 90 capsule, Rfl: 3   famotidine (PEPCID) 20 MG tablet, Take 20 mg by mouth 2 (two) times daily. Take in AM and PM , Disp: , Rfl:    lisinopril (ZESTRIL) 40 MG tablet, Take 1 tablet (40 mg total) by mouth daily with supper., Disp: 90 tablet, Rfl: 3   Multiple Vitamin (MULTIVITAMIN) tablet, Take 1 tablet by mouth daily.  , Disp: , Rfl:    Omega 3-6-9 Fatty Acids (OMEGA-3 & OMEGA-6 FISH OIL PO), Take 1 tablet by mouth daily. Reported on 01/24/2016, Disp: , Rfl:    propranolol ER (INDERAL LA) 80 MG 24 hr capsule, Take 1 capsule (80 mg total) by mouth daily., Disp: 90 capsule, Rfl: 3   Wheat Dextrin (BENEFIBER) TABS, Take 1 tablet by mouth 2 (two) times daily. Reported on 01/24/2016, Disp: , Rfl:   Objective: BP (!) 129/49 (BP Location: Right Arm, Patient Position: Sitting, Cuff Size: Small)   Pulse 88   Temp (!) 97.5 F (36.4 C) (Oral)   Ht 5' (1.524 m)   Wt 122 lb 3.2 oz (55.4 kg)   SpO2 99%   BMI 23.87 kg/m  Petite habitus, abdominal obesity present despite low BMI.  Cheerful affect, talkative, nervous and somewhat fidgety.  Hearing is reduced.  Solar skin changes  of erythema and scaling are significant over the face.  She is tanned in areas of sun exposure; skin is thin with reduced turgor. Palms are very clammy and silky with minimal texture, very pink in contrast to skin color elsewhere.  No tenderness of hand joints, fingers are not swollen, no significant nail changes.  Heart RRR; syst murmur at base, unchanged; neck veins flat lungs clear. Feet edematous, legs with superficial varicosities. Pedal skin in good condition, dp pulses 1+.  R knee is thicker than L with no active effusion.  Gait is mildly antalgic favoring R knee (unchanged).   Assessment and plan:  Elizabeth Woodward is doing  well (though fatigue will be addressed) and continues to voice desire to minimize medications and to avoid doctor visits.  As itemized below, she feels very comfortable with her current medication regimen with the exception of ezetimibe and does not wish to entertain changes.  Specific problems as follows:  Essential hypertension Well-controlled 129/49 on amlodipine 10 mg and lisinopril 40 mg daily.  BMP today as she appears to be chronically volume depleted.  Pedal edema is unchanged and is suspected to be due to the higher dose amlodipine.  No decision on management until labs resulted.  Sun-damaged skin with history of skin cancer face History of skin cancer surgery on forehead.  Sun damaged skin over arms and face discussed with her as a concern for skin cancer risk.  She acknowledges this and does not wish to see a dermatologist.  When asked if she would except the risk of developing skin cancer she said "yes so I do not have to go to another doctor".  Dermatology referral declined at this time.  I will continue to address, though acknowledge that minimizing medical care is important to her.  Osteoporosis No falls, no fractures.  Taking vitamin D supplement.  Continues to decline bisphosphonate though understands that osteoporosis is a risk factor for fracture.  Sinus tachycardia Managed with propranolol 80 mg long-acting each day.  No palpitations.  NSR today.  No positional dizziness.  Continue to monitor.  Hyperlipidemia Treated with statin and ezetimibe for primary prevention.  Had been treated exclusively with ezetimibe for some time, due to history of intolerance to statin, though she did agree to try atorvastatin which she is tolerating at 10 mg daily.  She brings package insert for ezetimibe today and does not wish to take it any longer, noted that potential side effects could be fatigue and aching, which she experiences chronically.  Doubtful the ezetimibe is responsible particularly for  the fatigue and given her tolerance in the past.  Repeat lipid panel today and increase atorvastatin if needed.  Anxiety and depression Elizabeth Woodward endorses a stable mood.  She feels that her propranolol and her duloxetine help manage her anxiety.  She does not wish to adjust dose of either medicine.  Fatigue Her most significant problem reported today: Will screen for anemia, B12 deficiency, thyroid disease, and renal and liver function.  If her blood pressures are running lower at home, there may be a component of overly controlled blood pressure.  She does need to drink more fluids.

## 2023-04-09 NOTE — Patient Instructions (Signed)
Ms. [place]ola, [place] to see you today!  We reviewed your medicines and I'm stopping your ezetimibe as requested.  I will check some blood tests today to see if there is a medical problem causing you to feel so tired all of the time.   I'll call you with the results.  I'm glad things are going ok!  Take care and stay well.  Dr. Mayford Knife

## 2023-04-10 ENCOUNTER — Encounter: Payer: Self-pay | Admitting: Internal Medicine

## 2023-04-10 DIAGNOSIS — K760 Fatty (change of) liver, not elsewhere classified: Secondary | ICD-10-CM | POA: Insufficient documentation

## 2023-04-10 DIAGNOSIS — R5383 Other fatigue: Secondary | ICD-10-CM | POA: Insufficient documentation

## 2023-04-10 DIAGNOSIS — L578 Other skin changes due to chronic exposure to nonionizing radiation: Secondary | ICD-10-CM | POA: Insufficient documentation

## 2023-04-10 LAB — LIPID PANEL
Chol/HDL Ratio: 4.8 ratio — ABNORMAL HIGH (ref 0.0–4.4)
Cholesterol, Total: 218 mg/dL — ABNORMAL HIGH (ref 100–199)
HDL: 45 mg/dL (ref >39)
LDL Chol Calc (NIH): 129 mg/dL — ABNORMAL HIGH (ref 0–99)
Triglycerides: 246 mg/dL — ABNORMAL HIGH (ref 0–149)
VLDL Cholesterol Cal: 44 mg/dL — ABNORMAL HIGH (ref 5–40)

## 2023-04-10 LAB — TSH: TSH: 1.88 u[IU]/mL (ref 0.450–4.500)

## 2023-04-10 LAB — CMP14 + ANION GAP
ALT: 8 IU/L (ref 0–32)
AST: 15 IU/L (ref 0–40)
Albumin: 4.8 g/dL — ABNORMAL HIGH (ref 3.7–4.7)
Alkaline Phosphatase: 153 IU/L — ABNORMAL HIGH (ref 44–121)
Anion Gap: 18 mmol/L (ref 10.0–18.0)
BUN/Creatinine Ratio: 23 (ref 12–28)
BUN: 28 mg/dL — ABNORMAL HIGH (ref 8–27)
Bilirubin Total: 0.3 mg/dL (ref 0.0–1.2)
CO2: 19 mmol/L — ABNORMAL LOW (ref 20–29)
Calcium: 10.5 mg/dL — ABNORMAL HIGH (ref 8.7–10.3)
Chloride: 97 mmol/L (ref 96–106)
Creatinine, Ser: 1.23 mg/dL — ABNORMAL HIGH (ref 0.57–1.00)
Globulin, Total: 3.2 g/dL (ref 1.5–4.5)
Glucose: 116 mg/dL — ABNORMAL HIGH (ref 70–99)
Potassium: 5.1 mmol/L (ref 3.5–5.2)
Sodium: 134 mmol/L (ref 134–144)
Total Protein: 8 g/dL (ref 6.0–8.5)
eGFR: 44 mL/min/{1.73_m2} — ABNORMAL LOW (ref >59)

## 2023-04-10 LAB — CBC
Hematocrit: 35.7 % (ref 34.0–46.6)
Hemoglobin: 11.8 g/dL (ref 11.1–15.9)
MCH: 27.3 pg (ref 26.6–33.0)
MCHC: 33.1 g/dL (ref 31.5–35.7)
MCV: 82 fL (ref 79–97)
Platelets: 489 10*3/uL — ABNORMAL HIGH (ref 150–450)
RBC: 4.33 x10E6/uL (ref 3.77–5.28)
RDW: 14.6 % (ref 11.7–15.4)
WBC: 11.5 10*3/uL — ABNORMAL HIGH (ref 3.4–10.8)

## 2023-04-10 LAB — VITAMIN B12: Vitamin B-12: 185 pg/mL — ABNORMAL LOW (ref 232–1245)

## 2023-04-10 NOTE — Assessment & Plan Note (Signed)
Managed with propranolol 80 mg long-acting each day.  No palpitations.  NSR today.  No positional dizziness.  Continue to monitor.

## 2023-04-10 NOTE — Assessment & Plan Note (Signed)
Ms. [place] endorses a stable mood.  She feels that her propranolol and her duloxetine help manage her anxiety.  She does not wish to adjust dose of either medicine.

## 2023-04-10 NOTE — Assessment & Plan Note (Signed)
Treated with statin and ezetimibe for primary prevention.  Had been treated exclusively with ezetimibe for some time, due to history of intolerance to statin, though she did agree to try atorvastatin which she is tolerating at 10 mg daily.  She brings package insert for ezetimibe today and does not wish to take it any longer, noted that potential side effects could be fatigue and aching, which she experiences chronically.  Doubtful the ezetimibe is responsible particularly for the fatigue and given her tolerance in the past.  Repeat lipid panel today and increase atorvastatin if needed.

## 2023-04-10 NOTE — Assessment & Plan Note (Signed)
Her most significant problem reported today: Will screen for anemia, B12 deficiency, thyroid disease, and renal and liver function.  If her blood pressures are running lower at home, there may be a component of overly controlled blood pressure.  She does need to drink more fluids.

## 2023-04-10 NOTE — Assessment & Plan Note (Signed)
History of skin cancer surgery on forehead.  Sun damaged skin over arms and face discussed with her as a concern for skin cancer risk.  She acknowledges this and does not wish to see a dermatologist.  When asked if she would except the risk of developing skin cancer she said "yes so I do not have to go to another doctor".  Dermatology referral declined at this time.  I will continue to address, though acknowledge that minimizing medical care is important to her.

## 2023-04-10 NOTE — Assessment & Plan Note (Signed)
Well-controlled 129/49 on amlodipine 10 mg and lisinopril 40 mg daily.  BMP today as she appears to be chronically volume depleted.  Pedal edema is unchanged and is suspected to be due to the higher dose amlodipine.  No decision on management until labs resulted.

## 2023-04-10 NOTE — Assessment & Plan Note (Signed)
No falls, no fractures.  Taking vitamin D supplement.  Continues to decline bisphosphonate though understands that osteoporosis is a risk factor for fracture.

## 2023-06-27 ENCOUNTER — Other Ambulatory Visit: Payer: Self-pay

## 2023-06-27 DIAGNOSIS — I10 Essential (primary) hypertension: Secondary | ICD-10-CM

## 2023-06-27 MED ORDER — LISINOPRIL 40 MG PO TABS
40.0000 mg | ORAL_TABLET | Freq: Every day | ORAL | 3 refills | Status: DC
Start: 2023-06-27 — End: 2024-06-30

## 2023-09-16 ENCOUNTER — Other Ambulatory Visit: Payer: Self-pay

## 2023-09-16 DIAGNOSIS — R000 Tachycardia, unspecified: Secondary | ICD-10-CM

## 2023-09-16 DIAGNOSIS — I10 Essential (primary) hypertension: Secondary | ICD-10-CM

## 2023-09-16 DIAGNOSIS — G250 Essential tremor: Secondary | ICD-10-CM

## 2023-09-16 MED ORDER — PROPRANOLOL HCL ER 80 MG PO CP24
80.0000 mg | ORAL_CAPSULE | Freq: Every day | ORAL | 3 refills | Status: AC
Start: 2023-09-16 — End: 2024-09-10

## 2023-09-16 MED ORDER — AMLODIPINE BESYLATE 10 MG PO TABS
10.0000 mg | ORAL_TABLET | Freq: Every day | ORAL | 3 refills | Status: AC
Start: 2023-09-16 — End: ?

## 2024-01-16 ENCOUNTER — Other Ambulatory Visit: Payer: Self-pay

## 2024-01-16 DIAGNOSIS — E7841 Elevated Lipoprotein(a): Secondary | ICD-10-CM

## 2024-01-16 MED ORDER — ATORVASTATIN CALCIUM 10 MG PO TABS
10.0000 mg | ORAL_TABLET | Freq: Every day | ORAL | 11 refills | Status: AC
Start: 2024-01-16 — End: 2024-07-14

## 2024-01-16 NOTE — Telephone Encounter (Signed)
 Medication sent to pharmacy

## 2024-02-05 NOTE — Progress Notes (Signed)
 [name] is here for routine follow-up of anxiety, tremor, HTN, among other problems listed below; .  Since last visit patient has seen no other medical providers.    83 year old Elizabeth Woodward is here for delayed follow-up, her most recent visit with me being fall 2024 with plan for 57-month recheck.  In the interim she reports that she has been doing okay.  She continues to live with her daughter, does her own shopping and cooking (daughter drops her off at the store; they enjoy eating different foods), and in general is is feeling increasingly bored and very anxious and nervous, shakes and trembles, with no particular cause. All I do is watch tv or sit on the porch.   On occasion (not every day) has noticed difficulty speaking, having problems getting her words to come out right - stutters (usually when feeling very anxious).  Indicates no problems with thinking, memory, understanding conversations, or doing tasks.  No shortness of breath, no racing heartbeat or palpitations since being on the medicine (propranolol ),  no new pain (chronic right knee pain due to remote injury), eating well and maintaining weight (has actually gradually gained weight over the past couple of years, after period of losing weight due to depression, grief, and family conflict all of which has improved). Sleeping ok except for TV. No falls.  No problems with bowel or bladder.   Having more troubles with speaking, stuttering, knows what she wants to say but can't say words.  Body shakes.  Doesn't happen every day.  Anxiety doesn't happen daily either.  She feels cooped up, is bored, trapped in house, wants an outlet, has no outlet.  Daughter drives her around.  Doesn't want to go to a senior center.  She brings her medicines today- she endorses having taken them this morning.  Patient Active Problem List   Diagnosis Date Noted   Fatigue 04/10/2023   Sun-damaged skin with history of skin cancer face  04/10/2023   Fatty liver, mild, noted on imaging 2008 04/10/2023   Chronic pain of right knee 06/22/2022   Hearing loss due to cerumen impaction, right 02/23/2021   Sinus tachycardia 03/14/2020   Essential tremor 03/14/2020   Vaginal wall prolapse 06/14/2018   Preventative health care 11/01/2011   Hyperlipidemia 06/13/2006   Anxiety and depression 06/13/2006   Essential hypertension 06/13/2006   Mild heartburn 06/13/2006   Osteoporosis 06/13/2006   Current Outpatient Medications:    amLODipine  (NORVASC ) 10 MG tablet, Take 1 tablet (10 mg total) by mouth daily with breakfast., Disp: 90 tablet, Rfl: 3   atorvastatin  (LIPITOR) 10 MG tablet, Take 1 tablet (10 mg total) by mouth daily., Disp: 30 tablet, Rfl: 11   Cholecalciferol (VITAMIN D -3 PO), Take 2 tablets by mouth daily. , Disp: , Rfl:    DULoxetine  (CYMBALTA ) 20 MG capsule, Take 1 capsule (20 mg total) by mouth daily., Disp: 90 capsule, Rfl: 3   famotidine  (PEPCID ) 20 MG tablet, Take 20 mg by mouth 2 (two) times daily. Take in AM and PM , Disp: , Rfl:    lisinopril  (ZESTRIL ) 40 MG tablet, Take 1 tablet (40 mg total) by mouth daily with supper., Disp: 90 tablet, Rfl: 3   Multiple Vitamin (MULTIVITAMIN) tablet, Take 1 tablet by mouth daily.  , Disp: , Rfl:    Omega 3-6-9 Fatty Acids (OMEGA-3 & OMEGA-6 FISH OIL PO), Take 1 tablet by mouth daily. Reported on 01/24/2016, Disp: , Rfl:    propranolol  ER (INDERAL   LA) 80 MG 24 hr capsule, Take 1 capsule (80 mg total) by mouth daily., Disp: 90 capsule, Rfl: 3   Wheat Dextrin (BENEFIBER) TABS, Take 1 tablet by mouth 2 (two) times daily. Reported on 01/24/2016, Disp: , Rfl:   Functional Status: Independent in all ADLs and IADLs except driving (daughter drives her to shop and to appointments). Lives with her daughter.  No outside activities - watches TV, sits on porch.   Objective BP (!) 135/56 (BP Location: Right Arm, Patient Position: Sitting, Cuff Size: Small)   Pulse 79   Temp 97.6 F (36.4 C)  (Oral)   Wt 116 lb (52.6 kg)   SpO2 97%   BMI 22.65 kg/m   Exam: Short habitus. Cheerful affect, talkative, nervous and somewhat fidgety.  Hearing is reduced.  Solar skin changes of erythema and scaling are significant over the face.  She is tanned in areas of sun exposure; skin is thin with reduced turgor. Palms and distal fingers are very smooth and silky with minimal texture, very pink in contrast to skin color elsewhere.  No tenderness of hand joints, fingers are not swollen, no significant nail changes.  Heart RRR; syst murmur at base, unchanged; neck veins flat lungs clear. Feet edematous, legs with superficial varicosities. Pedal skin in good condition, dp pulses 1+.  R knee is thicker than L with no active effusion.  No tremor currently.  Motor tone normal.  Gait is mildly antalgic favoring R knee (unchanged).   Addressed today:  Mild heartburn Assessment & Plan: Sxs well controlled on prn famotidine  20 mg. Orders: -     Famotidine ; Take 1 tablet (20 mg total) by mouth daily as needed for heartburn or indigestion. Take in AM and PM  B12 deficiency -     Vitamin B12  Mixed hyperlipidemia Assessment & Plan: On atorvastatin  10 mg daily (zetia  stopped in past due to her concerns about side effects on product packaging). Due for lipid panel today.  Primary prevention. Orders: -     Lipid panel  Essential hypertension Assessment & Plan: 135/56 on amlodipine  10 mg and lisinopril  40 mg which she took this morning.  Bmp to assess 'lytes and renal fxn. Orders: -     BMP8+Anion Gap  Sinus tachycardia Assessment & Plan: Managed with propranolol  80 mg long-acting each day. No palpitations. NSR today. No positional dizziness. Continue to monitor.   Essential tremor Assessment & Plan: No tremor today, though I do believe she may be experiencing tremulousness during periods of anxiety, which are often significant.  She is taking propranolol  for sinus tachycardia which may also be helpful  for the tremor.  Monitor.  No changes today.  Generalized anxiety disorder Assessment & Plan: Endorsing more anxiety than depression (the latter has improved upon moving in with her daughter some time ago, and continuing to recover from death of her son). The anxiety is not triggered by anything in particular, though she does endorse feeling cooped up, bored, with no outlet.  Anxiety is associated with trembling and with difficulty expressing herself verbally.  She reports no changes or concerns with her memory, understanding others, or doing tasks.  Does not wish to attend a senior center I'd rather not be around other people, though she desires something to do.  I'm not sure I have an answer for her at this time, but she does agree to increase her dose of duloxetine .  Ms. Trzcinski doesn't like change - she agreed to this rather than suggestion to  change the duloxetine  to another medication such as escitalopram.  We'll see how this goes.   History of non anemic vitamin B12 deficiency Assessment & Plan: Check vit B12 today.   Age-related osteoporosis without current pathological fracture Assessment & Plan: No falls, no fractures. Taking vitamin D  supplement OTC (check level today). Continues to decline bisphosphonate though understands that osteoporosis is a risk factor for fracture.   Pigmentation abnormality of skin Assessment & Plan: Distal fingers and palms of both hands with asymptomatic very smooth, pink, minimally textured skin, in sharp contrast to wrinkled tanned skin elsewhere.  No particular condition comes to mind.  It has been stable.  I'll continue to give this thought.

## 2024-02-06 ENCOUNTER — Ambulatory Visit: Payer: Self-pay | Admitting: Internal Medicine

## 2024-02-06 VITALS — BP 135/56 | HR 79 | Temp 97.6°F | Wt 116.0 lb

## 2024-02-06 DIAGNOSIS — Z8639 Personal history of other endocrine, nutritional and metabolic disease: Secondary | ICD-10-CM

## 2024-02-06 DIAGNOSIS — R000 Tachycardia, unspecified: Secondary | ICD-10-CM | POA: Diagnosis not present

## 2024-02-06 DIAGNOSIS — I10 Essential (primary) hypertension: Secondary | ICD-10-CM | POA: Diagnosis not present

## 2024-02-06 DIAGNOSIS — M810 Age-related osteoporosis without current pathological fracture: Secondary | ICD-10-CM

## 2024-02-06 DIAGNOSIS — L819 Disorder of pigmentation, unspecified: Secondary | ICD-10-CM | POA: Diagnosis not present

## 2024-02-06 DIAGNOSIS — R12 Heartburn: Secondary | ICD-10-CM | POA: Diagnosis not present

## 2024-02-06 DIAGNOSIS — F32A Depression, unspecified: Secondary | ICD-10-CM | POA: Diagnosis not present

## 2024-02-06 DIAGNOSIS — F411 Generalized anxiety disorder: Secondary | ICD-10-CM | POA: Diagnosis not present

## 2024-02-06 DIAGNOSIS — G250 Essential tremor: Secondary | ICD-10-CM

## 2024-02-06 DIAGNOSIS — E538 Deficiency of other specified B group vitamins: Secondary | ICD-10-CM | POA: Diagnosis not present

## 2024-02-06 DIAGNOSIS — E782 Mixed hyperlipidemia: Secondary | ICD-10-CM | POA: Diagnosis not present

## 2024-02-06 MED ORDER — FAMOTIDINE 20 MG PO TABS: 20.0000 mg | ORAL_TABLET | Freq: Every day | ORAL | Status: AC | PRN

## 2024-02-06 MED ORDER — DULOXETINE HCL 20 MG PO CPEP: 40.0000 mg | ORAL_CAPSULE | Freq: Every day | ORAL | 3 refills | Status: AC

## 2024-02-06 NOTE — Patient Instructions (Signed)
 Elizabeth Woodward,  I'm glad we got together today! We talked about your anxiety and shaking, and I would like for you to start taking 2 of your duloxetine  tablets, at the same time, each day.  This should help with your anxiety.  If not, we'll figure something else out!  Your blood pressure looks good today!

## 2024-02-07 LAB — BMP8+ANION GAP
Anion Gap: 19 mmol/L — ABNORMAL HIGH (ref 10.0–18.0)
BUN/Creatinine Ratio: 19 (ref 12–28)
BUN: 22 mg/dL (ref 8–27)
CO2: 17 mmol/L — ABNORMAL LOW (ref 20–29)
Calcium: 10.3 mg/dL (ref 8.7–10.3)
Chloride: 101 mmol/L (ref 96–106)
Creatinine, Ser: 1.18 mg/dL — ABNORMAL HIGH (ref 0.57–1.00)
Glucose: 110 mg/dL — ABNORMAL HIGH (ref 70–99)
Potassium: 5 mmol/L (ref 3.5–5.2)
Sodium: 137 mmol/L (ref 134–144)
eGFR: 46 mL/min/1.73 — ABNORMAL LOW (ref >59)

## 2024-02-07 LAB — LIPID PANEL
Chol/HDL Ratio: 3.8 ratio (ref 0.0–4.4)
Cholesterol, Total: 211 mg/dL — ABNORMAL HIGH (ref 100–199)
HDL: 55 mg/dL (ref >39)
LDL Chol Calc (NIH): 126 mg/dL — ABNORMAL HIGH (ref 0–99)
Triglycerides: 173 mg/dL — ABNORMAL HIGH (ref 0–149)
VLDL Cholesterol Cal: 30 mg/dL (ref 5–40)

## 2024-02-07 LAB — VITAMIN B12: Vitamin B-12: 194 pg/mL — ABNORMAL LOW (ref 232–1245)

## 2024-02-11 ENCOUNTER — Encounter: Payer: Self-pay | Admitting: Internal Medicine

## 2024-02-11 DIAGNOSIS — Z8639 Personal history of other endocrine, nutritional and metabolic disease: Secondary | ICD-10-CM | POA: Insufficient documentation

## 2024-02-11 DIAGNOSIS — L819 Disorder of pigmentation, unspecified: Secondary | ICD-10-CM | POA: Insufficient documentation

## 2024-02-11 NOTE — Assessment & Plan Note (Signed)
Check vit B12 today.

## 2024-02-11 NOTE — Assessment & Plan Note (Signed)
 On atorvastatin  10 mg daily (zetia  stopped in past due to her concerns about side effects on product packaging). Due for lipid panel today.  Primary prevention.

## 2024-02-11 NOTE — Assessment & Plan Note (Signed)
Managed with propranolol 80 mg long-acting each day.  No palpitations.  NSR today.  No positional dizziness.  Continue to monitor.

## 2024-02-11 NOTE — Assessment & Plan Note (Signed)
 Distal fingers and palms of both hands with asymptomatic very smooth, pink, minimally textured skin, in sharp contrast to wrinkled tanned skin elsewhere.  No particular condition comes to mind.  It has been stable.  I'll continue to give this thought.

## 2024-02-11 NOTE — Assessment & Plan Note (Signed)
 No tremor today, though I do believe she may be experiencing tremulousness during periods of anxiety, which are often significant.  She is taking propranolol  for sinus tachycardia which may also be helpful for the tremor.  Monitor.  No changes today.

## 2024-02-11 NOTE — Assessment & Plan Note (Signed)
 Endorsing more anxiety than depression (the latter has improved upon moving in with her daughter some time ago, and continuing to recover from death of her son). The anxiety is not triggered by anything in particular, though she does endorse feeling cooped up, bored, with no outlet.  Anxiety is associated with trembling and with difficulty expressing herself verbally.  She reports no changes or concerns with her memory, understanding others, or doing tasks.  Does not wish to attend a senior center I'd rather not be around other people, though she desires something to do.  I'm not sure I have an answer for her at this time, but she does agree to increase her dose of duloxetine .  Elizabeth Woodward doesn't like change - she agreed to this rather than suggestion to change the duloxetine  to another medication such as escitalopram.  We'll see how this goes.

## 2024-02-11 NOTE — Assessment & Plan Note (Signed)
 135/56 on amlodipine  10 mg and lisinopril  40 mg which she took this morning.  Bmp to assess 'lytes and renal fxn.

## 2024-02-11 NOTE — Assessment & Plan Note (Signed)
 No falls, no fractures. Taking vitamin D  supplement OTC (check level today). Continues to decline bisphosphonate though understands that osteoporosis is a risk factor for fracture.

## 2024-02-11 NOTE — Assessment & Plan Note (Signed)
 Sxs well controlled on prn famotidine  20 mg.

## 2024-06-30 ENCOUNTER — Other Ambulatory Visit: Payer: Self-pay

## 2024-06-30 DIAGNOSIS — I10 Essential (primary) hypertension: Secondary | ICD-10-CM

## 2024-06-30 MED ORDER — LISINOPRIL 40 MG PO TABS: 40.0000 mg | ORAL_TABLET | Freq: Every day | ORAL | 3 refills | Status: AC

## 2024-06-30 NOTE — Telephone Encounter (Signed)
 Medication sent to pharmacy
